# Patient Record
Sex: Female | Born: 1940
Health system: Southern US, Community
[De-identification: ages and names within clinical notes are randomized; demographics above are authoritative.]

## PROBLEM LIST (undated history)

## (undated) DIAGNOSIS — Z8719 Personal history of other diseases of the digestive system: Secondary | ICD-10-CM

## (undated) DIAGNOSIS — I10 Essential (primary) hypertension: Secondary | ICD-10-CM

## (undated) DIAGNOSIS — E785 Hyperlipidemia, unspecified: Secondary | ICD-10-CM

## (undated) DIAGNOSIS — M810 Age-related osteoporosis without current pathological fracture: Secondary | ICD-10-CM

## (undated) DIAGNOSIS — G4733 Obstructive sleep apnea (adult) (pediatric): Secondary | ICD-10-CM

## (undated) DIAGNOSIS — E039 Hypothyroidism, unspecified: Secondary | ICD-10-CM

## (undated) DIAGNOSIS — G4730 Sleep apnea, unspecified: Secondary | ICD-10-CM

## (undated) DIAGNOSIS — I701 Atherosclerosis of renal artery: Secondary | ICD-10-CM

## (undated) DIAGNOSIS — T7840XA Allergy, unspecified, initial encounter: Secondary | ICD-10-CM

## (undated) DIAGNOSIS — H3530 Unspecified macular degeneration: Secondary | ICD-10-CM

## (undated) HISTORY — DX: Essential (primary) hypertension: I10

## (undated) HISTORY — DX: Personal history of other diseases of the digestive system: Z87.19

## (undated) HISTORY — DX: Obstructive sleep apnea (adult) (pediatric): G47.33

## (undated) HISTORY — PX: COLONOSCOPY: SHX174

## (undated) HISTORY — DX: Age-related osteoporosis without current pathological fracture: M81.0

## (undated) HISTORY — DX: Allergy, unspecified, initial encounter: T78.40XA

## (undated) HISTORY — DX: Sleep apnea, unspecified: G47.30

## (undated) HISTORY — DX: Atherosclerosis of renal artery: I70.1

## (undated) HISTORY — DX: Unspecified macular degeneration: H35.30

## (undated) HISTORY — DX: Hyperlipidemia, unspecified: E78.5

## (undated) HISTORY — DX: Hypothyroidism, unspecified: E03.9

---
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
## 1978-09-13 DIAGNOSIS — Z8711 Personal history of peptic ulcer disease: Secondary | ICD-10-CM

## 1978-09-13 HISTORY — DX: Personal history of peptic ulcer disease: Z87.11

## 1980-09-13 HISTORY — PX: LAPAROSCOPY: SHX197

## 2005-09-13 HISTORY — PX: THYROIDECTOMY: SHX17

## 2011-11-17 DIAGNOSIS — E039 Hypothyroidism, unspecified: Secondary | ICD-10-CM | POA: Diagnosis not present

## 2011-11-17 DIAGNOSIS — R209 Unspecified disturbances of skin sensation: Secondary | ICD-10-CM | POA: Diagnosis not present

## 2011-11-17 DIAGNOSIS — E785 Hyperlipidemia, unspecified: Secondary | ICD-10-CM | POA: Diagnosis not present

## 2011-11-17 DIAGNOSIS — E559 Vitamin D deficiency, unspecified: Secondary | ICD-10-CM | POA: Diagnosis not present

## 2011-11-22 DIAGNOSIS — E559 Vitamin D deficiency, unspecified: Secondary | ICD-10-CM | POA: Diagnosis not present

## 2011-11-22 DIAGNOSIS — R799 Abnormal finding of blood chemistry, unspecified: Secondary | ICD-10-CM | POA: Diagnosis not present

## 2011-11-22 DIAGNOSIS — E785 Hyperlipidemia, unspecified: Secondary | ICD-10-CM | POA: Diagnosis not present

## 2011-11-22 DIAGNOSIS — R209 Unspecified disturbances of skin sensation: Secondary | ICD-10-CM | POA: Diagnosis not present

## 2011-12-21 DIAGNOSIS — F331 Major depressive disorder, recurrent, moderate: Secondary | ICD-10-CM | POA: Diagnosis not present

## 2012-01-12 DIAGNOSIS — F331 Major depressive disorder, recurrent, moderate: Secondary | ICD-10-CM | POA: Diagnosis not present

## 2012-01-12 DIAGNOSIS — G609 Hereditary and idiopathic neuropathy, unspecified: Secondary | ICD-10-CM | POA: Diagnosis not present

## 2012-01-19 DIAGNOSIS — F329 Major depressive disorder, single episode, unspecified: Secondary | ICD-10-CM | POA: Diagnosis not present

## 2012-01-19 DIAGNOSIS — R209 Unspecified disturbances of skin sensation: Secondary | ICD-10-CM | POA: Diagnosis not present

## 2012-01-19 DIAGNOSIS — I10 Essential (primary) hypertension: Secondary | ICD-10-CM | POA: Diagnosis not present

## 2012-01-19 DIAGNOSIS — F3289 Other specified depressive episodes: Secondary | ICD-10-CM | POA: Diagnosis not present

## 2012-01-19 DIAGNOSIS — E039 Hypothyroidism, unspecified: Secondary | ICD-10-CM | POA: Diagnosis not present

## 2012-02-02 DIAGNOSIS — E039 Hypothyroidism, unspecified: Secondary | ICD-10-CM | POA: Diagnosis not present

## 2012-02-02 DIAGNOSIS — F3289 Other specified depressive episodes: Secondary | ICD-10-CM | POA: Diagnosis not present

## 2012-02-02 DIAGNOSIS — R209 Unspecified disturbances of skin sensation: Secondary | ICD-10-CM | POA: Diagnosis not present

## 2012-02-02 DIAGNOSIS — I10 Essential (primary) hypertension: Secondary | ICD-10-CM | POA: Diagnosis not present

## 2012-02-02 DIAGNOSIS — G839 Paralytic syndrome, unspecified: Secondary | ICD-10-CM | POA: Diagnosis not present

## 2012-02-02 DIAGNOSIS — F329 Major depressive disorder, single episode, unspecified: Secondary | ICD-10-CM | POA: Diagnosis not present

## 2012-02-29 DIAGNOSIS — E559 Vitamin D deficiency, unspecified: Secondary | ICD-10-CM | POA: Diagnosis not present

## 2012-02-29 DIAGNOSIS — F331 Major depressive disorder, recurrent, moderate: Secondary | ICD-10-CM | POA: Diagnosis not present

## 2012-04-12 DIAGNOSIS — H16209 Unspecified keratoconjunctivitis, unspecified eye: Secondary | ICD-10-CM | POA: Diagnosis not present

## 2012-04-18 DIAGNOSIS — H16209 Unspecified keratoconjunctivitis, unspecified eye: Secondary | ICD-10-CM | POA: Diagnosis not present

## 2012-05-11 ENCOUNTER — Other Ambulatory Visit: Payer: Self-pay | Admitting: Family Medicine

## 2012-05-11 DIAGNOSIS — Z1231 Encounter for screening mammogram for malignant neoplasm of breast: Secondary | ICD-10-CM

## 2012-06-02 ENCOUNTER — Ambulatory Visit
Admission: RE | Admit: 2012-06-02 | Discharge: 2012-06-02 | Disposition: A | Discharge status: Home / Self Care | Payer: Medicare Other | Source: Ambulatory Visit | Attending: Family Medicine | Admitting: Family Medicine

## 2012-06-02 DIAGNOSIS — Z1231 Encounter for screening mammogram for malignant neoplasm of breast: Secondary | ICD-10-CM | POA: Diagnosis not present

## 2012-08-24 ENCOUNTER — Ambulatory Visit (INDEPENDENT_AMBULATORY_CARE_PROVIDER_SITE_OTHER): Payer: Medicare Other | Admitting: Internal Medicine

## 2012-08-24 ENCOUNTER — Encounter: Payer: Self-pay | Admitting: Internal Medicine

## 2012-08-24 VITALS — BP 150/90 | HR 86 | Temp 98.0°F | Resp 18 | Ht 63.75 in | Wt 169.0 lb

## 2012-08-24 DIAGNOSIS — Z0000 Encounter for general adult medical examination without abnormal findings: Secondary | ICD-10-CM

## 2012-08-24 DIAGNOSIS — I10 Essential (primary) hypertension: Secondary | ICD-10-CM | POA: Diagnosis not present

## 2012-08-24 DIAGNOSIS — E039 Hypothyroidism, unspecified: Secondary | ICD-10-CM

## 2012-08-24 DIAGNOSIS — E559 Vitamin D deficiency, unspecified: Secondary | ICD-10-CM

## 2012-08-24 MED ORDER — AMLODIPINE BESYLATE 2.5 MG PO TABS: 2.5000 mg | ORAL_TABLET | Freq: Every day | ORAL | Status: DC

## 2012-08-24 MED ORDER — ZOLPIDEM TARTRATE 5 MG PO TABS: 5.0000 mg | ORAL_TABLET | Freq: Every evening | ORAL | Status: DC | PRN

## 2012-08-24 MED ORDER — QUINAPRIL HCL 40 MG PO TABS: 40.0000 mg | ORAL_TABLET | Freq: Every day | ORAL | Status: DC

## 2012-08-24 MED ORDER — PRAVASTATIN SODIUM 20 MG PO TABS: 20.0000 mg | ORAL_TABLET | Freq: Every day | ORAL | Status: DC

## 2012-08-24 MED ORDER — LEVOTHYROXINE SODIUM 125 MCG PO TABS: 125.0000 ug | ORAL_TABLET | Freq: Every day | ORAL | Status: DC

## 2012-08-24 MED ORDER — HYDROCHLOROTHIAZIDE 25 MG PO TABS: 25.0000 mg | ORAL_TABLET | Freq: Every day | ORAL | Status: DC

## 2012-08-24 NOTE — Patient Instructions (Signed)
Limit your sodium (Salt) intake  You need to lose weight.  Consider a lower calorie diet and regular exercise.  Please check your blood pressure on a regular basis.  If it is consistently greater than 150/90, please make an office appointment.  Return in 3 months for follow-up  Insomnia Insomnia is frequent trouble falling and/or staying asleep. Insomnia can be a long term problem or a short term problem. Both are common. Insomnia can be a short term problem when the wakefulness is related to a certain stress or worry. Long term insomnia is often related to ongoing stress during waking hours and/or poor sleeping habits. Overtime, sleep deprivation itself can make the problem worse. Every little thing feels more severe because you are overtired and your ability to cope is decreased. CAUSES    Stress, anxiety, and depression.   Poor sleeping habits.   Distractions such as TV in the bedroom.   Naps close to bedtime.   Engaging in emotionally charged conversations before bed.   Technical reading before sleep.   Alcohol and other sedatives. They may make the problem worse. They can hurt normal sleep patterns and normal dream activity.   Stimulants such as caffeine for several hours prior to bedtime.   Pain syndromes and shortness of breath can cause insomnia.   Exercise late at night.   Changing time zones may cause sleeping problems (jet lag).  It is sometimes helpful to have someone observe your sleeping patterns. They should look for periods of not breathing during the night (sleep apnea). They should also look to see how long those periods last. If you live alone or observers are uncertain, you can also be observed at a sleep clinic where your sleep patterns will be professionally monitored. Sleep apnea requires a checkup and treatment. Give your caregivers your medical history. Give your caregivers observations your family has made about your sleep.   SYMPTOMS    Not feeling  rested in the morning.   Anxiety and restlessness at bedtime.   Difficulty falling and staying asleep.  TREATMENT    Your caregiver may prescribe treatment for an underlying medical disorders. Your caregiver can give advice or help if you are using alcohol or other drugs for self-medication. Treatment of underlying problems will usually eliminate insomnia problems.   Medications can be prescribed for short time use. They are generally not recommended for lengthy use.   Over-the-counter sleep medicines are not recommended for lengthy use. They can be habit forming.   You can promote easier sleeping by making lifestyle changes such as:   Using relaxation techniques that help with breathing and reduce muscle tension.   Exercising earlier in the day.   Changing your diet and the time of your last meal. No night time snacks.   Establish a regular time to go to bed.   Counseling can help with stressful problems and worry.   Soothing music and Dragon noise may be helpful if there are background noises you cannot remove.   Stop tedious detailed work at least one hour before bedtime.  HOME CARE INSTRUCTIONS    Keep a diary. Inform your caregiver about your progress. This includes any medication side effects. See your caregiver regularly. Take note of:   Times when you are asleep.   Times when you are awake during the night.   The quality of your sleep.   How you feel the next day.  This information will help your caregiver care for you.  Get out  of bed if you are still awake after 15 minutes. Read or do some quiet activity. Keep the lights down. Wait until you feel sleepy and go back to bed.   Keep regular sleeping and waking hours. Avoid naps.   Exercise regularly.   Avoid distractions at bedtime. Distractions include watching television or engaging in any intense or detailed activity like attempting to balance the household checkbook.   Develop a bedtime ritual. Keep a  familiar routine of bathing, brushing your teeth, climbing into bed at the same time each night, listening to soothing music. Routines increase the success of falling to sleep faster.   Use relaxation techniques. This can be using breathing and muscle tension release routines. It can also include visualizing peaceful scenes. You can also help control troubling or intruding thoughts by keeping your mind occupied with boring or repetitive thoughts like the old concept of counting sheep. You can make it more creative like imagining planting one beautiful flower after another in your backyard garden.   During your day, work to eliminate stress. When this is not possible use some of the previous suggestions to help reduce the anxiety that accompanies stressful situations.  MAKE SURE YOU:    Understand these instructions.   Will watch your condition.   Will get help right away if you are not doing well or get worse.  Document Released: 08/27/2000 Document Revised: 11/22/2011 Document Reviewed: 09/27/2007 [facility] Patient Information 2013 [place], [place].

## 2012-08-24 NOTE — Progress Notes (Signed)
Subjective:    Patient ID: Teresa Rangel, female    DOB: 12-31-1940, 71 y.o.   MRN: [id number]  HPI 70 year old patient who is seen today to set [place] with our practice. She has long history of hypertension that has been controlled on 2 drugs. She states that blood pressure readings especially systolics have been consistently elevated. She states that she has a remote history of fibromyalgia and restless leg syndrome he complains of intermittent insomnia. She also complains of dizziness and nasal stuffiness. Dizziness sounds like brief vertigo aggravated by head turning. Past medical history she has had thyroid surgery for benign tumor and a remote appendectomy in 1955 she had a colonoscopy 7 years ago. She has remote history depression.  Family history father died at 29 complications of COPD and coronary artery disease Mother died at 66. In good health and died in a motor vehicle accident. Paternal grandmother with diabetes one brother in good health one sister with cerebrovascular disease  Social history relocated from [place] about one year ago. Retired the high school Warden/ranger. Nonsmoker  1. Risk factors, based on past  M,S,F history-  cardiovascular risk factors include a history of hypertension 2.  Physical activities: Remains quite active does supper to spread add a health club 3 times weekly  3.  Depression/mood: Remote history depression has been on Zoloft briefly in the past  4.  Hearing: No deficits  5.  ADL's: Independent in all aspects of daily living  6.  Fall risk: Low   7.  Home safety: No problems identified  8.  Height weight, and visual acuity; height and weight stable no change in visual acuity  9.  Counseling: Not appropriate at this time  10. Lab orders based on risk factors: Will review in the spring  11. Referral : Not appropriate at this time  12. Care plan: We'll add amlodipine to her blood pressure regimen continue home blood pressure monitoring 13.  Cognitive assessment: Alert and oriented normal affect. No cognitive dysfunction    Review of Systems  Constitutional: Negative for fever, appetite change, fatigue and unexpected weight change.  HENT: Positive for congestion and sinus pressure. Negative for hearing loss, ear pain, nosebleeds, sore throat, mouth sores, trouble swallowing, neck stiffness, dental problem, voice change and tinnitus.   Eyes: Negative for photophobia, pain, redness and visual disturbance.  Respiratory: Negative for cough, chest tightness and shortness of breath.   Cardiovascular: Negative for chest pain, palpitations and leg swelling.  Gastrointestinal: Negative for nausea, vomiting, abdominal pain, diarrhea, constipation, blood in stool, abdominal distention and rectal pain.  Genitourinary: Negative for dysuria, urgency, frequency, hematuria, flank pain, vaginal bleeding, vaginal discharge, difficulty urinating, genital sores, vaginal pain, menstrual problem and pelvic pain.  Musculoskeletal: Negative for back pain and arthralgias.  Skin: Negative for rash.  Neurological: Positive for light-headedness. Negative for dizziness, syncope, speech difficulty, weakness, numbness and headaches.  Hematological: Negative for adenopathy. Does not bruise/bleed easily.  Psychiatric/Behavioral: Positive for sleep disturbance. Negative for suicidal ideas, behavioral problems, self-injury, dysphoric mood and agitation. The patient is not nervous/anxious.        Objective:   Physical Exam  Constitutional: She is oriented to person, place, and time. She appears well-developed and well-nourished.  HENT:  Head: Normocephalic.  Right Ear: External ear normal.  Left Ear: External ear normal.  Mouth/Throat: Oropharynx is clear and moist.  Eyes: Conjunctivae normal and EOM are normal. Pupils are equal, round, and reactive to light.  Neck: Normal range of motion.  Neck supple. No thyromegaly present.       Thyroidectomy scar   Cardiovascular: Normal rate, regular rhythm, normal heart sounds and intact distal pulses.   Pulmonary/Chest: Effort normal and breath sounds normal.  Abdominal: Soft. Bowel sounds are normal. She exhibits no mass. There is no tenderness.  Musculoskeletal: Normal range of motion.  Lymphadenopathy:    She has no cervical adenopathy.  Neurological: She is alert and oriented to person, place, and time.  Skin: Skin is warm and dry. No rash noted.  Psychiatric: She has a normal mood and affect. Her behavior is normal.          Assessment & Plan:  Preventive health exam Hypertension suboptimal control we'll add amlodipine 2.5 mg daily Intermittent vertigo in the setting of nasal congestion will give a trial of Nasacort. Insomnia. This is episodic will treat with the Ambien 5 when necessary  Recheck 3 months. Laboratory update at that time home blood pressure monitoring encouraged

## 2012-10-18 ENCOUNTER — Telehealth: Payer: Self-pay | Admitting: *Deleted

## 2012-10-18 NOTE — Telephone Encounter (Signed)
Left message on voicemail. Received request from pharmacy [facility] to change Quinapril to Lisinopril due to cost.

## 2012-10-19 NOTE — Telephone Encounter (Signed)
Spoke to pt stated she does not need to change medication, the pharmacist was wrong she got Quinapril Rx. Told pt okay.

## 2012-11-22 ENCOUNTER — Ambulatory Visit: Payer: PRIVATE HEALTH INSURANCE | Admitting: Internal Medicine

## 2012-12-07 ENCOUNTER — Ambulatory Visit (INDEPENDENT_AMBULATORY_CARE_PROVIDER_SITE_OTHER): Payer: Medicare Other | Admitting: Internal Medicine

## 2012-12-07 ENCOUNTER — Encounter: Payer: Self-pay | Admitting: Internal Medicine

## 2012-12-07 VITALS — BP 150/90 | HR 79 | Temp 98.6°F | Resp 18 | Wt 175.0 lb

## 2012-12-07 DIAGNOSIS — I10 Essential (primary) hypertension: Secondary | ICD-10-CM | POA: Diagnosis not present

## 2012-12-07 DIAGNOSIS — E785 Hyperlipidemia, unspecified: Secondary | ICD-10-CM | POA: Diagnosis not present

## 2012-12-07 DIAGNOSIS — E039 Hypothyroidism, unspecified: Secondary | ICD-10-CM

## 2012-12-07 LAB — CBC WITH DIFFERENTIAL/PLATELET
Basophils Absolute: 0 10*3/uL (ref 0.0–0.1)
Eosinophils Absolute: 0.3 10*3/uL (ref 0.0–0.7)
HCT: 43.4 % (ref 36.0–46.0)
Lymphs Abs: 1.7 10*3/uL (ref 0.7–4.0)
MCHC: 34.3 g/dL (ref 30.0–36.0)
Monocytes Absolute: 0.5 10*3/uL (ref 0.1–1.0)
Monocytes Relative: 8.4 % (ref 3.0–12.0)
Platelets: 235 10*3/uL (ref 150.0–400.0)
RDW: 12.3 % (ref 11.5–14.6)

## 2012-12-07 LAB — LDL CHOLESTEROL, DIRECT: Direct LDL: 133.1 mg/dL

## 2012-12-07 LAB — LIPID PANEL
HDL: 50.6 mg/dL (ref >39.00)
Total CHOL/HDL Ratio: 4
VLDL: 33.2 mg/dL (ref 0.0–40.0)

## 2012-12-07 LAB — COMPREHENSIVE METABOLIC PANEL
ALT: 20 U/L (ref 0–35)
CO2: 30 mEq/L (ref 19–32)
Chloride: 100 mEq/L (ref 96–112)
GFR: 80.53 mL/min (ref >60.00)
Sodium: 137 mEq/L (ref 135–145)
Total Bilirubin: 0.8 mg/dL (ref 0.3–1.2)
Total Protein: 7.1 g/dL (ref 6.0–8.3)

## 2012-12-07 MED ORDER — ZOLPIDEM TARTRATE 5 MG PO TABS: 5.0000 mg | ORAL_TABLET | Freq: Every evening | ORAL | Status: DC | PRN

## 2012-12-07 MED ORDER — HYDROCHLOROTHIAZIDE 25 MG PO TABS: 25.0000 mg | ORAL_TABLET | Freq: Every day | ORAL | Status: DC

## 2012-12-07 MED ORDER — AMLODIPINE BESYLATE 5 MG PO TABS: 5.0000 mg | ORAL_TABLET | Freq: Every day | ORAL | Status: DC

## 2012-12-07 MED ORDER — QUINAPRIL HCL 40 MG PO TABS: 40.0000 mg | ORAL_TABLET | Freq: Every day | ORAL | Status: DC

## 2012-12-07 MED ORDER — PRAVASTATIN SODIUM 20 MG PO TABS: 20.0000 mg | ORAL_TABLET | Freq: Every day | ORAL | Status: DC

## 2012-12-07 MED ORDER — LEVOTHYROXINE SODIUM 125 MCG PO TABS: 125.0000 ug | ORAL_TABLET | Freq: Every day | ORAL | Status: DC

## 2012-12-07 NOTE — Patient Instructions (Signed)
Take a calcium supplement, plus [phone] units of vitamin D    It is important that you exercise regularly, at least 20 minutes 3 to 4 times per week.  If you develop chest pain or shortness of breath seek  medical attention.  Limit your sodium (Salt) intake  Please check your blood pressure on a regular basis.  If it is consistently greater than 150/90, please make an office appointment.

## 2012-12-07 NOTE — Progress Notes (Signed)
Subjective:    Patient ID: Teresa Rangel, female    DOB: May 08, 1941, 72 y.o.   MRN: [id number]  HPI  72 year old patient who is seen today for followup. Medical problems include hypertension. Last visit her blood pressure was mildly elevated and amlodipine 2.5 mg added to her regimen. Blood pressure still in the 150/90 range. She is also on hydrochlorothiazide and Accupril. She has insomnia which has done much better with when necessary use of Ambien. She has hypothyroidism and is status post partial thyroidectomy for benign tumor. She has a history of vitamin D deficiency  History reviewed. No pertinent past medical history.  History   Social History  . Marital Status: Married    Spouse Name: N/A    Number of Children: N/A  . Years of Education: N/A   Occupational History  . Not on file.   Social History Main Topics  . Smoking status: Never Smoker   . Smokeless tobacco: Never Used  . Alcohol Use: 0.6 oz/week    1 Glasses of wine per week  . Drug Use: No  . Sexually Active: Yes -- Female partner(s)   Other Topics Concern  . Not on file   Social History Narrative  . No narrative on file    History reviewed. No pertinent past surgical history.  No family history on file.  No Known Allergies  Current Outpatient Prescriptions on File Prior to Visit  Medication Sig Dispense Refill  . amLODipine (NORVASC) 2.5 MG tablet Take 1 tablet (2.5 mg total) by mouth daily.  90 tablet  3  . cholecalciferol (VITAMIN D) 1000 UNITS tablet Take 2,000 Units by mouth daily.      . hydrochlorothiazide (HYDRODIURIL) 25 MG tablet Take 1 tablet (25 mg total) by mouth daily.  90 tablet  6  . levothyroxine (SYNTHROID, LEVOTHROID) 125 MCG tablet Take 1 tablet (125 mcg total) by mouth daily.  90 tablet  6  . pravastatin (PRAVACHOL) 20 MG tablet Take 1 tablet (20 mg total) by mouth daily.  90 tablet  6  . quinapril (ACCUPRIL) 40 MG tablet Take 1 tablet (40 mg total) by mouth at bedtime.  90 tablet  6    No current facility-administered medications on file prior to visit.    BP 150/90  Pulse 79  Temp(Src) 98.6 F (37 C) (Oral)  Resp 18  Wt 175 lb (79.379 kg)  BMI 30.28 kg/m2  SpO2 97%       Review of Systems  Constitutional: Negative.   HENT: Negative for hearing loss, congestion, sore throat, rhinorrhea, dental problem, sinus pressure and tinnitus.   Eyes: Negative for pain, discharge and visual disturbance.  Respiratory: Negative for cough and shortness of breath.   Cardiovascular: Negative for chest pain, palpitations and leg swelling.  Gastrointestinal: Negative for nausea, vomiting, abdominal pain, diarrhea, constipation, blood in stool and abdominal distention.  Genitourinary: Negative for dysuria, urgency, frequency, hematuria, flank pain, vaginal bleeding, vaginal discharge, difficulty urinating, vaginal pain and pelvic pain.  Musculoskeletal: Negative for joint swelling, arthralgias and gait problem.  Skin: Negative for rash.  Neurological: Negative for dizziness, syncope, speech difficulty, weakness, numbness and headaches.  Hematological: Negative for adenopathy.  Psychiatric/Behavioral: Negative for behavioral problems, dysphoric mood and agitation. The patient is not nervous/anxious.        Objective:   Physical Exam  Constitutional: She is oriented to person, place, and time. She appears well-developed and well-nourished.  HENT:  Head: Normocephalic.  Right Ear: External ear normal.  Left  Ear: External ear normal.  Mouth/Throat: Oropharynx is clear and moist.  Eyes: Conjunctivae and EOM are normal. Pupils are equal, round, and reactive to light.  Neck: Normal range of motion. Neck supple. No thyromegaly present.  Thyroidectomy scar  Cardiovascular: Normal rate, regular rhythm, normal heart sounds and intact distal pulses.   Pulmonary/Chest: Effort normal and breath sounds normal.  Abdominal: Soft. Bowel sounds are normal. She exhibits no mass. There is  no tenderness.  Musculoskeletal: Normal range of motion.  Lymphadenopathy:    She has no cervical adenopathy.  Neurological: She is alert and oriented to person, place, and time.  Skin: Skin is warm and dry. No rash noted.  Psychiatric: She has a normal mood and affect. Her behavior is normal.          Assessment & Plan:   Hypertension. Blood pressure still mildly elevated. We'll increase amlodipine to 5 mg daily Hypothyroidism. We'll check a TSH Dyslipidemia. A lipid profile will be reviewed Insomnia improved  All medications refilled

## 2013-01-01 ENCOUNTER — Encounter (HOSPITAL_COMMUNITY): Payer: Self-pay

## 2013-01-01 ENCOUNTER — Emergency Department (HOSPITAL_COMMUNITY)
Admission: EM | Admit: 2013-01-01 | Discharge: 2013-01-01 | Disposition: A | Discharge status: Home / Self Care | Payer: Medicare Other | Attending: Emergency Medicine | Admitting: Emergency Medicine

## 2013-01-01 ENCOUNTER — Other Ambulatory Visit: Payer: Self-pay

## 2013-01-01 ENCOUNTER — Emergency Department (HOSPITAL_COMMUNITY): Payer: Medicare Other

## 2013-01-01 DIAGNOSIS — K7689 Other specified diseases of liver: Secondary | ICD-10-CM | POA: Diagnosis not present

## 2013-01-01 DIAGNOSIS — R112 Nausea with vomiting, unspecified: Secondary | ICD-10-CM | POA: Diagnosis not present

## 2013-01-01 DIAGNOSIS — R1013 Epigastric pain: Secondary | ICD-10-CM | POA: Diagnosis not present

## 2013-01-01 DIAGNOSIS — R079 Chest pain, unspecified: Secondary | ICD-10-CM | POA: Diagnosis not present

## 2013-01-01 DIAGNOSIS — E039 Hypothyroidism, unspecified: Secondary | ICD-10-CM | POA: Insufficient documentation

## 2013-01-01 DIAGNOSIS — E785 Hyperlipidemia, unspecified: Secondary | ICD-10-CM | POA: Insufficient documentation

## 2013-01-01 DIAGNOSIS — Z79899 Other long term (current) drug therapy: Secondary | ICD-10-CM | POA: Diagnosis not present

## 2013-01-01 DIAGNOSIS — Z9089 Acquired absence of other organs: Secondary | ICD-10-CM | POA: Insufficient documentation

## 2013-01-01 DIAGNOSIS — I10 Essential (primary) hypertension: Secondary | ICD-10-CM | POA: Insufficient documentation

## 2013-01-01 LAB — CBC
HCT: 43.9 % (ref 36.0–46.0)
Hemoglobin: 15.5 g/dL — ABNORMAL HIGH (ref 12.0–15.0)
MCH: 29.2 pg (ref 26.0–34.0)
MCHC: 35.3 g/dL (ref 30.0–36.0)
MCV: 82.8 fL (ref 78.0–100.0)
Platelets: 211 K/uL (ref 150–400)
RBC: 5.3 MIL/uL — ABNORMAL HIGH (ref 3.87–5.11)
RDW: 12.3 % (ref 11.5–15.5)
WBC: 6.7 10*3/uL (ref 4.0–10.5)

## 2013-01-01 LAB — BASIC METABOLIC PANEL
CO2: 28 mEq/L (ref 19–32)
Calcium: 9.8 mg/dL (ref 8.4–10.5)
Chloride: 103 mEq/L (ref 96–112)
Creatinine, Ser: 0.76 mg/dL (ref 0.50–1.10)
Glucose, Bld: 133 mg/dL — ABNORMAL HIGH (ref 70–99)
Sodium: 141 mEq/L (ref 135–145)

## 2013-01-01 LAB — HEPATIC FUNCTION PANEL
ALT: 21 U/L (ref 0–35)
AST: 22 U/L (ref 0–37)
Albumin: 3.8 g/dL (ref 3.5–5.2)
Alkaline Phosphatase: 80 U/L (ref 39–117)
Bilirubin, Direct: <0.1 mg/dL (ref 0.0–0.3)
Total Bilirubin: 0.4 mg/dL (ref 0.3–1.2)
Total Protein: 7.1 g/dL (ref 6.0–8.3)

## 2013-01-01 LAB — BASIC METABOLIC PANEL WITH GFR
BUN: 13 mg/dL (ref 6–23)
GFR calc Af Amer: >90 mL/min (ref >90)
GFR calc non Af Amer: 82 mL/min — ABNORMAL LOW (ref >90)
Potassium: 3.1 meq/L — ABNORMAL LOW (ref 3.5–5.1)

## 2013-01-01 LAB — POCT I-STAT TROPONIN I: Troponin i, poc: 0.02 ng/mL (ref 0.00–0.08)

## 2013-01-01 LAB — PRO B NATRIURETIC PEPTIDE: Pro B Natriuretic peptide (BNP): 77.3 pg/mL (ref 0–125)

## 2013-01-01 LAB — LIPASE, BLOOD: Lipase: 51 U/L (ref 11–59)

## 2013-01-01 MED ORDER — HYDROMORPHONE HCL PF 1 MG/ML IJ SOLN
0.5000 mg | Freq: Once | INTRAMUSCULAR | Status: AC
  Administered 2013-01-01: 01:00:00 via INTRAVENOUS
  Filled 2013-01-01: qty 1

## 2013-01-01 MED ORDER — ASPIRIN 325 MG PO TABS: 325.0000 mg | ORAL_TABLET | ORAL | Status: DC

## 2013-01-01 MED ORDER — ONDANSETRON HCL 4 MG/2ML IJ SOLN
4.0000 mg | Freq: Once | INTRAMUSCULAR | Status: AC
  Administered 2013-01-01: 4 mg via INTRAVENOUS
  Filled 2013-01-01: qty 2

## 2013-01-01 MED ORDER — PROMETHAZINE HCL 25 MG PO TABS: 25.0000 mg | ORAL_TABLET | Freq: Four times a day (QID) | ORAL | Status: DC | PRN

## 2013-01-01 MED ORDER — NITROGLYCERIN 0.4 MG SL SUBL: 0.4000 mg | SUBLINGUAL_TABLET | SUBLINGUAL | Status: DC | PRN

## 2013-01-01 NOTE — ED Notes (Signed)
Pt to the ED with c/o chest discomfort for the past two hours. Pt was awoken from sleep, pain is non-radiating. Pt reports shortness of breath, nausea, denies diaphoresis. Pt stats her blood pressure was 192/98 at home. Pt is compliant with blood pressure medications. Pt's respirations are equal and unlabored, skin warm and dry, and pt is A&Ox4.

## 2013-01-01 NOTE — ED Provider Notes (Signed)
History     CSN: [id number]  Arrival date & time 01/01/13  0036   First MD Initiated Contact with Patient 01/01/13 0054      Chief Complaint  Patient presents with  . Chest Pain    (Consider location/radiation/quality/duration/timing/severity/associated sxs/prior treatment) HPI Comments: Patient reports that she did eat a lot of lunch and dinner occurring at both 1:00 and at 7:30 this evening. She reports she went to bed at approximately 10:30 PM feeling rather bloated and a little bit uncomfortable in her abdomen. She woke up about an hour later with pretty significant severe pain in her upper epigastrium and in her middle anterior chest wall. She denies any significant shortness of breath, did get nauseated without any vomiting. She reports she feels like she needs to vomit to feel better. She denies skin rash, recent cough or cold symptoms. She rep reports a history of appendectomy when she was 72 years old. She denies recent diarrhea, constipation, blood in her stool. She denies prior history of cardiac disease. She does not feel faint, denies dizziness, did not have an unusual sweats. She denies any alcohol use this evening. She has significant history of hypertension, hypothyroidism, hyperlipidemia. Level V caveat due to patient condition  Patient is a 72 y.o. female presenting with chest pain. The history is provided by the patient.  Chest Pain   No past medical history on file.  No past surgical history on file.  No family history on file.  History  Substance Use Topics  . Smoking status: Never Smoker   . Smokeless tobacco: Never Used  . Alcohol Use: 0.6 oz/week    1 Glasses of wine per week    OB History   Grav Para Term Preterm Abortions TAB SAB Ect Mult Living                  Review of Systems  Unable to perform ROS: Acuity of condition  Cardiovascular: Positive for chest pain.    Allergies  Review of patient's allergies indicates no known allergies.  Home  Medications   Current Outpatient Rx  Name  Route  Sig  Dispense  Refill  . amLODipine (NORVASC) 5 MG tablet   Oral   Take 1 tablet (5 mg total) by mouth daily.   90 tablet   3   . cholecalciferol (VITAMIN D) 1000 UNITS tablet   Oral   Take 2,000 Units by mouth daily.         . hydrochlorothiazide (HYDRODIURIL) 25 MG tablet   Oral   Take 1 tablet (25 mg total) by mouth daily.   90 tablet   6   . levothyroxine (SYNTHROID, LEVOTHROID) 125 MCG tablet   Oral   Take 1 tablet (125 mcg total) by mouth daily.   90 tablet   6   . pravastatin (PRAVACHOL) 20 MG tablet   Oral   Take 1 tablet (20 mg total) by mouth daily.   90 tablet   6   . quinapril (ACCUPRIL) 40 MG tablet   Oral   Take 1 tablet (40 mg total) by mouth at bedtime.   90 tablet   6   . vitamin C (ASCORBIC ACID) 500 MG tablet   Oral   Take 500 mg by mouth daily.         Marland Kitchen zolpidem (AMBIEN) 5 MG tablet   Oral   Take 1 tablet (5 mg total) by mouth at bedtime as needed for sleep.   15  tablet   3   . promethazine (PHENERGAN) 25 MG tablet   Oral   Take 1 tablet (25 mg total) by mouth every 6 (six) hours as needed for nausea.   20 tablet   0     BP 181/76  Pulse 82  Temp(Src) 97.6 F (36.4 C) (Oral)  Resp 18  Ht 5\' 4"  (1.626 m)  Wt 169 lb (76.658 kg)  BMI 28.99 kg/m2  SpO2 100%  Physical Exam  Nursing note and vitals reviewed. Constitutional: She appears well-developed and well-nourished. She appears listless.  Non-toxic appearance. She appears distressed.  Minimally clammy in appearance. Patient prefers to lay still, speech quietly with eyes closed  HENT:  Head: Normocephalic and atraumatic.  Eyes: EOM are normal.  Neck: Normal range of motion. Neck supple.  Cardiovascular: Normal rate and regular rhythm.   No murmur heard. Pulmonary/Chest: Effort normal. No respiratory distress.  Abdominal: Soft. Normal appearance. Bowel sounds are decreased. There is tenderness in the right upper quadrant  and epigastric area. There is rebound and guarding. There is no tenderness at McBurney's point.  Neurological: She appears listless.  Skin: Skin is warm.    ED Course  Procedures (including critical care time)  Labs Reviewed  CBC - Abnormal; Notable for the following:    RBC 5.30 (*)    Hemoglobin 15.5 (*)    All other components within normal limits  BASIC METABOLIC PANEL - Abnormal; Notable for the following:    Potassium 3.1 (*)    Glucose, Bld 133 (*)    GFR calc non Af Amer 82 (*)    All other components within normal limits  PRO B NATRIURETIC PEPTIDE  HEPATIC FUNCTION PANEL  LIPASE, BLOOD  POCT I-STAT TROPONIN I   US Abdomen Complete  01/01/2013  *RADIOLOGY REPORT*  Clinical Data:  Abdominal pain  COMPLETE ABDOMINAL ULTRASOUND  Comparison:  None.  Findings:  Gallbladder:  No gallstones, gallbladder wall thickening, or pericholecystic fluid.  Common bile duct:  Poorly visualized due to bowel gas artifact. Proximally measures 5 mm.  Liver:  Increase in echogenicity.  Focal lesion detection is limited in the setting.  IVC:  Appears normal.  Pancreas:  Poorly visualized due to bowel gas artifact/limited acoustic windows.  Spleen:  Measures 11 cm.  No focal abnormality.  Right Kidney:  Measures 11 cm.  No hydronephrosis or focal abnormality.  Left Kidney:  Measures 11 cm.  No hydronephrosis or focal abnormality.  Abdominal aorta:  Portions of the aorta are incompletely visualized due to limited acoustic windows.  IMPRESSION: Technically challenging examination due to limited acoustic windows/bowel gas artifact, including incomplete visualization of the CBD, pancreas, and aorta.  No gallstones or sonographic evidence for cholecystitis.  Hepatic steatosis.   Original Report Authenticated By: Jearld Lesch, M.D.    Dg Chest Port 1 View  01/01/2013  *RADIOLOGY REPORT*  Clinical Data: Chest pain  PORTABLE CHEST - 1 VIEW  Comparison: None.  Findings: Lungs predominately clear.  No pleural  effusion or pneumothorax.  Mild aortic tortuosity and atherosclerosis.  Heart size within normal range. Surgical clips project over the midline thoracic inlet/neck.  No acute osseous finding.  IMPRESSION: No radiographic evidence of acute cardiopulmonary process.  Consider PA and lateral radiograph follow-up when the patient can tolerate for better characterization.   Original Report Authenticated By: Jearld Lesch, M.D.      1. Epigastric abdominal pain   2. Nausea and vomiting in adult     Room air  saturation is 100% and I interpret this to be normal.  ECG at time 00:41, sinus rhythm at a rate of 86, normal axis, normal intervals, no ST or T-wave abnormalities.   3:23 AM Pt reports she did vomit soon after I saw her, and that led to her feelign much improved.  She has no abd pain, no CP currently.  Troponin was neg, U/S showed no acute biliary.  CXR showed no infiltrates, no free air.  Pt's BP is normal, no guard or rebound on exam.  Pt would like to go home. MDM   Patient ate heavily today, developed acute upper epigastric discomfort that radiates into her chest. She has a very tender upper epigastrium, right upper quadrant. I suspect biliary colic as etiology. She has no prior history of coronary disease, normal EKG, and significantly reproducible tenderness in her upper epigastrium making acute coronary syndrome very unlikely. Plan is to treat symptoms, obtain ultrasound and continue to monitor.        Gavin Pound. Oletta Lamas, MD 01/01/13 [phone]

## 2013-01-01 NOTE — ED Notes (Signed)
Ultrasound at bedside

## 2013-01-06 ENCOUNTER — Encounter: Payer: Self-pay | Admitting: Internal Medicine

## 2013-01-08 ENCOUNTER — Encounter: Payer: Self-pay | Admitting: Internal Medicine

## 2013-01-08 ENCOUNTER — Telehealth: Payer: Self-pay | Admitting: Internal Medicine

## 2013-01-08 ENCOUNTER — Ambulatory Visit (INDEPENDENT_AMBULATORY_CARE_PROVIDER_SITE_OTHER): Payer: Medicare Other | Admitting: Internal Medicine

## 2013-01-08 VITALS — BP 150/82 | HR 87 | Temp 98.4°F | Resp 18 | Wt 177.0 lb

## 2013-01-08 DIAGNOSIS — E039 Hypothyroidism, unspecified: Secondary | ICD-10-CM | POA: Diagnosis not present

## 2013-01-08 DIAGNOSIS — R1013 Epigastric pain: Secondary | ICD-10-CM

## 2013-01-08 DIAGNOSIS — I10 Essential (primary) hypertension: Secondary | ICD-10-CM

## 2013-01-08 DIAGNOSIS — Z1211 Encounter for screening for malignant neoplasm of colon: Secondary | ICD-10-CM

## 2013-01-08 NOTE — Telephone Encounter (Signed)
Pt notified referral for GI done for Colonoscopy and someone will contact her.

## 2013-01-08 NOTE — Telephone Encounter (Signed)
ok 

## 2013-01-08 NOTE — Telephone Encounter (Signed)
Patient called stating that she would like a referral for a colonoscopy. Please assist.

## 2013-01-08 NOTE — Progress Notes (Signed)
Subjective:    Patient ID: Teresa Rangel, female    DOB: [birth year], 72 y.o.   MRN: [id number]  HPI  72 year old patient who is seen today following an ED visit one week ago. She presented with abdominal fullness and pain. An abdominal ultrasound was unremarkable. Over the past week she has done much better with only mild epigastric discomfort. She has been on a light diet. No significant pain nausea or vomiting. She has been using Zantac twice daily. She states that she does have a remote history of peptic ulcer disease years ago. No recurrent nausea or vomiting she has treated hypertension and hypothyroidism post thyroidectomy  ED records and x-rays reviewed  History reviewed. No pertinent past medical history.  History   Social History  . Marital Status: Married    Spouse Name: N/A    Number of Children: N/A  . Years of Education: N/A   Occupational History  . Not on file.   Social History Main Topics  . Smoking status: Never Smoker   . Smokeless tobacco: Never Used  . Alcohol Use: 0.6 oz/week    1 Glasses of wine per week  . Drug Use: No  . Sexually Active: Yes -- Female partner(s)   Other Topics Concern  . Not on file   Social History Narrative  . No narrative on file    History reviewed. No pertinent past surgical history.  No family history on file.  No Known Allergies  Current Outpatient Prescriptions on File Prior to Visit  Medication Sig Dispense Refill  . amLODipine (NORVASC) 5 MG tablet Take 1 tablet (5 mg total) by mouth daily.  90 tablet  3  . cholecalciferol (VITAMIN D) 1000 UNITS tablet Take 2,000 Units by mouth daily.      . hydrochlorothiazide (HYDRODIURIL) 25 MG tablet Take 1 tablet (25 mg total) by mouth daily.  90 tablet  6  . levothyroxine (SYNTHROID, LEVOTHROID) 125 MCG tablet Take 1 tablet (125 mcg total) by mouth daily.  90 tablet  6  . pravastatin (PRAVACHOL) 20 MG tablet Take 1 tablet (20 mg total) by mouth daily.  90 tablet  6  .  promethazine (PHENERGAN) 25 MG tablet Take 1 tablet (25 mg total) by mouth every 6 (six) hours as needed for nausea.  20 tablet  0  . quinapril (ACCUPRIL) 40 MG tablet Take 1 tablet (40 mg total) by mouth at bedtime.  90 tablet  6  . vitamin C (ASCORBIC ACID) 500 MG tablet Take 500 mg by mouth daily.      Marland Kitchen zolpidem (AMBIEN) 5 MG tablet Take 1 tablet (5 mg total) by mouth at bedtime as needed for sleep.  15 tablet  3   No current facility-administered medications on file prior to visit.    BP 150/82  Pulse 87  Temp(Src) 98.4 F (36.9 C) (Oral)  Resp 18  Wt 177 lb (80.287 kg)  BMI 30.37 kg/m2  SpO2 97%       Review of Systems  Constitutional: Negative.   HENT: Negative for hearing loss, congestion, sore throat, rhinorrhea, dental problem, sinus pressure and tinnitus.   Eyes: Negative for pain, discharge and visual disturbance.  Respiratory: Negative for cough and shortness of breath.   Cardiovascular: Negative for chest pain, palpitations and leg swelling.  Gastrointestinal: Positive for abdominal pain. Negative for nausea, vomiting, diarrhea, constipation, blood in stool and abdominal distention.  Genitourinary: Negative for dysuria, urgency, frequency, hematuria, flank pain, vaginal bleeding, vaginal discharge, difficulty urinating,  vaginal pain and pelvic pain.  Musculoskeletal: Negative for joint swelling, arthralgias and gait problem.       Pain and swelling involving the small joints of the hands  Skin: Negative for rash.  Neurological: Negative for dizziness, syncope, speech difficulty, weakness, numbness and headaches.  Hematological: Negative for adenopathy.  Psychiatric/Behavioral: Negative for behavioral problems, dysphoric mood and agitation. The patient is not nervous/anxious.        Objective:   Physical Exam  Constitutional: She is oriented to person, place, and time. She appears well-developed and well-nourished.  HENT:  Head: Normocephalic.  Right Ear:  External ear normal.  Left Ear: External ear normal.  Mouth/Throat: Oropharynx is clear and moist.  Eyes: Conjunctivae and EOM are normal. Pupils are equal, round, and reactive to light.  Neck: Normal range of motion. Neck supple. No thyromegaly present.  Cardiovascular: Normal rate, regular rhythm, normal heart sounds and intact distal pulses.   Pulmonary/Chest: Effort normal and breath sounds normal.  Abdominal: Soft. Bowel sounds are normal. She exhibits no mass. There is no tenderness.  Very mild epigastric tenderness  Musculoskeletal: Normal range of motion.  Lymphadenopathy:    She has no cervical adenopathy.  Neurological: She is alert and oriented to person, place, and time.  Skin: Skin is warm and dry. No rash noted.  Psychiatric: She has a normal mood and affect. Her behavior is normal.          Assessment & Plan:   Epigastric pain. Improved on H2 blocker therapy and light diet. Will continue The patient has been scheduled for a followup colonoscopy. At the time of her examination last year she stated that her last colonoscopy was 7 years ago. She will review her records and will cancel colonoscopy if less than 10 years

## 2013-01-08 NOTE — Patient Instructions (Signed)
Avoids foods high in acid such as tomatoes citrus juices, and spicy foods.  Avoid eating within two hours of lying down or before exercising.  Do not overheat.  Try smaller more frequent meals.  If symptoms persist, elevate the head of her bed 12 inches while sleeping.  Return in 6 months for follow-up  Call or return to clinic prn if these symptoms worsen or fail to improve as anticipated.

## 2013-01-16 ENCOUNTER — Encounter: Payer: Self-pay | Admitting: Internal Medicine

## 2013-02-11 DIAGNOSIS — L218 Other seborrheic dermatitis: Secondary | ICD-10-CM | POA: Diagnosis not present

## 2013-02-11 DIAGNOSIS — L679 Hair color and hair shaft abnormality, unspecified: Secondary | ICD-10-CM | POA: Diagnosis not present

## 2013-02-14 ENCOUNTER — Ambulatory Visit (AMBULATORY_SURGERY_CENTER): Payer: Medicare Other | Admitting: *Deleted

## 2013-02-14 VITALS — Ht 65.0 in | Wt 176.6 lb

## 2013-02-14 DIAGNOSIS — Z1211 Encounter for screening for malignant neoplasm of colon: Secondary | ICD-10-CM

## 2013-02-14 MED ORDER — NA SULFATE-K SULFATE-MG SULF 17.5-3.13-1.6 GM/177ML PO SOLN: ORAL | Status: DC

## 2013-02-27 ENCOUNTER — Encounter: Payer: Self-pay | Admitting: Internal Medicine

## 2013-02-27 ENCOUNTER — Ambulatory Visit (AMBULATORY_SURGERY_CENTER): Payer: Medicare Other | Admitting: Internal Medicine

## 2013-02-27 VITALS — BP 146/77 | HR 68 | Temp 98.3°F | Resp 11 | Ht 65.0 in | Wt 176.0 lb

## 2013-02-27 DIAGNOSIS — Z1211 Encounter for screening for malignant neoplasm of colon: Secondary | ICD-10-CM

## 2013-02-27 DIAGNOSIS — I10 Essential (primary) hypertension: Secondary | ICD-10-CM | POA: Diagnosis not present

## 2013-02-27 DIAGNOSIS — E039 Hypothyroidism, unspecified: Secondary | ICD-10-CM | POA: Diagnosis not present

## 2013-02-27 MED ORDER — SODIUM CHLORIDE 0.9 % IV SOLN: 500.0000 mL | INTRAVENOUS | Status: DC

## 2013-02-27 NOTE — Op Note (Signed)
[facility] [address] [place], [address]   COLONOSCOPY PROCEDURE REPORT  PATIENT: Teresa Rangel, Teresa Rangel  MR#: [id number] BIRTHDATE: March 18, 1941 , 72  yrs. old GENDER: Female ENDOSCOPIST: Iva Boop, MD, [facility] REFERRED NW:GNFAO Amador Cunas, M.D. PROCEDURE DATE:  02/27/2013 PROCEDURE:   Colonoscopy, screening ASA CLASS:   Class II INDICATIONS:average risk screening and Last colonoscopy performed 11 years ago. MEDICATIONS: propofol (Diprivan) 150mg  IV, MAC sedation, administered by CRNA, and These medications were titrated to patient response per physician's verbal order  DESCRIPTION OF PROCEDURE:   After the risks benefits and alternatives of the procedure were thoroughly explained, informed consent was obtained.  A digital rectal exam revealed no abnormalities of the rectum.   The LB [id number] and LB [id number]  endoscope was introduced through the anus and advanced to the cecum, which was identified by both the appendix and ileocecal valve. No adverse events experienced.   The quality of the prep was excellent using Suprep  The instrument was then slowly withdrawn as the colon was fully examined.      COLON FINDINGS: A normal appearing cecum, ileocecal valve, and appendiceal orifice were identified.  The ascending, hepatic flexure, transverse, splenic flexure, descending, sigmoid colon and rectum appeared unremarkable.  No polyps or cancers were seen.   A right colon retroflexion was performed.  Retroflexed views revealed no abnormalities. The time to cecum=2 minutes 00 seconds. Withdrawal time=7 minutes 02 seconds.  The scope was withdrawn and the procedure completed. COMPLICATIONS: There were no complications.  ENDOSCOPIC IMPRESSION: Normal colonoscopy  RECOMMENDATIONS: Follow-up is advised on a PRN basis.   eSigned:  Iva Boop, MD, [facility] 02/27/2013 3:07 PM   cc: Gordy Savers, MD and The Patient

## 2013-02-27 NOTE — Progress Notes (Signed)
Lidocaine-40mg IV prior to Propofol InductionPropofol given over incremental dosages 

## 2013-02-27 NOTE — Patient Instructions (Addendum)
The colonoscopy was normal.  You likely do not need further routine colonoscopy - you can discuss with your primary care provider when you turn 82.  I appreciate the opportunity to care for you. Iva Boop, MD, FACG  YOU HAD AN ENDOSCOPIC PROCEDURE TODAY AT THE [facility]: Refer to the procedure report that was given to you for any specific questions about what was found during the examination.  If the procedure report does not answer your questions, please call your gastroenterologist to clarify.  If you requested that your care partner not be given the details of your procedure findings, then the procedure report has been included in a sealed envelope for you to review at your convenience later.  YOU SHOULD EXPECT: Some feelings of bloating in the abdomen. Passage of more gas than usual.  Walking can help get rid of the air that was put into your GI tract during the procedure and reduce the bloating. If you had a lower endoscopy (such as a colonoscopy or flexible sigmoidoscopy) you may notice spotting of blood in your stool or on the toilet paper. If you underwent a bowel prep for your procedure, then you may not have a normal bowel movement for a few days.  DIET: Your first meal following the procedure should be a light meal and then it is ok to progress to your normal diet.  A half-sandwich or bowl of soup is an example of a good first meal.  Heavy or fried foods are harder to digest and may make you feel nauseous or bloated.  Likewise meals heavy in dairy and vegetables can cause extra gas to form and this can also increase the bloating.  Drink plenty of fluids but you should avoid alcoholic beverages for 24 hours.  ACTIVITY: Your care partner should take you home directly after the procedure.  You should plan to take it easy, moving slowly for the rest of the day.  You can resume normal activity the day after the procedure however you should NOT DRIVE or use heavy machinery for  24 hours (because of the sedation medicines used during the test).    SYMPTOMS TO REPORT IMMEDIATELY: A gastroenterologist can be reached at any hour.  During normal business hours, 8:30 AM to 5:00 PM Monday through Friday, call [phone].  After hours and on weekends, please call the GI answering service at [phone] who will take a message and have the physician on call contact you.   Following lower endoscopy (colonoscopy or flexible sigmoidoscopy):  Excessive amounts of blood in the stool  Significant tenderness or worsening of abdominal pains  Swelling of the abdomen that is new, acute  Fever of 100F or higher  FOLLOW UP: If any biopsies were taken you will be contacted by phone or by letter within the next 1-3 weeks.  Call your gastroenterologist if you have not heard about the biopsies in 3 weeks.  Our staff will call the home number listed on your records the next business day following your procedure to check on you and address any questions or concerns that you may have at that time regarding the information given to you following your procedure. This is a courtesy call and so if there is no answer at the home number and we have not heard from you through the emergency physician on call, we will assume that you have returned to your regular daily activities without incident.  SIGNATURES/CONFIDENTIALITY: You and/or your care partner have signed  paperwork which will be entered into your electronic medical record.  These signatures attest to the fact that that the information above on your After Visit Summary has been reviewed and is understood.  Full responsibility of the confidentiality of this discharge information lies with you and/or your care-partner.  Follow-up on as needed basis

## 2013-02-27 NOTE — Progress Notes (Signed)
Patient did not experience any of the following events: a burn prior to discharge; a fall within the facility; wrong site/side/patient/procedure/implant event; or a hospital transfer or hospital admission upon discharge from the facility. (G8907) Patient did not have preoperative order for IV antibiotic SSI prophylaxis. (G8918)  

## 2013-02-28 ENCOUNTER — Telehealth: Payer: Self-pay | Admitting: *Deleted

## 2013-02-28 NOTE — Telephone Encounter (Signed)
  Follow up Call-  Call back number 02/27/2013  Post procedure Call Back phone  # [phone]  Permission to leave phone message Yes     Patient questions:  Do you have a fever, pain , or abdominal swelling? no Pain Score  0 *  Have you tolerated food without any problems? yes  Have you been able to return to your normal activities? yes  Do you have any questions about your discharge instructions: Diet   no Medications  no Follow up visit  no  Do you have questions or concerns about your Care? no  Actions: * If pain score is 4 or above: No action needed, pain <4.

## 2013-03-07 ENCOUNTER — Encounter: Payer: Medicare Other | Admitting: Internal Medicine

## 2013-04-18 ENCOUNTER — Other Ambulatory Visit: Payer: Self-pay

## 2013-04-25 DIAGNOSIS — L299 Pruritus, unspecified: Secondary | ICD-10-CM | POA: Diagnosis not present

## 2013-04-25 DIAGNOSIS — D2370 Other benign neoplasm of skin of unspecified lower limb, including hip: Secondary | ICD-10-CM | POA: Diagnosis not present

## 2013-04-25 DIAGNOSIS — L219 Seborrheic dermatitis, unspecified: Secondary | ICD-10-CM | POA: Diagnosis not present

## 2013-05-23 DIAGNOSIS — H3560 Retinal hemorrhage, unspecified eye: Secondary | ICD-10-CM | POA: Diagnosis not present

## 2013-05-24 ENCOUNTER — Other Ambulatory Visit: Payer: Self-pay | Admitting: Internal Medicine

## 2013-06-07 ENCOUNTER — Ambulatory Visit (INDEPENDENT_AMBULATORY_CARE_PROVIDER_SITE_OTHER): Payer: Medicare Other | Admitting: Internal Medicine

## 2013-06-07 ENCOUNTER — Encounter: Payer: Self-pay | Admitting: Internal Medicine

## 2013-06-07 VITALS — BP 130/80 | HR 70 | Temp 98.2°F | Resp 20 | Wt 172.0 lb

## 2013-06-07 DIAGNOSIS — E039 Hypothyroidism, unspecified: Secondary | ICD-10-CM

## 2013-06-07 DIAGNOSIS — Z23 Encounter for immunization: Secondary | ICD-10-CM | POA: Diagnosis not present

## 2013-06-07 DIAGNOSIS — E785 Hyperlipidemia, unspecified: Secondary | ICD-10-CM | POA: Insufficient documentation

## 2013-06-07 DIAGNOSIS — I10 Essential (primary) hypertension: Secondary | ICD-10-CM | POA: Diagnosis not present

## 2013-06-07 DIAGNOSIS — E559 Vitamin D deficiency, unspecified: Secondary | ICD-10-CM | POA: Diagnosis not present

## 2013-06-07 LAB — TSH: TSH: 0.68 u[IU]/mL (ref 0.35–5.50)

## 2013-06-07 NOTE — Progress Notes (Signed)
Subjective:    Patient ID: Teresa Rangel, female    DOB: 07-30-1941, 72 y.o.   MRN: [id number]  HPI 72 year old patient who is in today for her biannual followup. Medical problems include hypertension dyslipidemia and hypothyroidism. 2 status post partial thyroidectomy. No real concerns or complaints today. She also has a history mild insomnia and vitamin D deficiency. She is currently participating in a [employer] program.  Past Medical History  Diagnosis Date  . Hypertension   . Hyperlipidemia   . Hypothyroidism   . History of stomach ulcers 1980    History   Social History  . Marital Status: Married    Spouse Name: N/A    Number of Children: N/A  . Years of Education: N/A   Occupational History  . Not on file.   Social History Main Topics  . Smoking status: Never Smoker   . Smokeless tobacco: Never Used  . Alcohol Use: 0.6 oz/week    1 Glasses of wine per week  . Drug Use: No  . Sexual Activity: Yes    Partners: Male   Other Topics Concern  . Not on file   Social History Narrative  . No narrative on file    Past Surgical History  Procedure Laterality Date  . Thyroidectomy  2007  . Appendectomy  1954  . Tonsillectomy  1949  . Laparoscopy  1982  . Colonoscopy      No family history on file.  Allergies  Allergen Reactions  . Tetracyclines & Related     Stomach cramps    Current Outpatient Prescriptions on File Prior to Visit  Medication Sig Dispense Refill  . amLODipine (NORVASC) 5 MG tablet Take 1 tablet (5 mg total) by mouth daily.  90 tablet  3  . cholecalciferol (VITAMIN D) 1000 UNITS tablet Take 2,000 Units by mouth daily.      . hydrochlorothiazide (HYDRODIURIL) 25 MG tablet Take 1 tablet (25 mg total) by mouth daily.  90 tablet  6  . levothyroxine (SYNTHROID, LEVOTHROID) 125 MCG tablet Take 1 tablet (125 mcg total) by mouth daily.  90 tablet  6  . pravastatin (PRAVACHOL) 20 MG tablet Take 1 tablet (20 mg total) by mouth daily.  90 tablet   6  . quinapril (ACCUPRIL) 40 MG tablet Take 1 tablet (40 mg total) by mouth at bedtime.  90 tablet  6  . vitamin C (ASCORBIC ACID) 500 MG tablet Take 500 mg by mouth daily.      Marland Kitchen zolpidem (AMBIEN) 5 MG tablet Take 1 tablet (5 mg total) by mouth at bedtime as needed for sleep.  15 tablet  3   No current facility-administered medications on file prior to visit.    BP 130/80  Pulse 70  Temp(Src) 98.2 F (36.8 C) (Oral)  Resp 20  Wt 172 lb (78.019 kg)  BMI 28.62 kg/m2  SpO2 98%      Review of Systems  Constitutional: Negative.   HENT: Negative for hearing loss, congestion, sore throat, rhinorrhea, dental problem, sinus pressure and tinnitus.   Eyes: Negative for pain, discharge and visual disturbance.  Respiratory: Negative for cough and shortness of breath.   Cardiovascular: Negative for chest pain, palpitations and leg swelling.  Gastrointestinal: Negative for nausea, vomiting, abdominal pain, diarrhea, constipation, blood in stool and abdominal distention.  Genitourinary: Negative for dysuria, urgency, frequency, hematuria, flank pain, vaginal bleeding, vaginal discharge, difficulty urinating, vaginal pain and pelvic pain.  Musculoskeletal: Negative for joint swelling, arthralgias and gait  problem.  Skin: Negative for rash.  Neurological: Negative for dizziness, syncope, speech difficulty, weakness, numbness and headaches.  Hematological: Negative for adenopathy.  Psychiatric/Behavioral: Negative for behavioral problems, dysphoric mood and agitation. The patient is not nervous/anxious.        Objective:   Physical Exam  Constitutional: She is oriented to person, place, and time. She appears well-developed and well-nourished.  HENT:  Head: Normocephalic.  Right Ear: External ear normal.  Left Ear: External ear normal.  Mouth/Throat: Oropharynx is clear and moist.  Eyes: Conjunctivae and EOM are normal. Pupils are equal, round, and reactive to light.  Neck: Normal range of  motion. Neck supple. No thyromegaly present.  Cardiovascular: Normal rate, regular rhythm, normal heart sounds and intact distal pulses.   Pulmonary/Chest: Effort normal and breath sounds normal.  Abdominal: Soft. Bowel sounds are normal. She exhibits no mass. There is no tenderness.  Musculoskeletal: Normal range of motion.  Lymphadenopathy:    She has no cervical adenopathy.  Neurological: She is alert and oriented to person, place, and time.  Skin: Skin is warm and dry. No rash noted.  Psychiatric: She has a normal mood and affect. Her behavior is normal.          Assessment & Plan:   Hypertension well controlled Hypothyroidism. We'll check a TSH Dyslipidemia  Exercise weight loss encouraged Low salt  Diet recommended CPX 6 months an diet

## 2013-06-07 NOTE — Patient Instructions (Signed)
Limit your sodium (Salt) intake    It is important that you exercise regularly, at least 20 minutes 3 to 4 times per week.  If you develop chest pain or shortness of breath seek  medical attention.  Return in 6 months for follow-up  

## 2013-08-02 DIAGNOSIS — H26499 Other secondary cataract, unspecified eye: Secondary | ICD-10-CM | POA: Diagnosis not present

## 2013-08-11 DIAGNOSIS — N390 Urinary tract infection, site not specified: Secondary | ICD-10-CM | POA: Diagnosis not present

## 2013-08-15 DIAGNOSIS — H26499 Other secondary cataract, unspecified eye: Secondary | ICD-10-CM | POA: Diagnosis not present

## 2013-09-03 ENCOUNTER — Other Ambulatory Visit: Payer: Self-pay | Admitting: Internal Medicine

## 2013-09-07 ENCOUNTER — Other Ambulatory Visit: Payer: Self-pay

## 2013-09-07 DIAGNOSIS — Z1231 Encounter for screening mammogram for malignant neoplasm of breast: Secondary | ICD-10-CM

## 2013-10-15 ENCOUNTER — Ambulatory Visit
Admission: RE | Admit: 2013-10-15 | Discharge: 2013-10-15 | Disposition: A | Discharge status: Home / Self Care | Payer: Medicare Other | Source: Ambulatory Visit

## 2013-10-15 DIAGNOSIS — Z1231 Encounter for screening mammogram for malignant neoplasm of breast: Secondary | ICD-10-CM

## 2013-11-15 ENCOUNTER — Encounter: Payer: Self-pay | Admitting: Podiatry

## 2013-11-15 ENCOUNTER — Ambulatory Visit (INDEPENDENT_AMBULATORY_CARE_PROVIDER_SITE_OTHER): Payer: Medicare Other | Admitting: Podiatry

## 2013-11-15 ENCOUNTER — Ambulatory Visit (INDEPENDENT_AMBULATORY_CARE_PROVIDER_SITE_OTHER): Payer: Medicare Other

## 2013-11-15 VITALS — BP 147/70 | HR 68 | Resp 12

## 2013-11-15 DIAGNOSIS — M2010 Hallux valgus (acquired), unspecified foot: Secondary | ICD-10-CM

## 2013-11-15 DIAGNOSIS — R52 Pain, unspecified: Secondary | ICD-10-CM

## 2013-11-15 DIAGNOSIS — M216X9 Other acquired deformities of unspecified foot: Secondary | ICD-10-CM

## 2013-11-15 NOTE — Progress Notes (Signed)
   Subjective:    Patient ID: Teresa Rangel, female    DOB: 03-27-1941, 73 y.o.   MRN: [id number]  HPI PT STATED RT FOOT HAVE BUNION AND ITS BEEN HURTING FOR 1 YEAR. THE SYMPTOMS ITS GETTING WORSE. THE FOOT GET AGGRAVATED WITH CLOSED SHOES. TRIED TO MASSAGE, BUT NO MEDICATION TREATMENT.''    Review of Systems  All other systems reviewed and are negative.       Objective:   Physical Exam: I have evaluated her past medical history medications allergies surgeries social history family history and review of systems. Vital signs are stable she is alert and oriented x3. Pulses are strongly palpable bilateral. Capillary fill time to digits one through 5 of the bilateral foot is immediate. Neurologic sensorium is intact per since once the monofilament. Deep tendon reflexes are intact bilateral. Muscle strength is 5 over 5 dorsiflexors plantar flexors inverters everters all intrinsic musculature is intact. Orthopedic evaluation demonstrates normal rectus foot type with exception of increase in the first intermetatarsal angle resulting in hallux abductovalgus deformity. She has tenderness on range of motion of the first metatarsophalangeal joint and on palpation the medial aspect of the head of the first metatarsal. She has an elongated second metatarsal that is resulting in mild hammertoe deformity and tenderness beneath the second metatarsophalangeal joint. Radiographic confirms after mentioned increase in the first intermetatarsal angle. Dislocation of the first metatarsophalangeal joint. Early osteoarthritic signs and plantar flexed elongated second metatarsal. Cutaneous evaluation demonstrates supple well hydrated cutis no erythema edema cellulitis drainage or odor.        Assessment & Plan:  Assessment: Hallux abductovalgus deformity bilateral right greater than left. Plantar flexed elongated second metatarsal right greater than left. Capsulitis of the second metatarsophalangeal joint  right.  Plan: We discussed the etiology pathology conservative versus surgical therapies. I discussed in great detail today surgical intervention regarding this foot. She understands that and is amenable to it I will followup with her in the near future for a surgical consult with her and her husband.

## 2013-11-16 DIAGNOSIS — H26499 Other secondary cataract, unspecified eye: Secondary | ICD-10-CM | POA: Diagnosis not present

## 2013-12-08 ENCOUNTER — Other Ambulatory Visit: Payer: Self-pay | Admitting: Internal Medicine

## 2013-12-19 DIAGNOSIS — L821 Other seborrheic keratosis: Secondary | ICD-10-CM | POA: Diagnosis not present

## 2013-12-19 DIAGNOSIS — D1801 Hemangioma of skin and subcutaneous tissue: Secondary | ICD-10-CM | POA: Diagnosis not present

## 2013-12-19 DIAGNOSIS — L820 Inflamed seborrheic keratosis: Secondary | ICD-10-CM | POA: Diagnosis not present

## 2013-12-19 DIAGNOSIS — I789 Disease of capillaries, unspecified: Secondary | ICD-10-CM | POA: Diagnosis not present

## 2013-12-20 DIAGNOSIS — M19049 Primary osteoarthritis, unspecified hand: Secondary | ICD-10-CM | POA: Diagnosis not present

## 2013-12-21 ENCOUNTER — Encounter: Payer: Medicare Other | Admitting: Internal Medicine

## 2013-12-29 ENCOUNTER — Other Ambulatory Visit: Payer: Self-pay | Admitting: Internal Medicine

## 2014-01-02 ENCOUNTER — Other Ambulatory Visit: Payer: Self-pay | Admitting: Internal Medicine

## 2014-01-14 ENCOUNTER — Telehealth: Payer: Self-pay | Admitting: Internal Medicine

## 2014-01-14 MED ORDER — QUINAPRIL HCL 40 MG PO TABS: 40.0000 mg | ORAL_TABLET | Freq: Every day | ORAL | Status: DC

## 2014-01-14 NOTE — Telephone Encounter (Signed)
Rx sent 

## 2014-01-14 NOTE — Telephone Encounter (Signed)
[facility] - [place], [place] - [address] is requesting re-fill on quinapril (ACCUPRIL) 40 MG tablet ° °

## 2014-01-17 ENCOUNTER — Ambulatory Visit: Payer: Medicare Other | Admitting: Podiatry

## 2014-02-15 ENCOUNTER — Other Ambulatory Visit: Payer: Self-pay | Admitting: Internal Medicine

## 2014-02-26 ENCOUNTER — Encounter: Payer: Self-pay | Admitting: Internal Medicine

## 2014-02-26 ENCOUNTER — Ambulatory Visit (INDEPENDENT_AMBULATORY_CARE_PROVIDER_SITE_OTHER): Payer: Medicare Other | Admitting: Internal Medicine

## 2014-02-26 VITALS — BP 130/76 | HR 78 | Temp 98.7°F | Resp 18 | Ht 63.75 in | Wt 172.0 lb

## 2014-02-26 DIAGNOSIS — I10 Essential (primary) hypertension: Secondary | ICD-10-CM | POA: Diagnosis not present

## 2014-02-26 DIAGNOSIS — E559 Vitamin D deficiency, unspecified: Secondary | ICD-10-CM | POA: Diagnosis not present

## 2014-02-26 DIAGNOSIS — E785 Hyperlipidemia, unspecified: Secondary | ICD-10-CM

## 2014-02-26 DIAGNOSIS — Z0000 Encounter for general adult medical examination without abnormal findings: Secondary | ICD-10-CM | POA: Diagnosis not present

## 2014-02-26 DIAGNOSIS — E039 Hypothyroidism, unspecified: Secondary | ICD-10-CM

## 2014-02-26 LAB — COMPREHENSIVE METABOLIC PANEL
ALT: 21 U/L (ref 0–35)
AST: 21 U/L (ref 0–37)
Albumin: 4.2 g/dL (ref 3.5–5.2)
Alkaline Phosphatase: 66 U/L (ref 39–117)
BILIRUBIN TOTAL: 0.9 mg/dL (ref 0.2–1.2)
BUN: 16 mg/dL (ref 6–23)
CO2: 31 mEq/L (ref 19–32)
CREATININE: 0.9 mg/dL (ref 0.4–1.2)
Calcium: 9.5 mg/dL (ref 8.4–10.5)
Chloride: 101 mEq/L (ref 96–112)
GFR: 68.53 mL/min (ref >60.00)
GLUCOSE: 93 mg/dL (ref 70–99)
Potassium: 3.9 mEq/L (ref 3.5–5.1)
Sodium: 139 mEq/L (ref 135–145)
Total Protein: 7 g/dL (ref 6.0–8.3)

## 2014-02-26 LAB — LIPID PANEL
CHOLESTEROL: 193 mg/dL (ref 0–200)
HDL: 47.5 mg/dL (ref >39.00)
LDL Cholesterol: 111 mg/dL — ABNORMAL HIGH (ref 0–99)
NONHDL: 145.5
Total CHOL/HDL Ratio: 4
Triglycerides: 175 mg/dL — ABNORMAL HIGH (ref 0.0–149.0)
VLDL: 35 mg/dL (ref 0.0–40.0)

## 2014-02-26 LAB — CBC WITH DIFFERENTIAL/PLATELET
Basophils Absolute: 0 10*3/uL (ref 0.0–0.1)
Basophils Relative: 0.6 % (ref 0.0–3.0)
EOS PCT: 4.6 % (ref 0.0–5.0)
Eosinophils Absolute: 0.3 10*3/uL (ref 0.0–0.7)
HEMATOCRIT: 44.7 % (ref 36.0–46.0)
HEMOGLOBIN: 15.1 g/dL — AB (ref 12.0–15.0)
Lymphocytes Relative: 27.7 % (ref 12.0–46.0)
Lymphs Abs: 1.7 10*3/uL (ref 0.7–4.0)
MCHC: 33.9 g/dL (ref 30.0–36.0)
MCV: 85.7 fl (ref 78.0–100.0)
MONOS PCT: 8.2 % (ref 3.0–12.0)
Monocytes Absolute: 0.5 10*3/uL (ref 0.1–1.0)
NEUTROS ABS: 3.6 10*3/uL (ref 1.4–7.7)
Neutrophils Relative %: 58.9 % (ref 43.0–77.0)
Platelets: 257 10*3/uL (ref 150.0–400.0)
RBC: 5.22 Mil/uL — AB (ref 3.87–5.11)
RDW: 12.9 % (ref 11.5–15.5)
WBC: 6.1 10*3/uL (ref 4.0–10.5)

## 2014-02-26 LAB — TSH: TSH: 3.45 u[IU]/mL (ref 0.35–4.50)

## 2014-02-26 MED ORDER — ZOLPIDEM TARTRATE 5 MG PO TABS: ORAL_TABLET | ORAL | Status: DC

## 2014-02-26 NOTE — Progress Notes (Signed)
Subjective:    Patient ID: Teresa Rangel, female    DOB: 1941-02-09, 73 y.o.   MRN: [id number]  HPI 73year-old patient who is seen today for a preventive health examination. She has long history of hypertension that has been controlled on 2 drugs. She states that she has a remote history of fibromyalgia and restless leg syndrome.  Past medical history she has had thyroid surgery for benign tumor and a remote appendectomy in 1955;  she had a colonoscopy 9 years ago and followup in 2014. She has remote history depression.  Family history father died at 27 complications of COPD and coronary artery disease Mother died at 46 in good health and died in a motor vehicle accident. Paternal grandmother with diabetes;  one brother in good health one sister with cerebrovascular disease  Social history relocated from [place] about 3 years ago. 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 Nonsmoker  1. Risk factors, based on past  M,S,F history-  cardiovascular risk factors include a history of hypertension 2.  Physical activities: Remains quite active does supper to spread add a health club 3 times weekly  3.  Depression/mood: Remote history depression has been on Zoloft briefly in the past  4.  Hearing: No deficits  5.  ADL's: Independent in all aspects of daily living  6.  Fall risk: Low   7.  Home safety: No problems identified  8.  Height weight, and visual acuity; height and weight stable no change in visual acuity  9.  Counseling: Not appropriate at this time  10. Lab orders based on risk factors: Will review in the spring  11. Referral : Not appropriate at this time  12. Care plan: We'll add amlodipine to her blood pressure regimen continue home blood pressure monitoring 13. Cognitive assessment: Alert and oriented normal affect. No cognitive dysfunction    Review of Systems  Constitutional: Negative for fever, appetite change, fatigue and unexpected weight change.  HENT: Positive for  congestion and sinus pressure. Negative for dental problem, ear pain, hearing loss, mouth sores, nosebleeds, sore throat, tinnitus, trouble swallowing and voice change.   Eyes: Negative for photophobia, pain, redness and visual disturbance.  Respiratory: Negative for cough, chest tightness and shortness of breath.   Cardiovascular: Negative for chest pain, palpitations and leg swelling.  Gastrointestinal: Negative for nausea, vomiting, abdominal pain, diarrhea, constipation, blood in stool, abdominal distention and rectal pain.  Genitourinary: Negative for dysuria, urgency, frequency, hematuria, flank pain, vaginal bleeding, vaginal discharge, difficulty urinating, genital sores, vaginal pain, menstrual problem and pelvic pain.  Musculoskeletal: Negative for arthralgias, back pain and neck stiffness.  Skin: Negative for rash.  Neurological: Positive for light-headedness. Negative for dizziness, syncope, speech difficulty, weakness, numbness and headaches.  Hematological: Negative for adenopathy. Does not bruise/bleed easily.  Psychiatric/Behavioral: Positive for sleep disturbance. Negative for suicidal ideas, behavioral problems, self-injury, dysphoric mood and agitation. The patient is not nervous/anxious.        Objective:   Physical Exam  Constitutional: She is oriented to person, place, and time. She appears well-developed and well-nourished.  HENT:  Head: Normocephalic.  Right Ear: External ear normal.  Left Ear: External ear normal.  Mouth/Throat: Oropharynx is clear and moist.  Eyes: Conjunctivae and EOM are normal. Pupils are equal, round, and reactive to light.  Neck: Normal range of motion. Neck supple. No thyromegaly present.  Thyroidectomy scar  Cardiovascular: Normal rate, regular rhythm, normal heart sounds and intact distal pulses.   Pulmonary/Chest: Effort normal and breath sounds  normal.  Abdominal: Soft. Bowel sounds are normal. She exhibits no mass. There is no  tenderness.  Musculoskeletal: Normal range of motion.  Lymphadenopathy:    She has no cervical adenopathy.  Neurological: She is alert and oriented to person, place, and time.  Skin: Skin is warm and dry. No rash noted.  Psychiatric: She has a normal mood and affect. Her behavior is normal.          Assessment & Plan:  Preventive health exam Hypertension .   Hypothyroidism.  We'll continue levothyroxin Dyslipidemia.  Continue statin therapy   We'll check screening labs Weight loss encouraged.  Has resumed Weight [employer] program Medications updated Recheck one year or as needed

## 2014-02-26 NOTE — Progress Notes (Signed)
Pre-visit discussion using our clinic review tool. No additional management support is needed unless otherwise documented below in the visit note.  

## 2014-02-26 NOTE — Patient Instructions (Signed)
Limit your sodium (Salt) intake  Please check your blood pressure on a regular basis.  If it is consistently greater than 150/90, please make an office appointment.    It is important that you exercise regularly, at least 20 minutes 3 to 4 times per week.  If you develop chest pain or shortness of breath seek  medical attention.  You need to lose weight.  Consider a lower calorie diet and regular exercise.  Return in one year for follow-up  Take a calcium supplement, plus [phone] units of vitamin D

## 2014-02-27 ENCOUNTER — Telehealth: Payer: Self-pay | Admitting: Internal Medicine

## 2014-02-27 NOTE — Telephone Encounter (Signed)
Relevant patient education assigned to patient using Emmi. ° °

## 2014-03-07 ENCOUNTER — Ambulatory Visit: Payer: Medicare Other | Admitting: Podiatry

## 2014-03-28 DIAGNOSIS — L719 Rosacea, unspecified: Secondary | ICD-10-CM | POA: Diagnosis not present

## 2014-04-03 ENCOUNTER — Telehealth: Payer: Self-pay | Admitting: Internal Medicine

## 2014-04-03 MED ORDER — HYDROCHLOROTHIAZIDE 25 MG PO TABS: ORAL_TABLET | ORAL | Status: DC

## 2014-04-03 NOTE — Telephone Encounter (Signed)
Rx sent 

## 2014-04-03 NOTE — Telephone Encounter (Signed)
[place], [place] is requesting re-fill on hydrochlorothiazide (HYDRODIURIL) 25 MG tablet

## 2014-04-23 ENCOUNTER — Telehealth: Payer: Self-pay | Admitting: Internal Medicine

## 2014-04-23 MED ORDER — QUINAPRIL HCL 40 MG PO TABS: 40.0000 mg | ORAL_TABLET | Freq: Every day | ORAL | Status: DC

## 2014-04-23 NOTE — Telephone Encounter (Signed)
Rx sent 

## 2014-04-23 NOTE — Telephone Encounter (Signed)
[place], [place] is requesting re-fill on quinapril (ACCUPRIL) 40 MG tablet

## 2014-05-16 ENCOUNTER — Other Ambulatory Visit: Payer: Self-pay | Admitting: *Deleted

## 2014-05-16 MED ORDER — AMLODIPINE BESYLATE 5 MG PO TABS: 5.0000 mg | ORAL_TABLET | Freq: Every day | ORAL | Status: DC

## 2014-06-08 DIAGNOSIS — R350 Frequency of micturition: Secondary | ICD-10-CM | POA: Diagnosis not present

## 2014-08-06 ENCOUNTER — Ambulatory Visit (INDEPENDENT_AMBULATORY_CARE_PROVIDER_SITE_OTHER): Payer: Medicare Other | Admitting: Podiatry

## 2014-08-06 ENCOUNTER — Encounter: Payer: Self-pay | Admitting: Podiatry

## 2014-08-06 ENCOUNTER — Ambulatory Visit (INDEPENDENT_AMBULATORY_CARE_PROVIDER_SITE_OTHER): Payer: Medicare Other

## 2014-08-06 VITALS — BP 142/79 | HR 77 | Resp 16

## 2014-08-06 DIAGNOSIS — M779 Enthesopathy, unspecified: Secondary | ICD-10-CM

## 2014-08-06 DIAGNOSIS — D3613 Benign neoplasm of peripheral nerves and autonomic nervous system of lower limb, including hip: Secondary | ICD-10-CM | POA: Diagnosis not present

## 2014-08-06 NOTE — Progress Notes (Signed)
She presents today for pain to the plantar lateral arch right foot. This has been present for a few days and gives me a little electrical shock on occasion. States that it does not hurt all the time.  Objective: Vital signs are stable she is alert and oriented 3 pulses are strongly palpable. When wiped with alcohol the area appears to be mildly erythematous with what appears to be either blood clot or a cutaneous neuroma beneath it. It feels like a small BB but is exquisitely tender on palpation. Radiographic evaluation did not demonstrate any type of foreign body.  Assessment: Cutaneous neuroma or varicosity right foot.  Plan: Injected with dexamethasone and local anesthetic today. She will follow up with me to consider bunion surgery as well as possible excision of this lesion should it not resolve.

## 2014-09-16 ENCOUNTER — Other Ambulatory Visit: Payer: Self-pay | Admitting: Internal Medicine

## 2014-09-18 ENCOUNTER — Other Ambulatory Visit: Payer: Self-pay

## 2014-09-18 DIAGNOSIS — Z1231 Encounter for screening mammogram for malignant neoplasm of breast: Secondary | ICD-10-CM

## 2014-09-23 DIAGNOSIS — Z0279 Encounter for issue of other medical certificate: Secondary | ICD-10-CM

## 2014-09-25 ENCOUNTER — Encounter: Payer: Self-pay | Admitting: *Deleted

## 2014-09-28 ENCOUNTER — Other Ambulatory Visit: Payer: Self-pay | Admitting: Internal Medicine

## 2014-10-23 ENCOUNTER — Other Ambulatory Visit: Payer: Self-pay | Admitting: Internal Medicine

## 2014-11-06 ENCOUNTER — Other Ambulatory Visit: Payer: Self-pay | Admitting: Internal Medicine

## 2014-11-08 ENCOUNTER — Ambulatory Visit
Admission: RE | Admit: 2014-11-08 | Discharge: 2014-11-08 | Disposition: A | Discharge status: Home / Self Care | Payer: Medicare Other | Source: Ambulatory Visit

## 2014-11-08 DIAGNOSIS — Z1231 Encounter for screening mammogram for malignant neoplasm of breast: Secondary | ICD-10-CM | POA: Diagnosis not present

## 2014-11-11 ENCOUNTER — Other Ambulatory Visit: Payer: Self-pay | Admitting: Internal Medicine

## 2014-11-11 DIAGNOSIS — R928 Other abnormal and inconclusive findings on diagnostic imaging of breast: Secondary | ICD-10-CM

## 2014-11-18 ENCOUNTER — Other Ambulatory Visit: Payer: Self-pay | Admitting: Internal Medicine

## 2014-11-18 ENCOUNTER — Ambulatory Visit
Admission: RE | Admit: 2014-11-18 | Discharge: 2014-11-18 | Disposition: A | Discharge status: Home / Self Care | Payer: Medicare Other | Source: Ambulatory Visit | Attending: Internal Medicine | Admitting: Internal Medicine

## 2014-11-18 DIAGNOSIS — N6310 Unspecified lump in the right breast, unspecified quadrant: Secondary | ICD-10-CM

## 2014-11-18 DIAGNOSIS — R928 Other abnormal and inconclusive findings on diagnostic imaging of breast: Secondary | ICD-10-CM

## 2014-11-18 DIAGNOSIS — N63 Unspecified lump in breast: Secondary | ICD-10-CM | POA: Diagnosis not present

## 2014-11-19 ENCOUNTER — Other Ambulatory Visit: Payer: Self-pay | Admitting: Internal Medicine

## 2014-11-19 DIAGNOSIS — N6310 Unspecified lump in the right breast, unspecified quadrant: Secondary | ICD-10-CM

## 2014-11-20 ENCOUNTER — Other Ambulatory Visit: Payer: Medicare Other

## 2014-11-21 ENCOUNTER — Ambulatory Visit
Admission: RE | Admit: 2014-11-21 | Discharge: 2014-11-21 | Disposition: A | Discharge status: Home / Self Care | Payer: Medicare Other | Source: Ambulatory Visit | Attending: Internal Medicine | Admitting: Internal Medicine

## 2014-11-21 DIAGNOSIS — N6310 Unspecified lump in the right breast, unspecified quadrant: Secondary | ICD-10-CM

## 2014-11-21 DIAGNOSIS — N641 Fat necrosis of breast: Secondary | ICD-10-CM | POA: Diagnosis not present

## 2014-11-21 DIAGNOSIS — N63 Unspecified lump in breast: Secondary | ICD-10-CM | POA: Diagnosis not present

## 2015-01-03 ENCOUNTER — Other Ambulatory Visit: Payer: Self-pay | Admitting: Internal Medicine

## 2015-01-21 ENCOUNTER — Telehealth: Payer: Self-pay | Admitting: *Deleted

## 2015-01-21 ENCOUNTER — Encounter: Payer: Self-pay | Admitting: Podiatry

## 2015-01-21 ENCOUNTER — Ambulatory Visit (INDEPENDENT_AMBULATORY_CARE_PROVIDER_SITE_OTHER): Payer: Medicare Other | Admitting: Podiatry

## 2015-01-21 VITALS — BP 165/87 | HR 79 | Resp 16

## 2015-01-21 DIAGNOSIS — D3613 Benign neoplasm of peripheral nerves and autonomic nervous system of lower limb, including hip: Secondary | ICD-10-CM | POA: Diagnosis not present

## 2015-01-21 DIAGNOSIS — M19071 Primary osteoarthritis, right ankle and foot: Secondary | ICD-10-CM | POA: Diagnosis not present

## 2015-01-21 DIAGNOSIS — M2011 Hallux valgus (acquired), right foot: Secondary | ICD-10-CM | POA: Diagnosis not present

## 2015-01-21 NOTE — Progress Notes (Signed)
She presents today for follow-up of pain to her right foot. She states that it hurts all through here as she points to the first metatarsal as well as the second metatarsal medial cuneiform and intermediate cuneiform joint. She states that the pain feels like it is all a 3 month foot and she pinches her foot from top to bottom. She denies any trauma to the foot and she denies any changes in her past medical history medications allergies or social history.  Objective: Vital signs are stable she is alert and oriented 3. Pulses are strongly palpable right foot she has pain on palpation and range of motion of the first metatarsophalangeal joint the second metatarsophalangeal joint which appears to be elevated particularly along the diaphysis of the second metatarsal. She also has pain along the second metatarsal intermediate cuneiform joint. Review of the radiographs do demonstrate an increase in the first intermetatarsal angle with osteoarthritic changes of the first metatarsophalangeal joint hallux abductus angle is greater than normal value with joint space narrowing indicative of osteoarthritis. The second metatarsal appears to be elevated distally possibly an old fracture or trauma in this area has also resulted in a cartilaginous breakdown of the second metatarsal intermediate cuneiform joint. Joint space narrowing is observed and is painful on range of motion.  Assessment: Hallux abductovalgus deformity and elongated elevated second metatarsal with capsulitis of the second metatarsophalangeal joint. Osteoarthritis of not only the first metatarsophalangeal joint at the base of the second metatarsal at the second metatarsal intermediate cuneiform joint.  Plan: Discussed etiology pathology conservative versus surgical therapy. At this point we discussed surgical intervention consisting of an Austin osteotomy with screw fixation and shortening second metatarsal osteotomy distally as well as a second metatarsal  intercuneiform fusion. I answered all questions regarding these procedures to the best of my ability in layman's terms we did discuss possible postop complications which may include but are not limited to postop pain bleeding swelling infection recurrence need for further surgery overcorrection under correction. Loss of digit loss of limb loss of life is a possibility with surgery. We will also apply a cast to the right lower extremity after surgery which will require nonweightbearing utilizing crutches or knee scooter. During this time we will also request that she take an aspirin to help prevent DVTs and PEs. I will follow-up with her in the near future for surgical intervention.

## 2015-01-21 NOTE — Patient Instructions (Signed)
Pre-Operative Instructions  Congratulations, you have decided to take an important step to improving your quality of life.  You can be assured that the doctors of [facility] will be with you every step of the way.  1. Plan to be at the surgery center/hospital at least 1 (one) hour prior to your scheduled time unless otherwise directed by the surgical center/hospital staff.  You must have a responsible adult accompany you, remain during the surgery and drive you home.  Make sure you have directions to the surgical center/hospital and know how to get there on time. 2. For hospital based surgery you will need to obtain a history and physical form from your family physician within 1 month prior to the date of surgery- we will give you a form for you primary physician.  3. We make every effort to accommodate the date you request for surgery.  There are however, times where surgery dates or times have to be moved.  We will contact you as soon as possible if a change in schedule is required.   4. No Aspirin/Ibuprofen for one week before surgery.  If you are on aspirin, any non-steroidal anti-inflammatory medications (Mobic, Aleve, Ibuprofen) you should stop taking it 7 days prior to your surgery.  You make take Tylenol  For pain prior to surgery.  5. Medications- If you are taking daily heart and blood pressure medications, seizure, reflux, allergy, asthma, anxiety, pain or diabetes medications, make sure the surgery center/hospital is aware before the day of surgery so they may notify you which medications to take or avoid the day of surgery. 6. No food or drink after midnight the night before surgery unless directed otherwise by surgical center/hospital staff. 7. No alcoholic beverages 24 hours prior to surgery.  No smoking 24 hours prior to or 24 hours after surgery. 8. Wear loose pants or shorts- loose enough to fit over bandages, boots, and casts. 9. No slip on shoes, sneakers are best. 10. Bring  your boot with you to the surgery center/hospital.  Also bring crutches or a walker if your physician has prescribed it for you.  If you do not have this equipment, it will be provided for you after surgery. 11. If you have not been contracted by the surgery center/hospital by the day before your surgery, call to confirm the date and time of your surgery. 12. Leave-time from work may vary depending on the type of surgery you have.  Appropriate arrangements should be made prior to surgery with your employer. 13. Prescriptions will be provided immediately following surgery by your doctor.  Have these filled as soon as possible after surgery and take the medication as directed. 14. Remove nail polish on the operative foot. 15. Wash the night before surgery.  The night before surgery wash the foot and leg well with the antibacterial soap provided and water paying special attention to beneath the toenails and in between the toes.  Rinse thoroughly with water and dry well with a towel.  Perform this wash unless told not to do so by your physician.  Enclosed: 1 Ice pack (please put in freezer the night before surgery)   1 Hibiclens skin cleaner   Pre-op Instructions  If you have any questions regarding the instructions, do not hesitate to call our office.  [place]: [address] [place], [place] [address] [phone]  [place]: [address] [place], [place] [address] [phone]  [place]: [address]  [place], [place] [address] [phone]  Dr. Richard   Tuchman DPM, Dr. Norman Regal DPM Dr. Richard Sikora DPM, Dr. M. Todd Hyatt DPM, Dr. Kathryn Egerton DPM 

## 2015-01-21 NOTE — Telephone Encounter (Signed)
"  They were calling about a surgery schedule.  I'd like for her to return my call so we can discuss this.    I'm returning your call.  "I can't do surgery on May 20th.  I would like to do it sooner than June 10th because I'm going out of town in August and wanted to be better by then."  We can do it on 02/07/2015.  "Okay, that sounds great let's put it there.  I'll check my schedule when I get home and will call if I need to change it."

## 2015-01-22 ENCOUNTER — Telehealth: Payer: Self-pay | Admitting: *Deleted

## 2015-01-22 NOTE — Telephone Encounter (Signed)
"  I'm calling to let you know that 05/27 is okay for me to have the surgery."  Okay, thanks for letting me know.

## 2015-01-31 ENCOUNTER — Other Ambulatory Visit: Payer: Self-pay | Admitting: Internal Medicine

## 2015-02-05 ENCOUNTER — Other Ambulatory Visit: Payer: Self-pay | Admitting: Internal Medicine

## 2015-02-12 ENCOUNTER — Other Ambulatory Visit: Payer: Self-pay | Admitting: Internal Medicine

## 2015-02-13 ENCOUNTER — Other Ambulatory Visit: Payer: Self-pay | Admitting: Podiatry

## 2015-02-13 MED ORDER — OXYCODONE-ACETAMINOPHEN 10-325 MG PO TABS: ORAL_TABLET | ORAL | Status: DC

## 2015-02-13 MED ORDER — CEPHALEXIN 500 MG PO CAPS: 500.0000 mg | ORAL_CAPSULE | Freq: Three times a day (TID) | ORAL | Status: DC

## 2015-02-13 MED ORDER — PROMETHAZINE HCL 25 MG PO TABS: 25.0000 mg | ORAL_TABLET | Freq: Three times a day (TID) | ORAL | Status: DC | PRN

## 2015-02-14 ENCOUNTER — Encounter: Payer: Self-pay | Admitting: *Deleted

## 2015-02-14 DIAGNOSIS — M25571 Pain in right ankle and joints of right foot: Secondary | ICD-10-CM | POA: Diagnosis not present

## 2015-02-14 DIAGNOSIS — I10 Essential (primary) hypertension: Secondary | ICD-10-CM | POA: Diagnosis not present

## 2015-02-14 DIAGNOSIS — M19071 Primary osteoarthritis, right ankle and foot: Secondary | ICD-10-CM | POA: Diagnosis not present

## 2015-02-14 DIAGNOSIS — M21541 Acquired clubfoot, right foot: Secondary | ICD-10-CM | POA: Diagnosis not present

## 2015-02-14 DIAGNOSIS — M216X1 Other acquired deformities of right foot: Secondary | ICD-10-CM | POA: Diagnosis not present

## 2015-02-14 DIAGNOSIS — M2011 Hallux valgus (acquired), right foot: Secondary | ICD-10-CM | POA: Diagnosis not present

## 2015-02-14 DIAGNOSIS — M79671 Pain in right foot: Secondary | ICD-10-CM | POA: Diagnosis not present

## 2015-02-20 ENCOUNTER — Ambulatory Visit (INDEPENDENT_AMBULATORY_CARE_PROVIDER_SITE_OTHER): Payer: Medicare Other

## 2015-02-20 ENCOUNTER — Encounter: Payer: Self-pay | Admitting: Podiatry

## 2015-02-20 ENCOUNTER — Telehealth: Payer: Self-pay | Admitting: *Deleted

## 2015-02-20 ENCOUNTER — Ambulatory Visit (INDEPENDENT_AMBULATORY_CARE_PROVIDER_SITE_OTHER): Payer: Medicare Other | Admitting: Podiatry

## 2015-02-20 VITALS — BP 166/86 | HR 81 | Temp 96.9°F | Resp 16

## 2015-02-20 DIAGNOSIS — Z9889 Other specified postprocedural states: Secondary | ICD-10-CM | POA: Diagnosis not present

## 2015-02-20 DIAGNOSIS — M19071 Primary osteoarthritis, right ankle and foot: Secondary | ICD-10-CM | POA: Diagnosis not present

## 2015-02-20 DIAGNOSIS — M2011 Hallux valgus (acquired), right foot: Secondary | ICD-10-CM

## 2015-02-20 NOTE — Telephone Encounter (Signed)
Pt's husband, Winfred called states pt fell on her left knee and hit the toes of the right surgical foot on the floor while entering the house.  I spoke with pt she states it had stinging initially, but not now.  Dr. Milinda Pointer states more than likely there is not damage, but if there were he would need to operate and that would not be anytime soon, but we will see her again next week and re-x-ray.  Dr. Milinda Pointer encouraged pt to continue pain medication and ice, rest and elevation and to get the other injured areas checked if necessary.  Pt states understanding.

## 2015-02-20 NOTE — Progress Notes (Signed)
She presents today for follow-up surgical foot left. She presents in her cast and states that she has done very well with little use of narcotics. She denies fever chills nausea vomiting muscle aches and pains.  Objective: Vital signs are stable she's alert and oriented 3. Cast is intact appears to be clean on the bottom radiographs confirm [facility] bunion repair second metatarsal osteotomy and a second metatarsal tarsal fusion. She has good range of motion of her toes and neurologically speaking they are all intact.  Assessment: Well-healing surgical foot left.  Plan: Remove cast in 1 week change dressing and reapply cast.

## 2015-02-27 ENCOUNTER — Encounter: Payer: Self-pay | Admitting: Podiatry

## 2015-02-27 ENCOUNTER — Ambulatory Visit (INDEPENDENT_AMBULATORY_CARE_PROVIDER_SITE_OTHER): Payer: Medicare Other | Admitting: Podiatry

## 2015-02-27 VITALS — BP 148/81 | HR 68 | Resp 16

## 2015-02-27 DIAGNOSIS — Z9889 Other specified postprocedural states: Secondary | ICD-10-CM

## 2015-02-27 DIAGNOSIS — M2011 Hallux valgus (acquired), right foot: Secondary | ICD-10-CM

## 2015-02-27 DIAGNOSIS — M19071 Primary osteoarthritis, right ankle and foot: Secondary | ICD-10-CM

## 2015-02-27 NOTE — Progress Notes (Signed)
She presents today for a follow-up of her tarsometatarsal fusion A second metatarsal osteotomy as well as a Austin bunion repair ulnar right foot. She presents today with her below-knee cast utilizing a knee scooter.  Objective: Vital signs are stable she is alert and oriented 3. Cast was removed demonstrating minimal edema no erythema saline as drainage or odor. Mild ecchymosis is noted to the surgical foot pulses are intact. Capillary fill time to digits one through 5 of the right foot was noted to be immediate.  Assessment: Well-healing surgical foot right.  Plan: Discussed etiology pathology conservative versus surgical therapies at this point we recasted her today and will follow up with her in 2 weeks. She is to remain nonweightbearing.

## 2015-02-28 ENCOUNTER — Other Ambulatory Visit: Payer: Self-pay | Admitting: Internal Medicine

## 2015-02-28 NOTE — Progress Notes (Signed)
Surgery at [facility], Metatarsal Osteotomy 2nd digit, Tarsal Metatarsal Fusion Right foot performed by Dr. Milinda Pointer

## 2015-03-13 ENCOUNTER — Encounter: Payer: Self-pay | Admitting: Podiatry

## 2015-03-13 ENCOUNTER — Ambulatory Visit (INDEPENDENT_AMBULATORY_CARE_PROVIDER_SITE_OTHER): Payer: Medicare Other

## 2015-03-13 ENCOUNTER — Ambulatory Visit (INDEPENDENT_AMBULATORY_CARE_PROVIDER_SITE_OTHER): Payer: Medicare Other | Admitting: Podiatry

## 2015-03-13 VITALS — BP 154/67 | HR 87 | Resp 16

## 2015-03-13 DIAGNOSIS — Z9889 Other specified postprocedural states: Secondary | ICD-10-CM

## 2015-03-13 DIAGNOSIS — M2011 Hallux valgus (acquired), right foot: Secondary | ICD-10-CM | POA: Diagnosis not present

## 2015-03-13 DIAGNOSIS — M19071 Primary osteoarthritis, right ankle and foot: Secondary | ICD-10-CM

## 2015-03-13 NOTE — Progress Notes (Signed)
She presents today for a follow-up of her tarsometatarsal fusion, second metatarsal osteotomy as well as a Austin bunion repair right foot. She presents today with her below-knee cast utilizing a knee scooter.  Objective: Vital signs are stable she is alert and oriented 3. Cast was removed demonstrating minimal edema no erythema saline as drainage or odor. Mild ecchymosis is noted to the surgical foot pulses are intact. Capillary fill time to digits one through 5 of the right foot was noted to be immediate. No open wound. No signs of infection.  Assessment: Well-healing surgical foot right.  Plan: Dispensed cam walker and an anklet for compression. She is to continue non weight bearing and using her scooter. Will follow up with her in 2 weeks.

## 2015-03-21 DIAGNOSIS — L738 Other specified follicular disorders: Secondary | ICD-10-CM | POA: Diagnosis not present

## 2015-04-03 ENCOUNTER — Encounter: Payer: Self-pay | Admitting: Podiatry

## 2015-04-03 ENCOUNTER — Ambulatory Visit (INDEPENDENT_AMBULATORY_CARE_PROVIDER_SITE_OTHER): Payer: Medicare Other | Admitting: Podiatry

## 2015-04-03 ENCOUNTER — Ambulatory Visit (INDEPENDENT_AMBULATORY_CARE_PROVIDER_SITE_OTHER): Payer: Medicare Other

## 2015-04-03 ENCOUNTER — Other Ambulatory Visit: Payer: Self-pay | Admitting: Internal Medicine

## 2015-04-03 DIAGNOSIS — Z9889 Other specified postprocedural states: Secondary | ICD-10-CM

## 2015-04-03 DIAGNOSIS — M2011 Hallux valgus (acquired), right foot: Secondary | ICD-10-CM | POA: Diagnosis not present

## 2015-04-03 NOTE — Progress Notes (Signed)
She presents today nearly 8 weeks status post Austin bunion repair second metatarsal osteotomy and a second tarsometatarsal fusion with screws. She states that currently he really doesn't hurt but I'm scared to walk on it because I'm thinking that it will hurt. She denies fever chills nausea vomiting muscle aches and pains or any trauma to the foot.  Objective: Vital signs are stable she is alert and oriented 3. Pulses are palpable right foot. Incision lines are gone on to heal uneventfully she's got a good range of motion of the first metatarsophalangeal joint however the second metatarsophalangeal joint appears to be slightly dorsiflexed secondary scar tissue. Radiographs confirm a well-healed osteotomy sites.  Assessment: Well-healing surgical foot.  Plan: I encouraged her start ambulation with the Cam Walker and I will follow-up with her in 2 weeks. As she was leaving the office she states that she was having pain and ambulating with the Cam Walker. I expressed to her that this should start to resolve over the next few days and to take anti-inflammatory step necessary. I will follow-up with her in 2 weeks for another set of x-rays.

## 2015-04-09 DIAGNOSIS — R300 Dysuria: Secondary | ICD-10-CM | POA: Diagnosis not present

## 2015-04-15 ENCOUNTER — Ambulatory Visit (INDEPENDENT_AMBULATORY_CARE_PROVIDER_SITE_OTHER): Payer: Medicare Other

## 2015-04-15 ENCOUNTER — Encounter: Payer: Self-pay | Admitting: Podiatry

## 2015-04-15 ENCOUNTER — Ambulatory Visit (INDEPENDENT_AMBULATORY_CARE_PROVIDER_SITE_OTHER): Payer: Medicare Other | Admitting: Podiatry

## 2015-04-15 VITALS — BP 134/72 | HR 90 | Resp 15

## 2015-04-15 DIAGNOSIS — M10272 Drug-induced gout, left ankle and foot: Secondary | ICD-10-CM | POA: Diagnosis not present

## 2015-04-15 DIAGNOSIS — M79672 Pain in left foot: Secondary | ICD-10-CM

## 2015-04-15 DIAGNOSIS — M1000 Idiopathic gout, unspecified site: Secondary | ICD-10-CM | POA: Diagnosis not present

## 2015-04-15 LAB — RHEUMATOID FACTOR: Rhuematoid fact SerPl-aCnc: <10 IU/mL (ref <=14)

## 2015-04-15 LAB — URIC ACID: Uric Acid, Serum: 6.6 mg/dL (ref 2.4–7.0)

## 2015-04-15 LAB — C-REACTIVE PROTEIN: CRP: 2.4 mg/dL — ABNORMAL HIGH (ref <0.60)

## 2015-04-15 MED ORDER — INDOMETHACIN 25 MG PO CAPS: 25.0000 mg | ORAL_CAPSULE | Freq: Two times a day (BID) | ORAL | Status: DC

## 2015-04-15 NOTE — Progress Notes (Signed)
   Subjective:    Patient ID: Teresa Rangel, female    DOB: 1941-01-10, 74 y.o.   MRN: [id number]  HPI Patient presents with foot pain in their left foot, ball of foot, swollen, red. When patient was standing, pt stated, "felt a little something move underneath big toe". This happened on Saturday April 12, 2015. Patient has iced foot and used ibuprofen with some relief. She doesn't recall any trauma. She continues to wear her Cam Walker for her surgical foot on the contralateral foot.   Review of Systems  All other systems reviewed and are negative.      Objective:   Physical Exam: I have reviewed her past medical history medications allergy surgeries and social history. Pulses remain palpable to the left foot. First metatarsophalangeal joint does demonstrate a mild deformity but is read swollen and warm to the touch. She has tenderness on range of motion of the toe radiographically does not do a straight any type of osseus abnormalities this area other than a bipartite tibial sesamoid. Soft tissue edema is noted on radiograph. Findings are consistent with gout.        Assessment & Plan:  Assessment: 74 year old Teresa Rangel female with symptoms of gouty arthritis first metatarsophalangeal joint left foot. Acute in nature.  Plan: I injected peri-articularly today with Kenalog and local anesthesia. Started her on Indocin 25 mg 1 by mouth twice a day and we also requested an arthritic profile. I will follow-up with her Thursday of this week for an evaluation of her surgical foot.

## 2015-04-16 LAB — SEDIMENTATION RATE: Sed Rate: 11 mm/hr (ref 0–30)

## 2015-04-16 LAB — ANA: ANA: NEGATIVE

## 2015-04-17 ENCOUNTER — Ambulatory Visit (INDEPENDENT_AMBULATORY_CARE_PROVIDER_SITE_OTHER): Payer: Medicare Other | Admitting: Podiatry

## 2015-04-17 ENCOUNTER — Ambulatory Visit (INDEPENDENT_AMBULATORY_CARE_PROVIDER_SITE_OTHER): Payer: Medicare Other

## 2015-04-17 ENCOUNTER — Encounter: Payer: Self-pay | Admitting: Podiatry

## 2015-04-17 VITALS — BP 166/76 | HR 71 | Resp 16

## 2015-04-17 DIAGNOSIS — M19071 Primary osteoarthritis, right ankle and foot: Secondary | ICD-10-CM | POA: Diagnosis not present

## 2015-04-17 DIAGNOSIS — M2011 Hallux valgus (acquired), right foot: Secondary | ICD-10-CM

## 2015-04-17 DIAGNOSIS — Z9889 Other specified postprocedural states: Secondary | ICD-10-CM

## 2015-04-17 DIAGNOSIS — M10272 Drug-induced gout, left ankle and foot: Secondary | ICD-10-CM

## 2015-04-17 NOTE — Progress Notes (Signed)
She presents today for follow-up of her surgical foot right date of surgery 02/14/2015. She also follows up for gouty arthritis and capsulitis first metatarsophalangeal joint left foot. She states that both feet are doing very well. She presents in a cam walker to her right foot in a regular shoe to the left foot.  Objective: Vital signs are stable she is alert and oriented 3. Pulses are palpable. Right foot doesn't straighten mild edema no erythema cellulitis drainage or odor no reproducible pain. She has good range of motion of the first and second metatarsophalangeal joints and a good fusion site at the second metatarsal intermediate cuneiform joint. Radiographs confirm this today. She has much decrease in erythema about the first metatarsophalangeal joint of the left foot. No tenderness upon range of motion.  Assessment well-healing surgical foot right well-healing gouty capsulitis left.  Plan: Placed her in a [place] shoe right foot 2 weeks which she will try to get into a regular tennis shoe over the next week or 2 and she will continue her antibiotic inflammatory for her left great toe. Follow up with her in 2 weeks.

## 2015-04-18 ENCOUNTER — Other Ambulatory Visit: Payer: Self-pay | Admitting: Internal Medicine

## 2015-04-18 DIAGNOSIS — N63 Unspecified lump in unspecified breast: Secondary | ICD-10-CM

## 2015-04-21 ENCOUNTER — Encounter: Payer: Self-pay | Admitting: Internal Medicine

## 2015-04-21 ENCOUNTER — Ambulatory Visit (INDEPENDENT_AMBULATORY_CARE_PROVIDER_SITE_OTHER): Payer: Medicare Other | Admitting: Internal Medicine

## 2015-04-21 VITALS — BP 150/88 | HR 69 | Temp 98.5°F | Resp 20 | Ht 63.75 in | Wt 170.0 lb

## 2015-04-21 DIAGNOSIS — E039 Hypothyroidism, unspecified: Secondary | ICD-10-CM | POA: Diagnosis not present

## 2015-04-21 DIAGNOSIS — E785 Hyperlipidemia, unspecified: Secondary | ICD-10-CM | POA: Diagnosis not present

## 2015-04-21 DIAGNOSIS — M810 Age-related osteoporosis without current pathological fracture: Secondary | ICD-10-CM

## 2015-04-21 DIAGNOSIS — I10 Essential (primary) hypertension: Secondary | ICD-10-CM | POA: Diagnosis not present

## 2015-04-21 DIAGNOSIS — R351 Nocturia: Secondary | ICD-10-CM

## 2015-04-21 LAB — POCT URINALYSIS DIPSTICK
Bilirubin, UA: NEGATIVE
Blood, UA: NEGATIVE
GLUCOSE UA: NEGATIVE
Ketones, UA: NEGATIVE
Nitrite, UA: NEGATIVE
PH UA: 6.5
Protein, UA: NEGATIVE
SPEC GRAV UA: 1.025
UROBILINOGEN UA: 0.2

## 2015-04-21 NOTE — Progress Notes (Signed)
Subjective:    Patient ID: Teresa Rangel, female    DOB: [birth year], 74 y.o.   MRN: [id number]  HPI  74 year old patient who was treated on July 20 for a UTI when she presented with immaturity.  She is still having some mild nocturia and was concerned about persistent infection.  UA today was unremarkable.  Since her last visit here, she has been treated for gout involving the left foot and has had extensive right foot surgery.  She was nonweightbearing for 2 months. She is scheduled for annual exam in October.  She has treated hypertension which includes a diuretic therapy.  Uric acid level was high normal  Past Medical History  Diagnosis Date  . Hypertension   . Hyperlipidemia   . Hypothyroidism   . History of stomach ulcers 1980    History   Social History  . Marital Status: Married    Spouse Name: N/A  . Number of Children: N/A  . Years of Education: N/A   Occupational History  . Not on file.   Social History Main Topics  . Smoking status: Never Smoker   . Smokeless tobacco: Never Used  . Alcohol Use: 0.6 oz/week    1 Glasses of wine per week  . Drug Use: No  . Sexual Activity:    Partners: Male   Other Topics Concern  . Not on file   Social History Narrative    Past Surgical History  Procedure Laterality Date  . Thyroidectomy  2007  . Appendectomy  1954  . Tonsillectomy  1949  . Laparoscopy  1982  . Colonoscopy      No family history on file.  Allergies  Allergen Reactions  . Fluzone [Flu Virus Vaccine] Other (See Comments)    Lethargic, muscle aches, flat on back for 3 days  . Tetracyclines & Related     Stomach cramps    Current Outpatient Prescriptions on File Prior to Visit  Medication Sig Dispense Refill  . amLODipine (NORVASC) 5 MG tablet TAKE 1 TABLET (5 MG TOTAL) BY MOUTH DAILY. 90 tablet 0  . cholecalciferol (VITAMIN D) 1000 UNITS tablet Take 2,000 Units by mouth daily.    . hydrochlorothiazide (HYDRODIURIL) 25 MG tablet TAKE ONE  TABLET BY MOUTH DAILY 90 tablet 0  . indomethacin (INDOCIN) 25 MG capsule Take 1 capsule (25 mg total) by mouth 2 (two) times daily with a meal. 30 capsule 1  . levothyroxine (SYNTHROID, LEVOTHROID) 125 MCG tablet TAKE 1 TABLET BY MOUTH DAILY 90 tablet 0  . pravastatin (PRAVACHOL) 20 MG tablet TAKE ONE TABLET BY MOUTH DAILY 90 tablet 0  . quinapril (ACCUPRIL) 40 MG tablet TAKE 1 TABLET (40 MG TOTAL) BY MOUTH AT BEDTIME. 90 tablet 1  . vitamin C (ASCORBIC ACID) 500 MG tablet Take 500 mg by mouth daily.    Marland Kitchen zolpidem (AMBIEN) 5 MG tablet TAKE 1 TABLET BY MOUTH AT BEDTIME AS NEEDED FOR SLEEP 30 tablet 2   No current facility-administered medications on file prior to visit.    BP 150/88 mmHg  Pulse 69  Temp(Src) 98.5 F (36.9 C) (Oral)  Resp 20  Ht 5' 3.75" (1.619 m)  Wt 170 lb (77.111 kg)  BMI 29.42 kg/m2  SpO2 98%     Review of Systems  Constitutional: Negative.   HENT: Negative for congestion, dental problem, hearing loss, rhinorrhea, sinus pressure, sore throat and tinnitus.   Eyes: Negative for pain, discharge and visual disturbance.  Respiratory: Negative for cough and  shortness of breath.   Cardiovascular: Negative for chest pain, palpitations and leg swelling.  Gastrointestinal: Negative for nausea, vomiting, abdominal pain, diarrhea, constipation, blood in stool and abdominal distention.  Genitourinary: Positive for frequency. Negative for dysuria, urgency, hematuria, flank pain, vaginal bleeding, vaginal discharge, difficulty urinating, vaginal pain and pelvic pain.  Musculoskeletal: Negative for joint swelling, arthralgias and gait problem.  Skin: Negative for rash.  Neurological: Negative for dizziness, syncope, speech difficulty, weakness, numbness and headaches.  Hematological: Negative for adenopathy.  Psychiatric/Behavioral: Negative for behavioral problems, dysphoric mood and agitation. The patient is not nervous/anxious.        Objective:   Physical Exam    Constitutional: She is oriented to person, place, and time. She appears well-developed and well-nourished.  HENT:  Head: Normocephalic.  Right Ear: External ear normal.  Left Ear: External ear normal.  Mouth/Throat: Oropharynx is clear and moist.  Eyes: Conjunctivae and EOM are normal. Pupils are equal, round, and reactive to light.  Neck: Normal range of motion. Neck supple. No thyromegaly present.  Cardiovascular: Normal rate, regular rhythm, normal heart sounds and intact distal pulses.   Pulmonary/Chest: Effort normal and breath sounds normal.  Abdominal: Soft. Bowel sounds are normal. She exhibits no mass. There is no tenderness.  Musculoskeletal: Normal range of motion.  Surgical scars right foot from prior surgery Resolving edema left foot  Lymphadenopathy:    She has no cervical adenopathy.  Neurological: She is alert and oriented to person, place, and time.  Skin: Skin is warm and dry. No rash noted.  Psychiatric: She has a normal mood and affect. Her behavior is normal.          Assessment & Plan:   Hypertension History probable gout History of UTI.  We'll recheck in October.  At the time of her annual exam

## 2015-04-21 NOTE — Patient Instructions (Addendum)
Take a calcium supplement, plus [id number] units of vitamin D    It is important that you exercise regularly, at least 20 minutes 3 to 4 times per week.  If you develop chest pain or shortness of breath seek  medical attention.  Low-Purine Diet Purines are compounds that affect the level of uric acid in your body. A low-purine diet is a diet that is low in purines. Eating a low-purine diet can prevent the level of uric acid in your body from getting too high and causing gout or kidney stones or both. WHAT DO I NEED TO KNOW ABOUT THIS DIET?  Choose low-purine foods. Examples of low-purine foods are listed in the next section.  Drink plenty of fluids, especially water. Fluids can help remove uric acid from your body. Try to drink 8-16 cups (1.9-3.8 L) a day.  Limit foods high in fat, especially saturated fat, as fat makes it harder for the body to get rid of uric acid. Foods high in saturated fat include pizza, cheese, ice cream, whole milk, fried foods, and gravies. Choose foods that are lower in fat and lean sources of protein. Use olive oil when cooking as it contains healthy fats that are not high in saturated fat.  Limit alcohol. Alcohol interferes with the elimination of uric acid from your body. If you are having a gout attack, avoid all alcohol.  Keep in mind that different people's bodies react differently to different foods. You will probably learn over time which foods do or do not affect you. If you discover that a food tends to cause your gout to flare up, avoid eating that food. You can more freely enjoy foods that do not cause problems. If you have any questions about a food item, talk to your dietitian or health care provider. WHICH FOODS ARE LOW, MODERATE, AND HIGH IN PURINES? The following is a list of foods that are low, moderate, and high in purines. You can eat any amount of the foods that are low in purines. You may be able to have small amounts of foods that are moderate in  purines. Ask your health care provider how much of a food moderate in purines you can have. Avoid foods high in purines. Grains  Foods low in purines: Enriched [place] bread, pasta, rice, cake, cornbread, popcorn.  Foods moderate in purines: Whole-grain breads and cereals, wheat germ, bran, oatmeal. Uncooked oatmeal. Dry wheat bran or wheat germ.  Foods high in purines: Pancakes, [place] toast, biscuits, muffins. Vegetables  Foods low in purines: All vegetables, except those that are moderate in purines.  Foods moderate in purines: Asparagus, cauliflower, spinach, mushrooms, green peas. Fruits  All fruits are low in purines. Meats and other Protein Foods  Foods low in purines: Eggs, nuts, peanut butter.  Foods moderate in purines: 80-90% lean beef, lamb, veal, pork, poultry, fish, eggs, peanut butter, nuts. Crab, lobster, oysters, and shrimp. Cooked dried beans, peas, and lentils.  Foods high in purines: Anchovies, sardines, herring, mussels, tuna, codfish, scallops, trout, and haddock. Teresa Rangel. Organ meats (such as liver or kidney). Tripe. Game meat. Goose. Sweetbreads. Dairy  All dairy foods are low in purines. Low-fat and fat-free dairy products are best because they are low in saturated fat. Beverages  Drinks low in purines: Water, carbonated beverages, tea, coffee, cocoa.  Drinks moderate in purines: Soft drinks and other drinks sweetened with high-fructose corn syrup. Juices. To find whether a food or drink is sweetened with high-fructose corn syrup, look at the  ingredients list.  Drinks high in purines: Alcoholic beverages (such as beer). Condiments  Foods low in purines: Salt, herbs, olives, pickles, relishes, vinegar.  Foods moderate in purines: Butter, margarine, oils, mayonnaise. Fats and Oils  Foods low in purines: All types, except gravies and sauces made with meat.  Foods high in purines: Gravies and sauces made with meat. Other Foods  Foods low in purines:  Sugars, sweets, gelatin. Cake. Soups made without meat.  Foods moderate in purines: Meat-based or fish-based soups, broths, or bouillons. Foods and drinks sweetened with high-fructose corn syrup.  Foods high in purines: High-fat desserts (such as ice cream, cookies, cakes, pies, doughnuts, and chocolate). Contact your dietitian for more information on foods that are not listed here. Document Released: 12/25/2010 Document Revised: 09/04/2013 Document Reviewed: 08/06/2013 [facility] Patient Information 2015 [place], [place]. This information is not intended to replace advice given to you by your health care provider. Make sure you discuss any questions you have with your health care provider.

## 2015-04-21 NOTE — Progress Notes (Signed)
Pre visit review using our clinic review tool, if applicable. No additional management support is needed unless otherwise documented below in the visit note. 

## 2015-04-23 ENCOUNTER — Ambulatory Visit (INDEPENDENT_AMBULATORY_CARE_PROVIDER_SITE_OTHER)
Admission: RE | Admit: 2015-04-23 | Discharge: 2015-04-23 | Disposition: A | Discharge status: Home / Self Care | Payer: Medicare Other | Source: Ambulatory Visit | Attending: Internal Medicine | Admitting: Internal Medicine

## 2015-04-23 DIAGNOSIS — M810 Age-related osteoporosis without current pathological fracture: Secondary | ICD-10-CM | POA: Diagnosis not present

## 2015-04-25 ENCOUNTER — Other Ambulatory Visit: Payer: Self-pay | Admitting: Podiatry

## 2015-05-07 ENCOUNTER — Telehealth: Payer: Self-pay | Admitting: Internal Medicine

## 2015-05-07 NOTE — Telephone Encounter (Signed)
Spoke to pt, told her bone density was normal. Involving her spine that revealed very mild osteopenia involving her hip area. Continue calcium, vitamin D and exercise per Dr. Raliegh Ip. Pt verbalized understanding.

## 2015-05-07 NOTE — Telephone Encounter (Signed)
Pt would like a call back about the results of her bone density test

## 2015-05-07 NOTE — Telephone Encounter (Signed)
Please call patient and notify her that her bone density was normal.  Involving her spine that revealed very mild osteopenia involving her hip area.  Continue calcium, vitamin D and exercise

## 2015-05-07 NOTE — Telephone Encounter (Signed)
Pt calling for Bone Density results, please advise.

## 2015-05-08 ENCOUNTER — Encounter: Payer: Self-pay | Admitting: Podiatry

## 2015-05-08 ENCOUNTER — Ambulatory Visit (INDEPENDENT_AMBULATORY_CARE_PROVIDER_SITE_OTHER): Payer: Medicare Other | Admitting: Podiatry

## 2015-05-08 ENCOUNTER — Ambulatory Visit (INDEPENDENT_AMBULATORY_CARE_PROVIDER_SITE_OTHER): Payer: Medicare Other

## 2015-05-08 VITALS — BP 132/72 | HR 89 | Resp 16

## 2015-05-08 DIAGNOSIS — Z9889 Other specified postprocedural states: Secondary | ICD-10-CM

## 2015-05-08 DIAGNOSIS — M2011 Hallux valgus (acquired), right foot: Secondary | ICD-10-CM | POA: Diagnosis not present

## 2015-05-08 DIAGNOSIS — M19071 Primary osteoarthritis, right ankle and foot: Secondary | ICD-10-CM | POA: Diagnosis not present

## 2015-05-09 ENCOUNTER — Other Ambulatory Visit: Payer: Self-pay | Admitting: Internal Medicine

## 2015-05-09 NOTE — Progress Notes (Signed)
Teresa Rangel presents today for follow-up of her surgical foot right she is status post Austin bunion repair with screw and pin fixation first right second metatarsal osteotomy with double screw fixation as well as a second metatarsal intermediate cuneiform arthrodesis. She states that she seems to be doing very well with this and no foot pain. Date of surgery February 14 2015.     objective: vital signs are stable alert and oriented 3. Pulses are strongly palpable. Great range of motion of the first metatarsophalangeal joint no pain on range of motion of the second metatarsophalangeal joint or of attempted range of motion at the arthrodesis site. Radiographs taken today 3 views of the right foot demonstrate no osseous abnormalities in this area. All the internal fixation appears to be intact and completely arthrodesis and healing of all osteotomy sites and fusion sites. Cutaneous evaluation of a straight supple well-hydrated cutis no erythema edema cellulitis drainage or odor.  assessment: well-healing surgical foot. Plan: I encouraged her to get back into regular shoes. I will follow-up with her in 1 month   Roselind Messier DPM

## 2015-05-16 ENCOUNTER — Ambulatory Visit
Admission: RE | Admit: 2015-05-16 | Discharge: 2015-05-16 | Disposition: A | Discharge status: Home / Self Care | Payer: Medicare Other | Source: Ambulatory Visit | Attending: Internal Medicine | Admitting: Internal Medicine

## 2015-05-16 DIAGNOSIS — N63 Unspecified lump in unspecified breast: Secondary | ICD-10-CM

## 2015-05-16 DIAGNOSIS — R928 Other abnormal and inconclusive findings on diagnostic imaging of breast: Secondary | ICD-10-CM | POA: Diagnosis not present

## 2015-06-12 ENCOUNTER — Encounter: Payer: Self-pay | Admitting: Podiatry

## 2015-06-12 ENCOUNTER — Ambulatory Visit (INDEPENDENT_AMBULATORY_CARE_PROVIDER_SITE_OTHER): Payer: Medicare Other | Admitting: Podiatry

## 2015-06-12 ENCOUNTER — Ambulatory Visit (INDEPENDENT_AMBULATORY_CARE_PROVIDER_SITE_OTHER): Payer: Medicare Other

## 2015-06-12 VITALS — BP 142/71 | HR 75 | Resp 16

## 2015-06-12 DIAGNOSIS — M19071 Primary osteoarthritis, right ankle and foot: Secondary | ICD-10-CM | POA: Diagnosis not present

## 2015-06-12 DIAGNOSIS — M2011 Hallux valgus (acquired), right foot: Secondary | ICD-10-CM | POA: Diagnosis not present

## 2015-06-12 DIAGNOSIS — Z9889 Other specified postprocedural states: Secondary | ICD-10-CM | POA: Diagnosis not present

## 2015-06-12 NOTE — Progress Notes (Signed)
She presents today nearly 4 months status post Austin repair hammertoe repair second right and the second metatarsal cuneiform fusion. She is doing very well however she is concerned she is walking laterally with an abducted gait. She states that she is concerned that she is she will over her toes that it may hurt.    Objective : vital signs are stable she is alert and oriented 3. No erythema edema cellulitis drainage or odor right foot. Pulses are strong and palpable pain. She has great range of motion of the first and second metatarsophalangeal joints. Radiographs demonstrated completely fused or arthrodesis second metatarsal cuneiform fusion site.  Assessment: When surgical foot right.  Plan: I will follow-up with her in 2 months for her final set of x-rays.

## 2015-07-03 ENCOUNTER — Encounter: Payer: Medicare Other | Admitting: Internal Medicine

## 2015-07-09 ENCOUNTER — Encounter: Payer: Medicare Other | Admitting: Internal Medicine

## 2015-07-11 ENCOUNTER — Encounter: Payer: Self-pay | Admitting: Internal Medicine

## 2015-07-11 ENCOUNTER — Ambulatory Visit (INDEPENDENT_AMBULATORY_CARE_PROVIDER_SITE_OTHER): Payer: Medicare Other | Admitting: Internal Medicine

## 2015-07-11 VITALS — BP 146/100 | HR 80 | Temp 98.2°F | Resp 16 | Ht 63.5 in | Wt 170.0 lb

## 2015-07-11 DIAGNOSIS — E034 Atrophy of thyroid (acquired): Secondary | ICD-10-CM

## 2015-07-11 DIAGNOSIS — E785 Hyperlipidemia, unspecified: Secondary | ICD-10-CM | POA: Diagnosis not present

## 2015-07-11 DIAGNOSIS — Z0000 Encounter for general adult medical examination without abnormal findings: Secondary | ICD-10-CM

## 2015-07-11 DIAGNOSIS — E038 Other specified hypothyroidism: Secondary | ICD-10-CM

## 2015-07-11 DIAGNOSIS — I10 Essential (primary) hypertension: Secondary | ICD-10-CM

## 2015-07-11 LAB — COMPREHENSIVE METABOLIC PANEL
ALBUMIN: 4.1 g/dL (ref 3.5–5.2)
ALT: 18 U/L (ref 0–35)
AST: 18 U/L (ref 0–37)
Alkaline Phosphatase: 81 U/L (ref 39–117)
BILIRUBIN TOTAL: 0.6 mg/dL (ref 0.2–1.2)
BUN: 16 mg/dL (ref 6–23)
CALCIUM: 9.5 mg/dL (ref 8.4–10.5)
CO2: 29 meq/L (ref 19–32)
CREATININE: 0.82 mg/dL (ref 0.40–1.20)
Chloride: 106 mEq/L (ref 96–112)
GFR: 72.14 mL/min (ref >60.00)
Glucose, Bld: 100 mg/dL — ABNORMAL HIGH (ref 70–99)
Potassium: 4.3 mEq/L (ref 3.5–5.1)
SODIUM: 147 meq/L — AB (ref 135–145)
Total Protein: 6.8 g/dL (ref 6.0–8.3)

## 2015-07-11 LAB — LIPID PANEL
CHOLESTEROL: 214 mg/dL — AB (ref 0–200)
HDL: 51.6 mg/dL (ref >39.00)
LDL Cholesterol: 136 mg/dL — ABNORMAL HIGH (ref 0–99)
NonHDL: 161.95
TRIGLYCERIDES: 131 mg/dL (ref 0.0–149.0)
Total CHOL/HDL Ratio: 4
VLDL: 26.2 mg/dL (ref 0.0–40.0)

## 2015-07-11 LAB — CBC WITH DIFFERENTIAL/PLATELET
BASOS ABS: 0 10*3/uL (ref 0.0–0.1)
Basophils Relative: 0.6 % (ref 0.0–3.0)
EOS ABS: 0.3 10*3/uL (ref 0.0–0.7)
Eosinophils Relative: 4.5 % (ref 0.0–5.0)
HEMATOCRIT: 43.2 % (ref 36.0–46.0)
Hemoglobin: 14.8 g/dL (ref 12.0–15.0)
LYMPHS PCT: 29.2 % (ref 12.0–46.0)
Lymphs Abs: 1.7 10*3/uL (ref 0.7–4.0)
MCHC: 34.3 g/dL (ref 30.0–36.0)
MCV: 84.7 fl (ref 78.0–100.0)
MONO ABS: 0.5 10*3/uL (ref 0.1–1.0)
Monocytes Relative: 7.9 % (ref 3.0–12.0)
NEUTROS ABS: 3.4 10*3/uL (ref 1.4–7.7)
Neutrophils Relative %: 57.8 % (ref 43.0–77.0)
PLATELETS: 227 10*3/uL (ref 150.0–400.0)
RBC: 5.1 Mil/uL (ref 3.87–5.11)
RDW: 13.2 % (ref 11.5–15.5)
WBC: 5.9 10*3/uL (ref 4.0–10.5)

## 2015-07-11 LAB — TSH: TSH: 2.23 u[IU]/mL (ref 0.35–4.50)

## 2015-07-11 NOTE — Progress Notes (Signed)
Pre visit review using our clinic review tool, if applicable. No additional management support is needed unless otherwise documented below in the visit note. 

## 2015-07-11 NOTE — Patient Instructions (Signed)
Limit your sodium (Salt) intake    It is important that you exercise regularly, at least 20 minutes 3 to 4 times per week.  If you develop chest pain or shortness of breath seek  medical attention.  You need to lose weight.  Consider a lower calorie diet and regular exercise.  Please check your blood pressure on a regular basis.  If it is consistently greater than 150/90, please make an office appointment.  Return in one year for follow-up  Take a calcium supplement, plus [id number] units of vitamin D

## 2015-07-11 NOTE — Progress Notes (Signed)
Subjective:    Patient ID: Teresa Rangel, female    DOB: 06-01-1941, 74 y.o.   MRN: [id number]  HPI 74 year-old patient who is seen today for a preventive health examination. She has long history of hypertension that has been controlled on 3 drugs. She states that she has a remote history of fibromyalgia and restless leg syndrome.  She states that she occasionally misses some of her daily medications  Past medical history she has had thyroid surgery for benign tumor and a remote appendectomy in 1955;  she had a colonoscopy  and followup in 2014. She has remote history depression.  Family history father died at 37 complications of COPD and coronary artery disease Mother died at 66 in good health and died in a motor vehicle accident. Paternal grandmother with diabetes;  one brother in good health one sister with cerebrovascular disease  Social history relocated from [place] about 3 years ago. 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 Nonsmoker  BP Readings from Last 3 Encounters:  07/11/15 146/100  06/12/15 142/71  05/08/15 132/72    Wt Readings from Last 3 Encounters:  07/11/15 170 lb (77.111 kg)  04/21/15 170 lb (77.111 kg)  02/26/14 172 lb (78.019 kg)    1. Risk factors, based on past  M,S,F history-  cardiovascular risk factors include a history of hypertension 2.  Physical activities: Remains quite active; health club 3 times weekly  3.  Depression/mood: Remote history depression has been on Zoloft briefly in the past  4.  Hearing: No deficits  5.  ADL's: Independent in all aspects of daily living  6.  Fall risk: Low   7.  Home safety: No problems identified  8.  Height weight, and visual acuity; height and weight stable no change in visual acuity  9.  Counseling: Not appropriate at this time  10. Lab orders based on risk factors: Will review in the spring  11. Referral : Not appropriate at this time  12. Care plan: We'll add amlodipine to her blood pressure regimen  continue home blood pressure monitoring 13. Cognitive assessment: Alert and oriented normal affect. No cognitive dysfunction 14.  Preventive services will include an annual clinical exam was screening lab.  She will continue to have annual mammograms.  Will consider a follow-up bone density study in 2 years.  Annual eye examinations recommended 15.  Provider list includes radiology primary care medicine and ophthalmology    Review of Systems  Constitutional: Negative for fever, appetite change, fatigue and unexpected weight change.  HENT: Positive for congestion and sinus pressure. Negative for dental problem, ear pain, hearing loss, mouth sores, nosebleeds, sore throat, tinnitus, trouble swallowing and voice change.   Eyes: Negative for photophobia, pain, redness and visual disturbance.  Respiratory: Negative for cough, chest tightness and shortness of breath.   Cardiovascular: Negative for chest pain, palpitations and leg swelling.  Gastrointestinal: Negative for nausea, vomiting, abdominal pain, diarrhea, constipation, blood in stool, abdominal distention and rectal pain.  Genitourinary: Negative for dysuria, urgency, frequency, hematuria, flank pain, vaginal bleeding, vaginal discharge, difficulty urinating, genital sores, vaginal pain, menstrual problem and pelvic pain.  Musculoskeletal: Negative for back pain, arthralgias and neck stiffness.  Skin: Negative for rash.  Neurological: Positive for light-headedness. Negative for dizziness, syncope, speech difficulty, weakness, numbness and headaches.  Hematological: Negative for adenopathy. Does not bruise/bleed easily.  Psychiatric/Behavioral: Positive for sleep disturbance. Negative for suicidal ideas, behavioral problems, self-injury, dysphoric mood and agitation. The patient is not nervous/anxious.  Objective:   Physical Exam  Constitutional: She is oriented to person, place, and time. She appears well-developed and  well-nourished.  HENT:  Head: Normocephalic.  Right Ear: External ear normal.  Left Ear: External ear normal.  Mouth/Throat: Oropharynx is clear and moist.  Eyes: Conjunctivae and EOM are normal. Pupils are equal, round, and reactive to light.  Neck: Normal range of motion. Neck supple. No thyromegaly present.  Thyroidectomy scar  Cardiovascular: Normal rate, regular rhythm, normal heart sounds and intact distal pulses.   Pulmonary/Chest: Effort normal and breath sounds normal.  Abdominal: Soft. Bowel sounds are normal. She exhibits no mass. There is no tenderness.  Musculoskeletal: Normal range of motion.  Recent surgical scar dorsal right foot  Lymphadenopathy:    She has no cervical adenopathy.  Neurological: She is alert and oriented to person, place, and time.  Skin: Skin is warm and dry. No rash noted.  Psychiatric: She has a normal mood and affect. Her behavior is normal.          Assessment & Plan:  Preventive health exam Hypertension .  Compliance with triple therapy.  Discussed Hypothyroidism.  We'll continue levothyroxin Dyslipidemia.  Continue statin therapy   We'll check screening labs Weight loss encouraged.  Has resumed Weight [employer] program Medications updated Recheck one year or as needed

## 2015-08-03 DIAGNOSIS — J209 Acute bronchitis, unspecified: Secondary | ICD-10-CM | POA: Diagnosis not present

## 2015-08-03 DIAGNOSIS — J9801 Acute bronchospasm: Secondary | ICD-10-CM | POA: Diagnosis not present

## 2015-08-08 ENCOUNTER — Other Ambulatory Visit: Payer: Self-pay | Admitting: Internal Medicine

## 2015-08-14 ENCOUNTER — Ambulatory Visit: Payer: Medicare Other | Admitting: Podiatry

## 2015-08-19 ENCOUNTER — Ambulatory Visit: Payer: Medicare Other | Admitting: Podiatry

## 2015-08-21 ENCOUNTER — Encounter: Payer: Self-pay | Admitting: Podiatry

## 2015-08-21 ENCOUNTER — Ambulatory Visit (INDEPENDENT_AMBULATORY_CARE_PROVIDER_SITE_OTHER): Payer: Medicare Other | Admitting: Podiatry

## 2015-08-21 ENCOUNTER — Ambulatory Visit (INDEPENDENT_AMBULATORY_CARE_PROVIDER_SITE_OTHER): Payer: Medicare Other

## 2015-08-21 VITALS — BP 151/77 | HR 83 | Resp 16

## 2015-08-21 DIAGNOSIS — M19071 Primary osteoarthritis, right ankle and foot: Secondary | ICD-10-CM | POA: Diagnosis not present

## 2015-08-21 DIAGNOSIS — M2011 Hallux valgus (acquired), right foot: Secondary | ICD-10-CM

## 2015-08-21 DIAGNOSIS — Z9889 Other specified postprocedural states: Secondary | ICD-10-CM | POA: Diagnosis not present

## 2015-08-21 NOTE — Progress Notes (Signed)
Kameela presents today for her final postop visit right foot. Date of surgery 02/14/2015. She had a Lisfranc separation repair with fusion arthrodesis of the second metatarsal intermediate cuneiform joint. An Austin bunion repair with K wire fixation and a second metatarsal osteotomy with screw fixation. She has eventually gone on to arthrodesis and diffuse 100% and has no pain on ambulation.  Objective: Vital signs are stable she is alert and oriented 3 no erythema edema cellulitis drainage or odor right range of motion of the first and second metatarsophalangeal joints no hypermobility or pain on range of motion at the second metatarsal intermediate cuneiform joint or the first metatarsal intermediate cuneiform joint. Radiographs demonstrate complete 100% arthrodesis and healing of the bones.  Assessment: Well-healing surgical foot  Plan: All up with me on an as-needed basis remember to thank her for the cookies.

## 2015-09-01 ENCOUNTER — Other Ambulatory Visit: Payer: Self-pay

## 2015-09-01 DIAGNOSIS — Z1231 Encounter for screening mammogram for malignant neoplasm of breast: Secondary | ICD-10-CM

## 2015-09-05 ENCOUNTER — Other Ambulatory Visit: Payer: Self-pay | Admitting: Internal Medicine

## 2015-09-12 DIAGNOSIS — L720 Epidermal cyst: Secondary | ICD-10-CM | POA: Diagnosis not present

## 2015-09-12 DIAGNOSIS — L853 Xerosis cutis: Secondary | ICD-10-CM | POA: Diagnosis not present

## 2015-09-12 DIAGNOSIS — L821 Other seborrheic keratosis: Secondary | ICD-10-CM | POA: Diagnosis not present

## 2015-09-12 DIAGNOSIS — D225 Melanocytic nevi of trunk: Secondary | ICD-10-CM | POA: Diagnosis not present

## 2015-09-12 DIAGNOSIS — L905 Scar conditions and fibrosis of skin: Secondary | ICD-10-CM | POA: Diagnosis not present

## 2015-09-12 DIAGNOSIS — L814 Other melanin hyperpigmentation: Secondary | ICD-10-CM | POA: Diagnosis not present

## 2015-09-22 ENCOUNTER — Other Ambulatory Visit: Payer: Self-pay | Admitting: Internal Medicine

## 2015-11-10 ENCOUNTER — Ambulatory Visit
Admission: RE | Admit: 2015-11-10 | Discharge: 2015-11-10 | Disposition: A | Discharge status: Home / Self Care | Payer: Medicare Other | Source: Ambulatory Visit

## 2015-11-10 DIAGNOSIS — Z1231 Encounter for screening mammogram for malignant neoplasm of breast: Secondary | ICD-10-CM

## 2015-11-13 ENCOUNTER — Encounter: Payer: Self-pay | Admitting: Family Medicine

## 2015-11-13 ENCOUNTER — Ambulatory Visit (INDEPENDENT_AMBULATORY_CARE_PROVIDER_SITE_OTHER): Payer: Medicare Other | Admitting: Family Medicine

## 2015-11-13 VITALS — BP 144/80 | HR 90 | Temp 98.3°F | Wt 169.7 lb

## 2015-11-13 DIAGNOSIS — Z825 Family history of asthma and other chronic lower respiratory diseases: Secondary | ICD-10-CM | POA: Diagnosis not present

## 2015-11-13 DIAGNOSIS — J069 Acute upper respiratory infection, unspecified: Secondary | ICD-10-CM | POA: Diagnosis not present

## 2015-11-13 DIAGNOSIS — Z8709 Personal history of other diseases of the respiratory system: Secondary | ICD-10-CM | POA: Diagnosis not present

## 2015-11-13 DIAGNOSIS — J029 Acute pharyngitis, unspecified: Secondary | ICD-10-CM | POA: Diagnosis not present

## 2015-11-13 LAB — POCT INFLUENZA A/B
INFLUENZA B, POC: NEGATIVE
Influenza A, POC: NEGATIVE

## 2015-11-13 LAB — POCT RAPID STREP A (OFFICE): RAPID STREP A SCREEN: NEGATIVE

## 2015-11-13 MED ORDER — ALBUTEROL SULFATE HFA 108 (90 BASE) MCG/ACT IN AERS: 2.0000 | INHALATION_SPRAY | Freq: Four times a day (QID) | RESPIRATORY_TRACT | Status: DC | PRN

## 2015-11-13 NOTE — Progress Notes (Signed)
Subjective:    Patient ID: Teresa Rangel, female    DOB: 02-19-1941, 75 y.o.   MRN: [id number]  HPI  Ms. Pinkstaff is a 75 year old female who presents today with a 24 hour history of chills, sore throat, PND, and nonproductive cough. Associated symptoms of body aches, congestion, and patient report of wheezing are noted. She denies any sinus pressure/pain, dental pain, ear pressure/pain, GERD symptoms, N/V/D or recent antibiotic use. Within the past 3 weeks, her husband was hospitalized for sepsis and is currently at home on antibiotic therapy. Also, she teaches piano at a local school and has been exposed to children who may have been ill. Pertinent history includes a recent episode of bronchitis per patient report that was treated at a local urgent care center. Also, she notes a family history of asthma. Treatments at home include Flonase, and ibuprofen which have provided minimal benefit. Patient notes that she has used albuterol once since symptoms have started with moderate benefit.  Review of Systems  Constitutional: Positive for chills. Negative for fever.  HENT: Positive for congestion, postnasal drip, rhinorrhea and sore throat. Negative for sinus pressure and sneezing.   Respiratory: Positive for cough and wheezing. Negative for chest tightness.   Cardiovascular: Negative for chest pain and palpitations.  Gastrointestinal: Negative for nausea, vomiting, abdominal pain and constipation.  Genitourinary: Negative for dysuria, frequency, hematuria and flank pain.  Musculoskeletal:       Patient notes generalized body aches  Skin: Negative for rash.  Neurological: Negative for dizziness, light-headedness and headaches.   Past Medical History  Diagnosis Date  . Hypertension   . Hyperlipidemia   . Hypothyroidism   . History of stomach ulcers 1980    Social History   Social History  . Marital Status: Married    Spouse Name: N/A  . Number of Children: N/A  . Years of Education: N/A    Occupational History  . Not on file.   Social History Main Topics  . Smoking status: Never Smoker   . Smokeless tobacco: Never Used  . Alcohol Use: 0.6 oz/week    1 Glasses of wine per week  . Drug Use: No  . Sexual Activity:    Partners: Male   Other Topics Concern  . Not on file   Social History Narrative    Past Surgical History  Procedure Laterality Date  . Thyroidectomy  2007  . Appendectomy  1954  . Tonsillectomy  1949  . Laparoscopy  1982  . Colonoscopy      No family history on file.  Allergies  Allergen Reactions  . Fluzone [Flu Virus Vaccine] Other (See Comments)    Lethargic, muscle aches, flat on back for 3 days  . Tetracyclines & Related     Stomach cramps    Current Outpatient Prescriptions on File Prior to Visit  Medication Sig Dispense Refill  . amLODipine (NORVASC) 5 MG tablet TAKE 1 TABLET (5 MG TOTAL) BY MOUTH DAILY. 90 tablet 1  . cholecalciferol (VITAMIN D) 1000 UNITS tablet Take 2,000 Units by mouth daily.    . hydrochlorothiazide (HYDRODIURIL) 25 MG tablet TAKE ONE TABLET BY MOUTH DAILY 90 tablet 0  . levothyroxine (SYNTHROID, LEVOTHROID) 125 MCG tablet TAKE 1 TABLET BY MOUTH DAILY 90 tablet 0  . pravastatin (PRAVACHOL) 20 MG tablet TAKE ONE TABLET BY MOUTH DAILY 90 tablet 3  . quinapril (ACCUPRIL) 40 MG tablet TAKE 1 TABLET (40 MG TOTAL) BY MOUTH AT BEDTIME. 90 tablet 1  .  vitamin C (ASCORBIC ACID) 500 MG tablet Take 500 mg by mouth daily.    Marland Kitchen zolpidem (AMBIEN) 5 MG tablet TAKE 1 TABLET BY MOUTH AT BEDTIME AS NEEDED FOR SLEEP 30 tablet 2   No current facility-administered medications on file prior to visit.    BP 144/80 mmHg  Pulse 90  Temp(Src) 98.3 F (36.8 C) (Oral)  Wt 169 lb 11.2 oz (76.975 kg)      Objective:   Physical Exam  Constitutional: She is oriented to person, place, and time. She appears well-developed.  HENT:  Right Ear: Tympanic membrane normal.  Left Ear: Tympanic membrane normal.  Nose: Rhinorrhea  present. Right sinus exhibits no maxillary sinus tenderness and no frontal sinus tenderness. Left sinus exhibits no maxillary sinus tenderness and no frontal sinus tenderness.  Mouth/Throat: Posterior oropharyngeal erythema present. No oropharyngeal exudate.  Erythematous nares   Eyes: Pupils are equal, round, and reactive to light.  Cardiovascular: Normal rate, regular rhythm and normal heart sounds.   Pulmonary/Chest: Effort normal and breath sounds normal. She has no wheezes.  Lymphadenopathy:    She has no cervical adenopathy.  Neurological: She is alert and oriented to person, place, and time.  Skin: Skin is warm and dry. No rash noted.  Psychiatric: She has a normal mood and affect. Her behavior is normal.      Assessment & Plan:   1. Sore throat Negative screen for rapid strep. Patient report of chills, sore throat, and recent exposure to children, and caring for husband with recent diagnosis of sepsis with a 7 day hospitalization led to screen for strep. Treat supportively with Tylenol and avoid ibuprofen due to history of HTN.  - POCT rapid strep A  2. Acute upper respiratory infection Negative screen for influenza. Patient did not receive the flu vaccine this season. Recent extended time caring for husband in the hospital setting noted as a risk factor for flu.  - POCT Influenza A/B Symptom management and treatment with Mucinex DM for cough as needed and Tylenol for discomfort. Continue use of flonase once/day and advised patient to increase fluids, rest, and RTC if symptoms do not improve in 3-4 days, worsen, or she develops a fever >101.  3. History of bronchitis Recent history of bronchitis within the previous 3 months per patient report that was treated at an urgent care. Albuterol provided to be used on a limited basis as needed for wheezing due to history of reactive airway. Advised patient to contact clinic for further evaluation if she notes using this more than 2 times/day  during this episode. Discussed with patient that this is a rescue medication and should not be used on a daily basis. Also advised patient the negative adverse events such as increased heart rate and BP that can occur. Patient voiced understanding and agreed with plan.  - albuterol (PROVENTIL HFA;VENTOLIN HFA) 108 (90 Base) MCG/ACT inhaler; Inhale 2 puffs into the lungs every 6 (six) hours as needed for wheezing or shortness of breath.  Dispense: 1 Inhaler; Refill: 0  4. Family history of asthma  - albuterol (PROVENTIL HFA;VENTOLIN HFA) 108 (90 Base) MCG/ACT inhaler; Inhale 2 puffs into the lungs every 6 (six) hours as needed for wheezing or shortness of breath.  Dispense: 1 Inhaler; Refill: 0

## 2015-11-13 NOTE — Progress Notes (Signed)
Pre visit review using our clinic review tool, if applicable. No additional management support is needed unless otherwise documented below in the visit note. 

## 2015-11-13 NOTE — Patient Instructions (Signed)
Mucinex DM for cough and Tylenol as needed for discomfort. Continue use of flonase once daily during the time symptoms are present. Albuterol prescribed as requested for wheezing if you are using this more than twice/day, please contact clinic for further evaluation.  If symptoms do not improve in 3-4 days, worsen, or you develop a fever >101, please contact clinic for further evaluation.  Upper Respiratory Infection, Adult Most upper respiratory infections (URIs) are a viral infection of the air passages leading to the lungs. A URI affects the nose, throat, and upper air passages. The most common type of URI is nasopharyngitis and is typically referred to as "the common cold." URIs run their course and usually go away on their own. Most of the time, a URI does not require medical attention, but sometimes a bacterial infection in the upper airways can follow a viral infection. This is called a secondary infection. Sinus and middle ear infections are common types of secondary upper respiratory infections. Bacterial pneumonia can also complicate a URI. A URI can worsen asthma and chronic obstructive pulmonary disease (COPD). Sometimes, these complications can require emergency medical care and may be life threatening.  CAUSES Almost all URIs are caused by viruses. A virus is a type of germ and can spread from one person to another.  RISKS FACTORS You may be at risk for a URI if:   You smoke.   You have chronic heart or lung disease.  You have a weakened defense (immune) system.   You are very young or very old.   You have nasal allergies or asthma.  You work in crowded or poorly ventilated areas.  You work in health care facilities or schools. SIGNS AND SYMPTOMS  Symptoms typically develop 2-3 days after you come in contact with a cold virus. Most viral URIs last 7-10 days. However, viral URIs from the influenza virus (flu virus) can last 14-18 days and are typically more severe. Symptoms  may include:   Runny or stuffy (congested) nose.   Sneezing.   Cough.   Sore throat.   Headache.   Fatigue.   Fever.   Loss of appetite.   Pain in your forehead, behind your eyes, and over your cheekbones (sinus pain).  Muscle aches.  DIAGNOSIS  Your health care provider may diagnose a URI by:  Physical exam.  Tests to check that your symptoms are not due to another condition such as:  Strep throat.  Sinusitis.  Pneumonia.  Asthma. TREATMENT  A URI goes away on its own with time. It cannot be cured with medicines, but medicines may be prescribed or recommended to relieve symptoms. Medicines may help:  Reduce your fever.  Reduce your cough.  Relieve nasal congestion. HOME CARE INSTRUCTIONS   Take medicines only as directed by your health care provider.   Gargle warm saltwater or take cough drops to comfort your throat as directed by your health care provider.  Use a warm mist humidifier or inhale steam from a shower to increase air moisture. This may make it easier to breathe.  Drink enough fluid to keep your urine clear or pale yellow.   Eat soups and other clear broths and maintain good nutrition.   Rest as needed.   Return to work when your temperature has returned to normal or as your health care provider advises. You may need to stay home longer to avoid infecting others. You can also use a face mask and careful hand washing to prevent spread of the  virus.  Increase the usage of your inhaler if you have asthma.   Do not use any tobacco products, including cigarettes, chewing tobacco, or electronic cigarettes. If you need help quitting, ask your health care provider. PREVENTION  The best way to protect yourself from getting a cold is to practice good hygiene.   Avoid oral or hand contact with people with cold symptoms.   Wash your hands often if contact occurs.  There is no clear evidence that vitamin C, vitamin E, echinacea, or  exercise reduces the chance of developing a cold. However, it is always recommended to get plenty of rest, exercise, and practice good nutrition.  SEEK MEDICAL CARE IF:   You are getting worse rather than better.   Your symptoms are not controlled by medicine.   You have chills.  You have worsening shortness of breath.  You have brown or red mucus.  You have yellow or brown nasal discharge.  You have pain in your face, especially when you bend forward.  You have a fever.  You have swollen neck glands.  You have pain while swallowing.  You have Kroeger areas in the back of your throat. SEEK IMMEDIATE MEDICAL CARE IF:   You have severe or persistent:  Headache.  Ear pain.  Sinus pain.  Chest pain.  You have chronic lung disease and any of the following:  Wheezing.  Prolonged cough.  Coughing up blood.  A change in your usual mucus.  You have a stiff neck.  You have changes in your:  Vision.  Hearing.  Thinking.  Mood. MAKE SURE YOU:   Understand these instructions.  Will watch your condition.  Will get help right away if you are not doing well or get worse.   This information is not intended to replace advice given to you by your health care provider. Make sure you discuss any questions you have with your health care provider.   Document Released: 02/23/2001 Document Revised: 01/14/2015 Document Reviewed: 12/05/2013 Elsevier Interactive Patient Education [employer].

## 2015-11-30 ENCOUNTER — Emergency Department (HOSPITAL_COMMUNITY)
Admission: EM | Admit: 2015-11-30 | Discharge: 2015-11-30 | Disposition: A | Discharge status: Home / Self Care | Payer: Medicare Other | Attending: Emergency Medicine | Admitting: Emergency Medicine

## 2015-11-30 ENCOUNTER — Encounter (HOSPITAL_COMMUNITY): Payer: Self-pay | Admitting: Emergency Medicine

## 2015-11-30 ENCOUNTER — Emergency Department (HOSPITAL_COMMUNITY): Payer: Medicare Other

## 2015-11-30 DIAGNOSIS — J111 Influenza due to unidentified influenza virus with other respiratory manifestations: Secondary | ICD-10-CM | POA: Diagnosis not present

## 2015-11-30 DIAGNOSIS — Z8719 Personal history of other diseases of the digestive system: Secondary | ICD-10-CM | POA: Diagnosis not present

## 2015-11-30 DIAGNOSIS — R000 Tachycardia, unspecified: Secondary | ICD-10-CM | POA: Insufficient documentation

## 2015-11-30 DIAGNOSIS — R5383 Other fatigue: Secondary | ICD-10-CM | POA: Insufficient documentation

## 2015-11-30 DIAGNOSIS — R6889 Other general symptoms and signs: Secondary | ICD-10-CM

## 2015-11-30 DIAGNOSIS — R0981 Nasal congestion: Secondary | ICD-10-CM | POA: Diagnosis not present

## 2015-11-30 DIAGNOSIS — R0602 Shortness of breath: Secondary | ICD-10-CM | POA: Diagnosis not present

## 2015-11-30 DIAGNOSIS — E039 Hypothyroidism, unspecified: Secondary | ICD-10-CM | POA: Insufficient documentation

## 2015-11-30 DIAGNOSIS — I10 Essential (primary) hypertension: Secondary | ICD-10-CM | POA: Insufficient documentation

## 2015-11-30 DIAGNOSIS — Z79899 Other long term (current) drug therapy: Secondary | ICD-10-CM | POA: Diagnosis not present

## 2015-11-30 DIAGNOSIS — R509 Fever, unspecified: Secondary | ICD-10-CM

## 2015-11-30 DIAGNOSIS — E785 Hyperlipidemia, unspecified: Secondary | ICD-10-CM | POA: Insufficient documentation

## 2015-11-30 DIAGNOSIS — R05 Cough: Secondary | ICD-10-CM | POA: Diagnosis present

## 2015-11-30 LAB — COMPREHENSIVE METABOLIC PANEL
ALT: 18 U/L (ref 14–54)
ANION GAP: 10 (ref 5–15)
AST: 30 U/L (ref 15–41)
Albumin: 3.8 g/dL (ref 3.5–5.0)
Alkaline Phosphatase: 69 U/L (ref 38–126)
BUN: 15 mg/dL (ref 6–20)
CHLORIDE: 104 mmol/L (ref 101–111)
CO2: 24 mmol/L (ref 22–32)
CREATININE: 0.91 mg/dL (ref 0.44–1.00)
Calcium: 9 mg/dL (ref 8.9–10.3)
Glucose, Bld: 168 mg/dL — ABNORMAL HIGH (ref 65–99)
POTASSIUM: 3.7 mmol/L (ref 3.5–5.1)
SODIUM: 138 mmol/L (ref 135–145)
Total Bilirubin: 0.8 mg/dL (ref 0.3–1.2)
Total Protein: 6.8 g/dL (ref 6.5–8.1)

## 2015-11-30 LAB — CBC
HEMATOCRIT: 43.1 % (ref 36.0–46.0)
Hemoglobin: 14.7 g/dL (ref 12.0–15.0)
MCH: 28.6 pg (ref 26.0–34.0)
MCHC: 34.1 g/dL (ref 30.0–36.0)
MCV: 83.9 fL (ref 78.0–100.0)
PLATELETS: 194 10*3/uL (ref 150–400)
RBC: 5.14 MIL/uL — AB (ref 3.87–5.11)
RDW: 12.6 % (ref 11.5–15.5)
WBC: 8.9 10*3/uL (ref 4.0–10.5)

## 2015-11-30 LAB — INFLUENZA PANEL BY PCR (TYPE A & B)
H1N1FLUPCR: NOT DETECTED
Influenza A By PCR: POSITIVE — AB
Influenza B By PCR: NEGATIVE

## 2015-11-30 LAB — I-STAT CG4 LACTIC ACID, ED: LACTIC ACID, VENOUS: 1.7 mmol/L (ref 0.5–2.0)

## 2015-11-30 MED ORDER — BENZONATATE 100 MG PO CAPS: 100.0000 mg | ORAL_CAPSULE | Freq: Three times a day (TID) | ORAL | Status: DC | PRN

## 2015-11-30 MED ORDER — AZITHROMYCIN 250 MG PO TABS: ORAL_TABLET | ORAL | Status: DC

## 2015-11-30 MED ORDER — IBUPROFEN 400 MG PO TABS
600.0000 mg | ORAL_TABLET | Freq: Once | ORAL | Status: AC
  Administered 2015-11-30: 600 mg via ORAL
  Filled 2015-11-30: qty 1

## 2015-11-30 MED ORDER — SODIUM CHLORIDE 0.9 % IV BOLUS (SEPSIS): 500.0000 mL | Freq: Once | INTRAVENOUS | Status: DC

## 2015-11-30 MED ORDER — ACETAMINOPHEN 325 MG PO TABS
ORAL_TABLET | ORAL | Status: AC
  Filled 2015-11-30: qty 2

## 2015-11-30 MED ORDER — ACETAMINOPHEN 325 MG PO TABS
650.0000 mg | ORAL_TABLET | Freq: Once | ORAL | Status: AC | PRN
  Administered 2015-11-30: 650 mg via ORAL

## 2015-11-30 NOTE — Discharge Instructions (Signed)
Follow-up the flu test taken in the ER to decide on antibiotics. Take Tylenol and Motrin for fever and pain. Stay well-hydrated.  If you were given medicines take as directed.  If you are on coumadin or contraceptives realize their levels and effectiveness is altered by many different medicines.  If you have any reaction (rash, tongues swelling, other) to the medicines stop taking and see a physician.    If your blood pressure was elevated in the ER make sure you follow up for management with a primary doctor or return for chest pain, shortness of breath or stroke symptoms.  Please follow up as directed and return to the ER or see a physician for new or worsening symptoms.  Thank you. Filed Vitals:   11/30/15 1353 11/30/15 1629 11/30/15 1800  BP: 160/51 154/82 162/69  Pulse: 112 116 108  Temp: 98.8 F (37.1 C) 101.4 F (38.6 C) 102 F (38.9 C)  TempSrc: Oral Oral Oral  Resp: 18 18 19   Height: 5\' 4"  (1.626 m)    Weight: 170 lb 5 oz (77.253 kg)    SpO2: 97% 100% 100%    .

## 2015-11-30 NOTE — ED Notes (Signed)
Pt c/o cough x 2 weeks. Pt was seen at her doctors office for same and was told she had a general cold and allergies. Pt reports that now she tastes blood when she coughs.

## 2015-11-30 NOTE — ED Provider Notes (Signed)
CSN: [id number]     Arrival date & time 11/30/15  1320 History   First MD Initiated Contact with Patient 11/30/15 1738     Chief Complaint  Patient presents with  . Cough     (Consider location/radiation/quality/duration/timing/severity/associated sxs/prior Treatment) HPI Comments: 75 year old female presents with worsening productive cough, fevers symptoms most severe for the past 2 days. Patient's husband has similar symptoms. Patient still tolerating liquids. Mild fatigue. No recent travel, no TB exposure or history. Mild blood-tinged cough. Mild chest discomfort only with coughing.  Patient is a 75 y.o. female presenting with cough. The history is provided by the patient.  Cough Associated symptoms: fever   Associated symptoms: no chills, no headaches, no rash and no shortness of breath     Past Medical History  Diagnosis Date  . Hypertension   . Hyperlipidemia   . Hypothyroidism   . History of stomach ulcers 1980   Past Surgical History  Procedure Laterality Date  . Thyroidectomy  2007  . Appendectomy  1954  . Tonsillectomy  1949  . Laparoscopy  1982  . Colonoscopy     No family history on file. Social History  Substance Use Topics  . Smoking status: Never Smoker   . Smokeless tobacco: Never Used  . Alcohol Use: 0.6 oz/week    1 Glasses of wine per week   OB History    No data available     Review of Systems  Constitutional: Positive for fever and appetite change. Negative for chills.  HENT: Positive for congestion.   Eyes: Negative for visual disturbance.  Respiratory: Positive for cough. Negative for shortness of breath.   Gastrointestinal: Negative for vomiting and abdominal pain.  Genitourinary: Negative for dysuria and flank pain.  Musculoskeletal: Negative for back pain, neck pain and neck stiffness.  Skin: Negative for rash.  Neurological: Negative for light-headedness and headaches.      Allergies  Fluzone and Tetracyclines & related  Home  Medications   Prior to Admission medications   Medication Sig Start Date End Date Taking? Authorizing Provider  albuterol (PROVENTIL HFA;VENTOLIN HFA) 108 (90 Base) MCG/ACT inhaler Inhale 2 puffs into the lungs every 6 (six) hours as needed for wheezing or shortness of breath. 11/13/15   Delano Metz, FNP  amLODipine (NORVASC) 5 MG tablet TAKE 1 TABLET (5 MG TOTAL) BY MOUTH DAILY. 05/09/15   Marletta Lor, MD  azithromycin (ZITHROMAX Z-PAK) 250 MG tablet 2 po day one, then 1 daily x 4 days 12/01/15   Elnora Morrison, MD  benzonatate (TESSALON PERLES) 100 MG capsule Take 1 capsule (100 mg total) by mouth 3 (three) times daily as needed for cough. 11/30/15   Elnora Morrison, MD  cholecalciferol (VITAMIN D) 1000 UNITS tablet Take 2,000 Units by mouth daily.    Historical Provider, MD  hydrochlorothiazide (HYDRODIURIL) 25 MG tablet TAKE ONE TABLET BY MOUTH DAILY 04/03/15   Marletta Lor, MD  levothyroxine (SYNTHROID, LEVOTHROID) 125 MCG tablet TAKE 1 TABLET BY MOUTH DAILY 09/23/15   Marletta Lor, MD  pravastatin (PRAVACHOL) 20 MG tablet TAKE ONE TABLET BY MOUTH DAILY 08/11/15   Marletta Lor, MD  quinapril (ACCUPRIL) 40 MG tablet TAKE 1 TABLET (40 MG TOTAL) BY MOUTH AT BEDTIME. 09/06/15   Marletta Lor, MD  vitamin C (ASCORBIC ACID) 500 MG tablet Take 500 mg by mouth daily.    Historical Provider, MD  zolpidem (AMBIEN) 5 MG tablet TAKE 1 TABLET BY MOUTH AT BEDTIME AS NEEDED FOR SLEEP  01/31/15   Marletta Lor, MD   BP 162/69 mmHg  Pulse 108  Temp(Src) 102 F (38.9 C) (Oral)  Resp 19  Ht 5\' 4"  (1.626 m)  Wt 170 lb 5 oz (77.253 kg)  BMI 29.22 kg/m2  SpO2 100% Physical Exam  Constitutional: She is oriented to person, place, and time. She appears well-developed and well-nourished.  HENT:  Head: Normocephalic and atraumatic.  Mild dry mucous membranes  Eyes: Right eye exhibits no discharge. Left eye exhibits no discharge.  Neck: Normal range of motion. Neck supple.  No tracheal deviation present.  Cardiovascular: Regular rhythm.  Tachycardia present.   Pulmonary/Chest: Effort normal and breath sounds normal.  Abdominal: Soft. She exhibits no distension. There is no tenderness. There is no guarding.  Musculoskeletal: She exhibits no edema.  Neurological: She is alert and oriented to person, place, and time.  Skin: Skin is warm. No rash noted.  Psychiatric: She has a normal mood and affect.  Nursing note and vitals reviewed.   ED Course  Procedures (including critical care time) Labs Review Labs Reviewed  COMPREHENSIVE METABOLIC PANEL - Abnormal; Notable for the following:    Glucose, Bld 168 (*)    All other components within normal limits  CBC - Abnormal; Notable for the following:    RBC 5.14 (*)    All other components within normal limits  URINE CULTURE  URINALYSIS, ROUTINE W REFLEX MICROSCOPIC (NOT AT [facility])  INFLUENZA PANEL BY PCR (TYPE A & B, H1N1)  I-STAT CG4 LACTIC ACID, ED    Imaging Review Dg Chest 2 View  11/30/2015  CLINICAL DATA:  Shortness of breath with 1 day of hemoptysis EXAM: CHEST  2 VIEW COMPARISON:  January 01, 2013 FINDINGS: There is mild atelectasis in the inferior lingula. The lungs elsewhere are clear. Heart size and pulmonary vascularity are normal. No adenopathy. There are surgical clips in the upper midline thoracic region. There is anterior wedging of the T11 vertebral body. IMPRESSION: Atelectasis inferior lingula.  No edema or consolidation. Electronically Signed   By: Lowella Grip III M.D.   On: 11/30/2015 15:23   I have personally reviewed and evaluated these images and lab results as part of my medical decision-making.   EKG Interpretation None      MDM   Final diagnoses:  Fever, unspecified fever cause  Flu-like symptoms   Patient presents with flulike symptoms per chest x-ray reviewed no focal infiltrate. Antipyretics given in the ER. Patient well-appearing overall, oral fluids given. Discussed  plan for close outpatient follow-up with primary doctor, influenza test will be sent itching follow-up results on Monday to decide on antibiotics. Discussed holding antibiotics until that result.  Results and differential diagnosis were discussed with the patient/parent/guardian. Xrays were independently reviewed by myself.  Close follow up outpatient was discussed, comfortable with the plan.   Medications  acetaminophen (TYLENOL) 325 MG tablet (not administered)  sodium chloride 0.9 % bolus 500 mL (not administered)  ibuprofen (ADVIL,MOTRIN) tablet 600 mg (not administered)  acetaminophen (TYLENOL) tablet 650 mg (650 mg Oral Given 11/30/15 1643)    Filed Vitals:   11/30/15 1353 11/30/15 1629 11/30/15 1800  BP: 160/51 154/82 162/69  Pulse: 112 116 108  Temp: 98.8 F (37.1 C) 101.4 F (38.6 C) 102 F (38.9 C)  TempSrc: Oral Oral Oral  Resp: 18 18 19   Height: 5\' 4"  (1.626 m)    Weight: 170 lb 5 oz (77.253 kg)    SpO2: 97% 100% 100%  Final diagnoses:  Fever, unspecified fever cause  Flu-like symptoms       Elnora Morrison, MD 11/30/15 1825

## 2015-12-01 ENCOUNTER — Encounter: Payer: Self-pay | Admitting: Internal Medicine

## 2015-12-04 ENCOUNTER — Ambulatory Visit (INDEPENDENT_AMBULATORY_CARE_PROVIDER_SITE_OTHER): Payer: Medicare Other | Admitting: Adult Health

## 2015-12-04 ENCOUNTER — Encounter: Payer: Self-pay | Admitting: Adult Health

## 2015-12-04 VITALS — BP 152/74 | HR 78 | Temp 98.9°F | Ht 64.0 in | Wt 170.0 lb

## 2015-12-04 DIAGNOSIS — J209 Acute bronchitis, unspecified: Secondary | ICD-10-CM

## 2015-12-04 MED ORDER — IPRATROPIUM-ALBUTEROL 0.5-2.5 (3) MG/3ML IN SOLN
3.0000 mL | Freq: Once | RESPIRATORY_TRACT | Status: AC
  Administered 2015-12-04: 3 mL via RESPIRATORY_TRACT

## 2015-12-04 MED ORDER — PREDNISONE 10 MG PO TABS: ORAL_TABLET | ORAL | Status: DC

## 2015-12-04 MED ORDER — BENZONATATE 200 MG PO CAPS: 200.0000 mg | ORAL_CAPSULE | Freq: Two times a day (BID) | ORAL | Status: DC | PRN

## 2015-12-04 NOTE — Progress Notes (Signed)
Subjective:    Patient ID: Teresa Rangel, female    DOB: August 04, 1941, 75 y.o.   MRN: [id number]  HPI  This is a 75 year old female who presents to the office today for hospital follow-up, after being seen in the emergency room on 11/30/2015. She presented to the emergency room with worsening productive cough, fevers, and flulike symptoms for 2 days prior. She also reported mild blood-tinged sputum with cough and mild chest discomfort that was only with coughing  Chest x-ray in the ER showed no focal infiltrate. Se did test positive for influenza A. Lactic acid , CBC and BMP were normal. She was prescribed azithromycin and tessalon pearls.   Today she reports that she continues to have a fever but it is slowly coming down, today when she woke up it was 99. Today in the office her temp is 98.9.   She continues to have a dry cough, and is feeling "run down".   Review of Systems  Constitutional: Positive for fever, chills, diaphoresis, activity change, appetite change and fatigue.  HENT: Positive for rhinorrhea. Negative for sore throat.   Respiratory: Positive for cough, shortness of breath and wheezing.   Cardiovascular: Negative.   Neurological: Positive for weakness and headaches.   Past Medical History  Diagnosis Date  . Hypertension   . Hyperlipidemia   . Hypothyroidism   . History of stomach ulcers 1980    Social History   Social History  . Marital Status: Married    Spouse Name: N/A  . Number of Children: N/A  . Years of Education: N/A   Occupational History  . Not on file.   Social History Main Topics  . Smoking status: Never Smoker   . Smokeless tobacco: Never Used  . Alcohol Use: 0.6 oz/week    1 Glasses of wine per week  . Drug Use: No  . Sexual Activity:    Partners: Male   Other Topics Concern  . Not on file   Social History Narrative    Past Surgical History  Procedure Laterality Date  . Thyroidectomy  2007  . Appendectomy  1954  . Tonsillectomy   1949  . Laparoscopy  1982  . Colonoscopy      No family history on file.  Allergies  Allergen Reactions  . Fluzone [Flu Virus Vaccine] Other (See Comments)    Lethargic, muscle aches, flat on back for 3 days  . Tetracyclines & Related     Stomach cramps    Current Outpatient Prescriptions on File Prior to Visit  Medication Sig Dispense Refill  . albuterol (PROVENTIL HFA;VENTOLIN HFA) 108 (90 Base) MCG/ACT inhaler Inhale 2 puffs into the lungs every 6 (six) hours as needed for wheezing or shortness of breath. 1 Inhaler 0  . amLODipine (NORVASC) 5 MG tablet TAKE 1 TABLET (5 MG TOTAL) BY MOUTH DAILY. 90 tablet 1  . azithromycin (ZITHROMAX Z-PAK) 250 MG tablet 2 po day one, then 1 daily x 4 days 5 tablet 0  . benzonatate (TESSALON PERLES) 100 MG capsule Take 1 capsule (100 mg total) by mouth 3 (three) times daily as needed for cough. 10 capsule 0  . cholecalciferol (VITAMIN D) 1000 UNITS tablet Take 2,000 Units by mouth daily.    . hydrochlorothiazide (HYDRODIURIL) 25 MG tablet TAKE ONE TABLET BY MOUTH DAILY 90 tablet 0  . levothyroxine (SYNTHROID, LEVOTHROID) 125 MCG tablet TAKE 1 TABLET BY MOUTH DAILY 90 tablet 0  . pravastatin (PRAVACHOL) 20 MG tablet TAKE ONE TABLET BY  MOUTH DAILY 90 tablet 3  . quinapril (ACCUPRIL) 40 MG tablet TAKE 1 TABLET (40 MG TOTAL) BY MOUTH AT BEDTIME. 90 tablet 1  . vitamin C (ASCORBIC ACID) 500 MG tablet Take 500 mg by mouth daily.    Marland Kitchen zolpidem (AMBIEN) 5 MG tablet TAKE 1 TABLET BY MOUTH AT BEDTIME AS NEEDED FOR SLEEP 30 tablet 2   No current facility-administered medications on file prior to visit.    BP 152/74 mmHg  Pulse 78  Temp(Src) 98.9 F (37.2 C) (Oral)  Ht 5\' 4"  (1.626 m)  Wt 170 lb (77.111 kg)  BMI 29.17 kg/m2  SpO2 95%       Objective:   Physical Exam  Constitutional: She is oriented to person, place, and time. She appears well-developed and well-nourished. No distress.  HENT:  Head: Normocephalic and atraumatic.  Right Ear:  External ear normal.  Left Ear: External ear normal.  Nose: Nose normal.  Mouth/Throat: Oropharynx is clear and moist. No oropharyngeal exudate.  Eyes: Conjunctivae and EOM are normal. Pupils are equal, round, and reactive to light. Right eye exhibits no discharge. Left eye exhibits no discharge.  Cardiovascular: Normal rate, regular rhythm, normal heart sounds and intact distal pulses.  Exam reveals no gallop and no friction rub.   No murmur heard. Pulmonary/Chest: Effort normal. No accessory muscle usage. No respiratory distress. She has wheezes in the right upper field, the right middle field, the right lower field, the left upper field, the left middle field and the left lower field. She has no rales. She exhibits no tenderness.  Musculoskeletal: Normal range of motion. She exhibits no edema.  Neurological: She is alert and oriented to person, place, and time.  Skin: Skin is warm and dry. No rash noted. She is not diaphoretic. No erythema. No pallor.  Psychiatric: She has a normal mood and affect. Her behavior is normal. Thought content normal.  Nursing note and vitals reviewed.      Assessment & Plan:  1. Acute bronchitis, unspecified organism  - benzonatate (TESSALON) 200 MG capsule; Take 1 capsule (200 mg total) by mouth 2 (two) times daily as needed for cough.  Dispense: 20 capsule; Refill: 1 - predniSONE (DELTASONE) 10 MG tablet; 40 mg x 3 days, 20 mg x 3 days, 10mg  x 3days  Dispense: 21 tablet; Refill: 0 - ipratropium-albuterol (DUONEB) 0.5-2.5 (3) MG/3ML nebulizer solution 3 mL; Take 3 mLs by nebulization once. - After nebulizer patient endorsed being able to breath better. She continued to have wheezing in full fields, no rhonchi but was moving oxygen much easier.  - Mucinex cough or Delsym - Use inhaler as needed - Follow up if no improvement.

## 2015-12-04 NOTE — Patient Instructions (Addendum)
It was great meeting you today!  I think the flu has exacerbated an asthma or bronchitis flare.   I have sent in a prescription for prednisone, take as directed  40 mg x 3 days 20 mgx 3 days 10 mg x 3 days.   I have also sent in a new prescription for tessalon pearls.  You can also use over the counter Mucinex cough or Delsym. If you take Mucinex, take it with a lot of water  Rest and stay well hydrated

## 2015-12-10 ENCOUNTER — Encounter: Payer: Self-pay | Admitting: Internal Medicine

## 2015-12-10 ENCOUNTER — Telehealth: Payer: Self-pay | Admitting: Family Medicine

## 2015-12-10 ENCOUNTER — Ambulatory Visit (INDEPENDENT_AMBULATORY_CARE_PROVIDER_SITE_OTHER): Payer: Medicare Other | Admitting: Internal Medicine

## 2015-12-10 VITALS — BP 180/78 | HR 91 | Temp 98.6°F | Ht 64.0 in | Wt 170.0 lb

## 2015-12-10 DIAGNOSIS — IMO0001 Reserved for inherently not codable concepts without codable children: Secondary | ICD-10-CM

## 2015-12-10 DIAGNOSIS — Z8709 Personal history of other diseases of the respiratory system: Secondary | ICD-10-CM

## 2015-12-10 DIAGNOSIS — Z8619 Personal history of other infectious and parasitic diseases: Secondary | ICD-10-CM | POA: Diagnosis not present

## 2015-12-10 DIAGNOSIS — R030 Elevated blood-pressure reading, without diagnosis of hypertension: Secondary | ICD-10-CM | POA: Diagnosis not present

## 2015-12-10 DIAGNOSIS — J40 Bronchitis, not specified as acute or chronic: Secondary | ICD-10-CM

## 2015-12-10 MED ORDER — IPRATROPIUM-ALBUTEROL 0.5-2.5 (3) MG/3ML IN SOLN
3.0000 mL | Freq: Once | RESPIRATORY_TRACT | Status: AC
  Administered 2015-12-10: 3 mL via RESPIRATORY_TRACT

## 2015-12-10 NOTE — Progress Notes (Signed)
Chief Complaint  Patient presents with  . Breathing Problem    HPI: Teresa Rangel 75 y.o.  Walked in to clinic today and gvien later appt Dx with flu on first day when in ed w husband for cough   Had  103  Temp   Flu test pos after she left  Not given tamiflu but fwas given antibiotic and pred last week  For  Bronchitis   On pred jitters  .  And antibiotic   No fever after 5 days  But still wheezing and neb helped last week.   Sob at times   No dx asthma but some airway issues with mold in past. 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  Past Medical History  Diagnosis Date  . Hypertension   . Hyperlipidemia   . Hypothyroidism   . History of stomach ulcers 1980    No family history on file.  Social History   Social History  . Marital Status: Married    Spouse Name: N/A  . Number of Children: N/A  . Years of Education: N/A   Social History Main Topics  . Smoking status: Never Smoker   . Smokeless tobacco: Never Used  . Alcohol Use: 0.6 oz/week    1 Glasses of wine per week  . Drug Use: No  . Sexual Activity:    Partners: Male   Other Topics Concern  . None   Social History Narrative    Outpatient Prescriptions Prior to Visit  Medication Sig Dispense Refill  . albuterol (PROVENTIL HFA;VENTOLIN HFA) 108 (90 Base) MCG/ACT inhaler Inhale 2 puffs into the lungs every 6 (six) hours as needed for wheezing or shortness of breath. 1 Inhaler 0  . amLODipine (NORVASC) 5 MG tablet TAKE 1 TABLET (5 MG TOTAL) BY MOUTH DAILY. 90 tablet 1  . azithromycin (ZITHROMAX Z-PAK) 250 MG tablet 2 po day one, then 1 daily x 4 days 5 tablet 0  . benzonatate (TESSALON PERLES) 100 MG capsule Take 1 capsule (100 mg total) by mouth 3 (three) times daily as needed for cough. 10 capsule 0  . benzonatate (TESSALON) 200 MG capsule Take 1 capsule (200 mg total) by mouth 2 (two) times daily  as needed for cough. 20 capsule 1  . cholecalciferol (VITAMIN D) 1000 UNITS tablet Take 2,000 Units by mouth daily.    . hydrochlorothiazide (HYDRODIURIL) 25 MG tablet TAKE ONE TABLET BY MOUTH DAILY 90 tablet 0  . levothyroxine (SYNTHROID, LEVOTHROID) 125 MCG tablet TAKE 1 TABLET BY MOUTH DAILY 90 tablet 0  . pravastatin (PRAVACHOL) 20 MG tablet TAKE ONE TABLET BY MOUTH DAILY 90 tablet 3  . predniSONE (DELTASONE) 10 MG tablet 40 mg x 3 days, 20 mg x 3 days, 10mg  x 3days 21 tablet 0  . quinapril (ACCUPRIL) 40 MG tablet TAKE 1 TABLET (40 MG TOTAL) BY MOUTH AT BEDTIME. 90 tablet 1  . vitamin C (ASCORBIC ACID) 500 MG tablet Take 500 mg by mouth daily.    Marland Kitchen zolpidem (AMBIEN) 5 MG tablet TAKE 1 TABLET BY MOUTH AT BEDTIME AS NEEDED FOR SLEEP 30 tablet 2   No facility-administered medications prior to visit.     EXAM:  BP 180/78 mmHg  Pulse 91  Temp(Src) 98.6 F (37 C) (Oral)  Ht 5\' 4"  (1.626 m)  Wt 170 lb (77.111 kg)  BMI  29.17 kg/m2  Body mass index is 29.17 kg/(m^2).  GENERAL: vitals reviewed and listed above, alert, oriented, appears well hydrated and in no acute distress looks tired  Coughing wheezy nl color  HEENT: atraumatic, conjunctiva  clear, no obvious abnormalities on inspection of external nose and ears tms nadOP : no lesion edema or exudate  NECK: no obvious masses on inspection palpation  LUNGS:  Exp wheeze on end exp diffuse    auscultation bilaterally, no , rales or rhonchi,   almoset cleared after neb with  Duoneb... CV: HRRR, no clubbing cyanosis or  peripheral edema nl cap refill  MS: moves all extremities nl gait  No edema PSYCH: pleasant and cooperative, no obvious depression or anxiety BP Readings from Last 3 Encounters:  12/10/15 180/78  12/04/15 152/74  11/30/15 162/69    ASSESSMENT AND PLAN:  Discussed the following assessment and plan:  Wheezy bronchitis post infectious - great response to neb sample of advair 250 given 1 puff per day after pred finish  and fu pcp if onoging wheezing - Plan: ipratropium-albuterol (DUONEB) 0.5-2.5 (3) MG/3ML nebulizer solution 3 mL  Hx of influenza  3 19 2017  - recovering from   Elevated BP - pt says from illness  will check to make sure ok otherwise  -Patient advised to return or notify health care team  if symptoms worsen ,persist or new concerns arise.  Patient Instructions  This acts like .  Wheezy  bronchitis ... From the flu .  contniue albuterol every 6 hours as needed .   Fu with dr  Raliegh Ip  Next week if not continuing   To improve .    Standley Brooking. Noha Karasik M.D.

## 2015-12-10 NOTE — Telephone Encounter (Signed)
Pt walked into the office today complaining that she still does not feel well and only has 2 days of medication (prednisone) left.  Wanted to be seen.  She seen Dorothyann Peng on 12/04/15 and diagnosed with bronchitis. Checked vitals while in the office.  BP: 152/72 Temp: 98.7 O2: 97 HR: 77  Pt released to come back for OV with WP today at 3 PM.  WP notified.  Pt did show for OV.

## 2015-12-10 NOTE — Patient Instructions (Signed)
This acts like .  Wheezy  bronchitis ... From the flu .  contniue albuterol every 6 hours as needed .   Fu with dr  Raliegh Ip  Next week if not continuing   To improve .

## 2016-01-26 ENCOUNTER — Other Ambulatory Visit: Payer: Self-pay

## 2016-01-26 MED ORDER — AMLODIPINE BESYLATE 5 MG PO TABS: ORAL_TABLET | ORAL | Status: DC

## 2016-01-26 MED ORDER — LEVOTHYROXINE SODIUM 125 MCG PO TABS: 125.0000 ug | ORAL_TABLET | Freq: Every day | ORAL | Status: DC

## 2016-01-29 ENCOUNTER — Other Ambulatory Visit: Payer: Self-pay | Admitting: General Practice

## 2016-01-29 MED ORDER — LEVOTHYROXINE SODIUM 125 MCG PO TABS: 125.0000 ug | ORAL_TABLET | Freq: Every day | ORAL | Status: DC

## 2016-03-15 ENCOUNTER — Other Ambulatory Visit: Payer: Self-pay | Admitting: Internal Medicine

## 2016-05-26 ENCOUNTER — Other Ambulatory Visit: Payer: Self-pay | Admitting: Internal Medicine

## 2016-06-12 ENCOUNTER — Other Ambulatory Visit: Payer: Self-pay | Admitting: Family Medicine

## 2016-06-24 ENCOUNTER — Other Ambulatory Visit: Payer: Self-pay | Admitting: Podiatry

## 2016-07-02 ENCOUNTER — Other Ambulatory Visit: Payer: Self-pay | Admitting: Internal Medicine

## 2016-07-12 ENCOUNTER — Ambulatory Visit (INDEPENDENT_AMBULATORY_CARE_PROVIDER_SITE_OTHER): Payer: Medicare Other | Admitting: Internal Medicine

## 2016-07-12 ENCOUNTER — Encounter: Payer: Self-pay | Admitting: Internal Medicine

## 2016-07-12 ENCOUNTER — Telehealth: Payer: Self-pay

## 2016-07-12 VITALS — BP 144/80 | HR 92 | Ht 64.0 in | Wt 173.5 lb

## 2016-07-12 DIAGNOSIS — K648 Other hemorrhoids: Secondary | ICD-10-CM

## 2016-07-12 DIAGNOSIS — K5909 Other constipation: Secondary | ICD-10-CM

## 2016-07-12 DIAGNOSIS — K644 Residual hemorrhoidal skin tags: Secondary | ICD-10-CM

## 2016-07-12 NOTE — Progress Notes (Signed)
Teresa Rangel 75 y.o. 11-08-1940 [id number]  Assessment & Plan:   Encounter Diagnoses  Name Primary?  . Internal and external prolapsed hemorrhoids Yes  . Anal skin tags   . Chronic constipation    We'll add Benefiber 2 tablespoons daily help with bowel habits and hopefully improve hemorrhoid situation.  I think with this situation she could possibly benefit from surgery, Teresa Rangel procedure perhaps, hemorrhoidal banding which she came here about is likely to help but I think it would require repeat banding's, and I think she should hear about all of her options. I'm referring her to Dr. Leighton Rangel, who can provide full spectrum of hemorrhoid treatment from banding or sclerosis and also surgery depending upon recommendations and patient wishes.  I appreciate the opportunity to care for this patient. CC: Teresa Cowden, MD   Subjective:   Chief Complaint: hemorrhoids, rectal irritation  HPI  The patient is a very nice elderly Teresa Rangel woman with prior negative colonoscopy except that she has chronic hemorrhoid issues. Colonoscopy was in June of 2014 and that is the last time I have seen her. She describes significant difficulty with fecal soiling, cleansing issues, straining to stool and intermittent mild rectal bleeding and anal irritation. She has to wipe multiple times and's up spending a long time in the bathroom. She will use an intermittent Teresa Rangel laxative tablet. She is not on any fiber supplementation she does eat vegetables and some fresh fruit. She has prolapse symptoms as well. She is using over-the-counter treatments such as Preparation H. She is frustrated due to the chronic symptoms and would like some relief.  Allergies  Allergen Reactions  . Fluzone [Flu Virus Vaccine] Other (See Comments)    Lethargic, muscle aches, flat on back for 3 days  . Tetracyclines & Related     Stomach cramps   Outpatient Medications Prior to Visit  Medication Sig Dispense Refill   . amLODipine (NORVASC) 5 MG tablet TAKE 1 TABLET (5 MG TOTAL) BY MOUTH DAILY. 90 tablet 2  . cholecalciferol (VITAMIN D) 1000 UNITS tablet Take 2,000 Units by mouth daily.    . hydrochlorothiazide (HYDRODIURIL) 25 MG tablet TAKE ONE TABLET BY MOUTH DAILY 90 tablet 0  . levothyroxine (SYNTHROID, LEVOTHROID) 125 MCG tablet TAKE 1 TABLET BY MOUTH ONCE A DAY 90 tablet 1  . pravastatin (PRAVACHOL) 20 MG tablet TAKE ONE TABLET BY MOUTH DAILY 90 tablet 3  . quinapril (ACCUPRIL) 40 MG tablet TAKE 1 TABLET (40 MG TOTAL) BY MOUTH AT BEDTIME. 90 tablet 1  . vitamin C (ASCORBIC ACID) 500 MG tablet Take 500 mg by mouth daily.    Marland Kitchen albuterol (PROVENTIL HFA;VENTOLIN HFA) 108 (90 Base) MCG/ACT inhaler Inhale 2 puffs into the lungs every 6 (six) hours as needed for wheezing or shortness of breath. (Patient not taking: Reported on 07/12/2016) 1 Inhaler 0  . azithromycin (ZITHROMAX Z-PAK) 250 MG tablet 2 po day one, then 1 daily x 4 days 5 tablet 0  . benzonatate (TESSALON PERLES) 100 MG capsule Take 1 capsule (100 mg total) by mouth 3 (three) times daily as needed for cough. 10 capsule 0  . benzonatate (TESSALON) 200 MG capsule Take 1 capsule (200 mg total) by mouth 2 (two) times daily as needed for cough. 20 capsule 1  . predniSONE (DELTASONE) 10 MG tablet 40 mg x 3 days, 20 mg x 3 days, 10mg  x 3days 21 tablet 0  . zolpidem (AMBIEN) 5 MG tablet TAKE 1 TABLET BY MOUTH AT BEDTIME AS  NEEDED FOR SLEEP 30 tablet 2   No facility-administered medications prior to visit.    Past Medical History:  Diagnosis Date  . History of stomach ulcers 1980  . Hyperlipidemia   . Hypertension   . Hypothyroidism    Past Surgical History:  Procedure Laterality Date  . APPENDECTOMY  1954  . COLONOSCOPY    . LAPAROSCOPY  1982  . THYROIDECTOMY  2007  . TONSILLECTOMY  1949   Social History   Social History  . Marital status: Married    Spouse name: N/A  . Number of children: N/A  . Years of education: N/A   Social  History Main Topics  . Smoking status: Never Smoker  . Smokeless tobacco: Never Used  . Alcohol use 0.6 oz/week    1 Glasses of wine per week  . Drug use: No  . Sexual activity: Yes    Partners: Male   Family history is noncontributory Review of Systems otherwise well without breathing difficulty, no complaints of chest pain had bronchitis last spring I was her last visit to a physician in the chart.  Objective:   Physical Exam BP (!) 144/80 (BP Location: Left Arm, Patient Position: Sitting, Cuff Size: Normal)   Pulse 92   Ht 5\' 4"  (1.626 m) Comment: height measured without shoes  Wt 173 lb 8 oz (78.7 kg)   BMI 29.78 kg/m  She is alert and oriented 3 Appropriate mood and affect Eyes are anicteric  Teresa Rangel CMA present  Inspection shows fleshy tags on all 3 positions, there is a grade 3 prolapsed right anterior hemorrhoid. Digital exam reduces this hemorrhoid, it is nontender, there is no mass there is a small rectocele.  Anoscopic examination is also performed, and it demonstrates grade 2 right posterior and left lateral internal/external hemorrhoid complexes along with the grade 3 right anterior internal and external complex.

## 2016-07-12 NOTE — Patient Instructions (Signed)
[facility] will contact you with an appointment date and time to see Dr. Marcello Moores.  Make certain to bring a list of current medications, including any over the counter medications or vitamins. Also bring your co-pay if you have one as well as your insurance cards. [facility] is located at [address], [address]. Should you need to reschedule your appointment, please contact them at [phone].  Please start over the counter Benefiber 2 tablespoons daily.

## 2016-07-12 NOTE — Telephone Encounter (Signed)
Left a message for patient to return my call. 

## 2016-07-13 NOTE — Telephone Encounter (Signed)
Left a message for patient to return my call. 

## 2016-07-14 ENCOUNTER — Ambulatory Visit (INDEPENDENT_AMBULATORY_CARE_PROVIDER_SITE_OTHER): Payer: Medicare Other | Admitting: Adult Health

## 2016-07-14 ENCOUNTER — Encounter: Payer: Self-pay | Admitting: Adult Health

## 2016-07-14 ENCOUNTER — Other Ambulatory Visit: Payer: Self-pay

## 2016-07-14 VITALS — BP 158/80 | Temp 98.1°F | Ht 64.0 in | Wt 172.5 lb

## 2016-07-14 DIAGNOSIS — J209 Acute bronchitis, unspecified: Secondary | ICD-10-CM

## 2016-07-14 MED ORDER — ALBUTEROL SULFATE HFA 108 (90 BASE) MCG/ACT IN AERS: 2.0000 | INHALATION_SPRAY | Freq: Four times a day (QID) | RESPIRATORY_TRACT | 3 refills | Status: DC | PRN

## 2016-07-14 MED ORDER — AZITHROMYCIN 250 MG PO TABS: ORAL_TABLET | ORAL | 0 refills | Status: DC

## 2016-07-14 MED ORDER — IPRATROPIUM-ALBUTEROL 0.5-2.5 (3) MG/3ML IN SOLN: 3.0000 mL | Freq: Four times a day (QID) | RESPIRATORY_TRACT | Status: DC

## 2016-07-14 MED ORDER — ALBUTEROL SULFATE HFA 108 (90 BASE) MCG/ACT IN AERS: 2.0000 | INHALATION_SPRAY | Freq: Four times a day (QID) | RESPIRATORY_TRACT | 0 refills | Status: DC | PRN

## 2016-07-14 NOTE — Telephone Encounter (Signed)
Patient returned my call and informed patient of appt with Dr. Marcello Moores at [facility] on 08/03/16 at 11:20am. Patient verbalized understanding.

## 2016-07-14 NOTE — Telephone Encounter (Signed)
Left a message for patient to return my call. 

## 2016-07-14 NOTE — Progress Notes (Signed)
Subjective:    Patient ID: Teresa Rangel, female    DOB: [birth year], 75 y.o.   MRN: [id number]  URI   This is a chronic problem. The current episode started in the past 7 days. There has been no fever. Associated symptoms include congestion, coughing, headaches, sinus pain (left maxillary ) and wheezing. Pertinent negatives include no abdominal pain, diarrhea, ear pain, nausea, plugged ear sensation or rhinorrhea. She has tried decongestant and sleep for the symptoms. The treatment provided mild relief.    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 Negative for appetite change and fever.  HENT: Positive for congestion. Negative for ear pain and rhinorrhea.   Respiratory: Positive for cough, chest tightness, shortness of breath and wheezing.   Cardiovascular: Negative.   Gastrointestinal: Negative for abdominal pain, diarrhea and nausea.  Neurological: Positive for headaches.   Past Medical History:  Diagnosis Date  . History of stomach ulcers 1980  . Hyperlipidemia   . Hypertension   . Hypothyroidism     Social History   Social History  . Marital status: Married    Spouse name: N/A  . Number of children: N/A  . Years of education: N/A   Occupational History  . Not on file.   Social History Main Topics  . Smoking status: Never Smoker  . Smokeless tobacco: Never Used  . Alcohol use 0.6 oz/week    1 Glasses of wine per week  . Drug use: No  . Sexual activity: Yes    Partners: Male   Other Topics Concern  . Not on file   Social History Narrative  . No narrative on file    Past Surgical History:  Procedure Laterality Date  . APPENDECTOMY  1954  . COLONOSCOPY    . LAPAROSCOPY  1982  . THYROIDECTOMY  2007  . TONSILLECTOMY  1949    No family history on file.  Allergies  Allergen  Reactions  . Fluzone [Flu Virus Vaccine] Other (See Comments)    Lethargic, muscle aches, flat on back for 3 days  . Tetracyclines & Related     Stomach cramps    Current Outpatient Prescriptions on File Prior to Visit  Medication Sig Dispense Refill  . albuterol (PROVENTIL HFA;VENTOLIN HFA) 108 (90 Base) MCG/ACT inhaler Inhale 2 puffs into the lungs every 6 (six) hours as needed for wheezing or shortness of breath. (Patient not taking: Reported on 07/12/2016) 1 Inhaler 0  . amLODipine (NORVASC) 5 MG tablet TAKE 1 TABLET (5 MG TOTAL) BY MOUTH DAILY. 90 tablet 2  . cholecalciferol (VITAMIN D) 1000 UNITS tablet Take 2,000 Units by mouth daily.    . hydrochlorothiazide (HYDRODIURIL) 25 MG tablet TAKE ONE TABLET BY MOUTH DAILY 90 tablet 0  . levothyroxine (SYNTHROID, LEVOTHROID) 125 MCG tablet TAKE 1 TABLET BY MOUTH ONCE A DAY 90 tablet 1  . pravastatin (PRAVACHOL) 20 MG tablet TAKE ONE TABLET BY MOUTH DAILY 90 tablet 3  . quinapril (ACCUPRIL) 40 MG tablet TAKE 1 TABLET (40 MG TOTAL) BY MOUTH AT BEDTIME. 90 tablet 1  . vitamin C (ASCORBIC ACID) 500 MG tablet Take 500 mg by mouth daily.     No current facility-administered medications on file prior to visit.     BP (!) 158/80   Temp 98.1 F (36.7 C) (Oral)  Ht 5\' 4"  (1.626 m)   Wt 172 lb 8 oz (78.2 kg)   BMI 29.61 kg/m       Objective:   Physical Exam  Constitutional: She is oriented to person, place, and time. She appears well-developed and well-nourished. No distress.  HENT:  Head: Normocephalic and atraumatic.  Right Ear: External ear normal.  Left Ear: External ear normal.  Nose: Nose normal. Right sinus exhibits no maxillary sinus tenderness and no frontal sinus tenderness. Left sinus exhibits no maxillary sinus tenderness and no frontal sinus tenderness.  Mouth/Throat: Uvula is midline, oropharynx is clear and moist and mucous membranes are normal.  Eyes: Conjunctivae and EOM are normal. Pupils are equal, round, and reactive  to light. Right eye exhibits no discharge. Left eye exhibits no discharge. No scleral icterus.  Neck: Normal range of motion. Neck supple. No thyromegaly present.  Cardiovascular: Normal rate, regular rhythm, normal heart sounds and intact distal pulses.  Exam reveals no gallop and no friction rub.   No murmur heard. Pulmonary/Chest: Effort normal. No respiratory distress. She has wheezes (trace wheezing throughout). She has no rales. She exhibits no tenderness.  Musculoskeletal: Normal range of motion. She exhibits no edema, tenderness or deformity.  Lymphadenopathy:    She has no cervical adenopathy.  Neurological: She is alert and oriented to person, place, and time.  Skin: Skin is warm and dry. No rash noted. She is not diaphoretic. No erythema. No pallor.  Psychiatric: She has a normal mood and affect. Her behavior is normal. Judgment and thought content normal.  Nursing note and vitals reviewed.     Assessment & Plan:  1. Acute bronchitis, unspecified organism - albuterol (PROVENTIL HFA;VENTOLIN HFA) 108 (90 Base) MCG/ACT inhaler; Inhale 2 puffs into the lungs every 6 (six) hours as needed for wheezing or shortness of breath.  Dispense: 1 Inhaler; Refill: 0 - azithromycin (ZITHROMAX Z-PAK) 250 MG tablet; Take 2 tablets on Day 1.  Then take 1 tablet daily.  Dispense: 6 tablet; Refill: 0 - ipratropium-albuterol (DUONEB) 0.5-2.5 (3) MG/3ML nebulizer solution 3 mL; Take 3 mLs by nebulization every 6 (six) hours. - Patient endorsed feeling improved after duo neb in office. On exam her wheezing had resolved.  Dorothyann Peng, NP    Dorothyann Peng, NP

## 2016-08-03 DIAGNOSIS — K644 Residual hemorrhoidal skin tags: Secondary | ICD-10-CM | POA: Diagnosis not present

## 2016-08-03 DIAGNOSIS — K648 Other hemorrhoids: Secondary | ICD-10-CM | POA: Diagnosis not present

## 2016-08-07 ENCOUNTER — Other Ambulatory Visit: Payer: Self-pay | Admitting: Internal Medicine

## 2016-08-24 DIAGNOSIS — K641 Second degree hemorrhoids: Secondary | ICD-10-CM | POA: Diagnosis not present

## 2016-09-12 ENCOUNTER — Other Ambulatory Visit: Payer: Self-pay | Admitting: Internal Medicine

## 2016-09-24 ENCOUNTER — Encounter: Payer: Self-pay | Admitting: Internal Medicine

## 2016-09-24 ENCOUNTER — Ambulatory Visit (INDEPENDENT_AMBULATORY_CARE_PROVIDER_SITE_OTHER): Payer: Medicare HMO | Admitting: Internal Medicine

## 2016-09-24 VITALS — BP 132/78 | HR 66 | Temp 97.6°F | Ht 63.5 in | Wt 172.0 lb

## 2016-09-24 DIAGNOSIS — Z0000 Encounter for general adult medical examination without abnormal findings: Secondary | ICD-10-CM | POA: Diagnosis not present

## 2016-09-24 DIAGNOSIS — I10 Essential (primary) hypertension: Secondary | ICD-10-CM | POA: Diagnosis not present

## 2016-09-24 DIAGNOSIS — E039 Hypothyroidism, unspecified: Secondary | ICD-10-CM

## 2016-09-24 DIAGNOSIS — E785 Hyperlipidemia, unspecified: Secondary | ICD-10-CM | POA: Diagnosis not present

## 2016-09-24 LAB — CBC WITH DIFFERENTIAL/PLATELET
BASOS PCT: 0.7 % (ref 0.0–3.0)
Basophils Absolute: 0 10*3/uL (ref 0.0–0.1)
EOS PCT: 4.5 % (ref 0.0–5.0)
Eosinophils Absolute: 0.2 10*3/uL (ref 0.0–0.7)
HEMATOCRIT: 43.1 % (ref 36.0–46.0)
Hemoglobin: 14.7 g/dL (ref 12.0–15.0)
Lymphocytes Relative: 30.2 % (ref 12.0–46.0)
Lymphs Abs: 1.7 10*3/uL (ref 0.7–4.0)
MCHC: 34.1 g/dL (ref 30.0–36.0)
MCV: 83.7 fl (ref 78.0–100.0)
MONOS PCT: 8.7 % (ref 3.0–12.0)
Monocytes Absolute: 0.5 10*3/uL (ref 0.1–1.0)
NEUTROS ABS: 3.1 10*3/uL (ref 1.4–7.7)
Neutrophils Relative %: 55.9 % (ref 43.0–77.0)
PLATELETS: 227 10*3/uL (ref 150.0–400.0)
RBC: 5.15 Mil/uL — ABNORMAL HIGH (ref 3.87–5.11)
RDW: 12.9 % (ref 11.5–15.5)
WBC: 5.5 10*3/uL (ref 4.0–10.5)

## 2016-09-24 LAB — COMPREHENSIVE METABOLIC PANEL
ALT: 17 U/L (ref 0–35)
AST: 20 U/L (ref 0–37)
Albumin: 4.2 g/dL (ref 3.5–5.2)
Alkaline Phosphatase: 65 U/L (ref 39–117)
BUN: 22 mg/dL (ref 6–23)
CHLORIDE: 103 meq/L (ref 96–112)
CO2: 30 meq/L (ref 19–32)
Calcium: 9.3 mg/dL (ref 8.4–10.5)
Creatinine, Ser: 0.76 mg/dL (ref 0.40–1.20)
GFR: 78.49 mL/min (ref >60.00)
Glucose, Bld: 91 mg/dL (ref 70–99)
POTASSIUM: 4.1 meq/L (ref 3.5–5.1)
Sodium: 141 mEq/L (ref 135–145)
Total Bilirubin: 0.8 mg/dL (ref 0.2–1.2)
Total Protein: 6.7 g/dL (ref 6.0–8.3)

## 2016-09-24 LAB — LIPID PANEL
CHOL/HDL RATIO: 4
Cholesterol: 207 mg/dL — ABNORMAL HIGH (ref 0–200)
HDL: 49.6 mg/dL (ref >39.00)
LDL CALC: 134 mg/dL — AB (ref 0–99)
NonHDL: 157.25
TRIGLYCERIDES: 117 mg/dL (ref 0.0–149.0)
VLDL: 23.4 mg/dL (ref 0.0–40.0)

## 2016-09-24 LAB — TSH: TSH: 4.61 u[IU]/mL — ABNORMAL HIGH (ref 0.35–4.50)

## 2016-09-24 MED ORDER — ESCITALOPRAM OXALATE 5 MG PO TABS: 5.0000 mg | ORAL_TABLET | Freq: Every day | ORAL | 2 refills | Status: DC

## 2016-09-24 NOTE — Progress Notes (Signed)
Pre visit review using our clinic review tool, if applicable. No additional management support is needed unless otherwise documented below in the visit note. 

## 2016-09-24 NOTE — Progress Notes (Signed)
Subjective:    Patient ID: Teresa Rangel, female    DOB: 09/23/1940, 76 y.o.   MRN: [id number]  HPI  76 year old patient who is seen today for a preventive health examination as well as a annual Medicare wellness visit. She has a history of essential hypertension as well as mild dyslipidemia.  She remains on statin therapy. She has seen GI recently due to hemorrhoidal disease that was treated by banding. She has had some reactive depression recently due to the loss of her dog approximately 2 months ago secondary to heart failure She has hypothyroidism  Colonoscopy 2014 Past Medical History:  Diagnosis Date  . History of stomach ulcers 1980  . Hyperlipidemia   . Hypertension   . Hypothyroidism      Social History   Social History  . Marital status: Married    Spouse name: N/A  . Number of children: N/A  . Years of education: N/A   Occupational History  . Not on file.   Social History Main Topics  . Smoking status: Never Smoker  . Smokeless tobacco: Never Used  . Alcohol use 0.6 oz/week    1 Glasses of wine per week  . Drug use: No  . Sexual activity: Yes    Partners: Male   Other Topics Concern  . Not on file   Social History Narrative  . No narrative on file    Past Surgical History:  Procedure Laterality Date  . APPENDECTOMY  1954  . COLONOSCOPY    . LAPAROSCOPY  1982  . THYROIDECTOMY  2007  . TONSILLECTOMY  1949    No family history on file.  Allergies  Allergen Reactions  . Fluzone [Flu Virus Vaccine] Other (See Comments)    Lethargic, muscle aches, flat on back for 3 days  . Tetracyclines & Related     Stomach cramps    Current Outpatient Prescriptions on File Prior to Visit  Medication Sig Dispense Refill  . albuterol (PROVENTIL HFA;VENTOLIN HFA) 108 (90 Base) MCG/ACT inhaler Inhale 2 puffs into the lungs every 6 (six) hours as needed for wheezing or shortness of breath. 1 Inhaler 3  . cholecalciferol (VITAMIN D) 1000 UNITS tablet Take  2,000 Units by mouth daily.    . hydrochlorothiazide (HYDRODIURIL) 25 MG tablet TAKE ONE TABLET BY MOUTH DAILY 90 tablet 0  . levothyroxine (SYNTHROID, LEVOTHROID) 125 MCG tablet TAKE 1 TABLET BY MOUTH ONCE A DAY 90 tablet 1  . pravastatin (PRAVACHOL) 20 MG tablet TAKE ONE TABLET BY MOUTH DAILY 90 tablet 3  . quinapril (ACCUPRIL) 40 MG tablet TAKE 1 TABLET (40 MG TOTAL) BY MOUTH AT BEDTIME. 90 tablet 1  . vitamin C (ASCORBIC ACID) 500 MG tablet Take 500 mg by mouth daily.     Current Facility-Administered Medications on File Prior to Visit  Medication Dose Route Frequency Provider Last Rate Last Dose  . ipratropium-albuterol (DUONEB) 0.5-2.5 (3) MG/3ML nebulizer solution 3 mL  3 mL Nebulization Q6H Cory Nafziger, NP        BP 132/78 (BP Location: Right Arm, Patient Position: Sitting, Cuff Size: Normal)   Pulse 66   Temp 97.6 F (36.4 C) (Oral)   Ht 5' 3.5" (1.613 m)   Wt 172 lb (78 kg)   SpO2 98%   BMI 29.99 kg/m   Medicare wellness visit  1. Risk factors, based on past  M,S,F history.  Cardiovascular risk factors include hypertension and dyslipidemia  2.  Physical activities:remains fairly active.  Does participate in  a walking program.  Presently a weight watchers program  3.  Depression/mood:reactive depression over the past 2 months secondary to the death of her dog  4.  Hearing:no deficits  5.  ADL's:independent  6.  Fall risk:low  7.  Home safety:no problems identified  8.  Height weight, and visual acuity;height and weight stable no change in visual acuity  9.  Counseling:continue heart healthy diet and efforts at aggressive risk factor modification.  Continue efforts at weight loss  10. Lab orders based on risk factors:laboratory profile reviewed, including lipid profile  11. Referral :not appropriate at this time  12. Care plan:continue efforts at weight loss.  More rigorous exercise regimen encouraged  13. Cognitive assessment: alert and oriented with normal  affect no cognitive dysfunction 14. Screening: Patient provided with a written and personalized 5-10 year screening schedule in the AVS.    15. Provider List Update: GI primary care and radiology, as well as ophthalmology    Review of Systems  Constitutional: Negative.   HENT: Negative for congestion, dental problem, hearing loss, rhinorrhea, sinus pressure, sore throat and tinnitus.   Eyes: Negative for pain, discharge and visual disturbance.  Respiratory: Negative for cough and shortness of breath.   Cardiovascular: Negative for chest pain, palpitations and leg swelling.  Gastrointestinal: Negative for abdominal distention, abdominal pain, blood in stool, constipation, diarrhea, nausea and vomiting.  Genitourinary: Negative for difficulty urinating, dysuria, flank pain, frequency, hematuria, pelvic pain, urgency, vaginal bleeding, vaginal discharge and vaginal pain.  Musculoskeletal: Negative for arthralgias, gait problem and joint swelling.  Skin: Negative for rash.  Neurological: Negative for dizziness, syncope, speech difficulty, weakness, numbness and headaches.  Hematological: Negative for adenopathy.  Psychiatric/Behavioral: Positive for dysphoric mood and sleep disturbance. Negative for agitation and behavioral problems. The patient is not nervous/anxious.        Objective:   Physical Exam  Constitutional: She is oriented to person, place, and time. She appears well-developed and well-nourished.  HENT:  Head: Normocephalic and atraumatic.  Right Ear: External ear normal.  Left Ear: External ear normal.  Mouth/Throat: Oropharynx is clear and moist.  Eyes: Conjunctivae and EOM are normal.  Neck: Normal range of motion. Neck supple. No JVD present. No thyromegaly present.  Cardiovascular: Normal rate, regular rhythm, normal heart sounds and intact distal pulses.   No murmur heard. Pulmonary/Chest: Effort normal and breath sounds normal. She has no wheezes. She has no rales.    Abdominal: Soft. Bowel sounds are normal. She exhibits no distension and no mass. There is no tenderness. There is no rebound and no guarding.  Genitourinary: Vagina normal.  Musculoskeletal: Normal range of motion. She exhibits no edema or tenderness.  Neurological: She is alert and oriented to person, place, and time. She has normal reflexes. No cranial nerve deficit. She exhibits normal muscle tone. Coordination normal.  Skin: Skin is warm and dry. No rash noted.  Psychiatric: She has a normal mood and affect. Her behavior is normal.          Assessment & Plan:   Preventive health examination Annual Medicare wellness visit Essential hypertension, well-controlled Dyslipidemia.  Continue statin therapy.  Review lipid profile Exogenous obesity.  Continue efforts at weight loss Grief reaction.  Will start short-term SSRI therapy.  Reassess.  3 months.  Consider taper and discontinuation at that time Hypothyroidism.  Review TSH  Nyoka Cowden

## 2016-09-24 NOTE — Patient Instructions (Addendum)
Limit your sodium (Salt) intake  Please check your blood pressure on a regular basis.  If it is consistently greater than 150/90, please make an office appointment.    It is important that you exercise regularly, at least 20 minutes 3 to 4 times per week.  If you develop chest pain or shortness of breath seek  medical attention.  Take a calcium supplement, plus 800-1200 units of vitamin D   Health Maintenance for Postmenopausal Women Introduction Menopause is a normal process in which your reproductive ability comes to an end. This process happens gradually over a span of months to years, usually between the ages of 48 and 55. Menopause is complete when you have missed 12 consecutive menstrual periods. It is important to talk with your health care provider about some of the most common conditions that affect postmenopausal women, such as heart disease, cancer, and bone loss (osteoporosis). Adopting a healthy lifestyle and getting preventive care can help to promote your health and wellness. Those actions can also lower your chances of developing some of these common conditions. What should I know about menopause? During menopause, you may experience a number of symptoms, such as:  Moderate-to-severe hot flashes.  Night sweats.  Decrease in sex drive.  Mood swings.  Headaches.  Tiredness.  Irritability.  Memory problems.  Insomnia. Choosing to treat or not to treat menopausal changes is an individual decision that you make with your health care provider. What should I know about hormone replacement therapy and supplements? Hormone therapy products are effective for treating symptoms that are associated with menopause, such as hot flashes and night sweats. Hormone replacement carries certain risks, especially as you become older. If you are thinking about using estrogen or estrogen with progestin treatments, discuss the benefits and risks with your health care provider. What should  I know about heart disease and stroke? Heart disease, heart attack, and stroke become more likely as you age. This may be due, in part, to the hormonal changes that your body experiences during menopause. These can affect how your body processes dietary fats, triglycerides, and cholesterol. Heart attack and stroke are both medical emergencies. There are many things that you can do to help prevent heart disease and stroke:  Have your blood pressure checked at least every 1-2 years. High blood pressure causes heart disease and increases the risk of stroke.  If you are 55-79 years old, ask your health care provider if you should take aspirin to prevent a heart attack or a stroke.  Do not use any tobacco products, including cigarettes, chewing tobacco, or electronic cigarettes. If you need help quitting, ask your health care provider.  It is important to eat a healthy diet and maintain a healthy weight.  Be sure to include plenty of vegetables, fruits, low-fat dairy products, and lean protein.  Avoid eating foods that are high in solid fats, added sugars, or salt (sodium).  Get regular exercise. This is one of the most important things that you can do for your health.  Try to exercise for at least 150 minutes each week. The type of exercise that you do should increase your heart rate and make you sweat. This is known as moderate-intensity exercise.  Try to do strengthening exercises at least twice each week. Do these in addition to the moderate-intensity exercise.  Know your numbers.Ask your health care provider to check your cholesterol and your blood glucose. Continue to have your blood tested as directed by your health care provider.   What should I know about cancer screening? There are several types of cancer. Take the following steps to reduce your risk and to catch any cancer development as early as possible. Breast Cancer  Practice breast self-awareness.  This means understanding how  your breasts normally appear and feel.  It also means doing regular breast self-exams. Let your health care provider know about any changes, no matter how small.  If you are 40 or older, have a clinician do a breast exam (clinical breast exam or CBE) every year. Depending on your age, family history, and medical history, it may be recommended that you also have a yearly breast X-ray (mammogram).  If you have a family history of breast cancer, talk with your health care provider about genetic screening.  If you are at high risk for breast cancer, talk with your health care provider about having an MRI and a mammogram every year.  Breast cancer (BRCA) gene test is recommended for women who have family members with BRCA-related cancers. Results of the assessment will determine the need for genetic counseling and BRCA1 and for BRCA2 testing. BRCA-related cancers include these types:  Breast. This occurs in males or females.  Ovarian.  Tubal. This may also be called fallopian tube cancer.  Cancer of the abdominal or pelvic lining (peritoneal cancer).  Prostate.  Pancreatic. Cervical, Uterine, and Ovarian Cancer  Your health care provider may recommend that you be screened regularly for cancer of the pelvic organs. These include your ovaries, uterus, and vagina. This screening involves a pelvic exam, which includes checking for microscopic changes to the surface of your cervix (Pap test).  For women ages 21-65, health care providers may recommend a pelvic exam and a Pap test every three years. For women ages 30-65, they may recommend the Pap test and pelvic exam, combined with testing for human papilloma virus (HPV), every five years. Some types of HPV increase your risk of cervical cancer. Testing for HPV may also be done on women of any age who have unclear Pap test results.  Other health care providers may not recommend any screening for nonpregnant women who are considered low risk for  pelvic cancer and have no symptoms. Ask your health care provider if a screening pelvic exam is right for you.  If you have had past treatment for cervical cancer or a condition that could lead to cancer, you need Pap tests and screening for cancer for at least 20 years after your treatment. If Pap tests have been discontinued for you, your risk factors (such as having a new sexual partner) need to be reassessed to determine if you should start having screenings again. Some women have medical problems that increase the chance of getting cervical cancer. In these cases, your health care provider may recommend that you have screening and Pap tests more often.  If you have a family history of uterine cancer or ovarian cancer, talk with your health care provider about genetic screening.  If you have vaginal bleeding after reaching menopause, tell your health care provider.  There are currently no reliable tests available to screen for ovarian cancer. Lung Cancer  Lung cancer screening is recommended for adults 55-80 years old who are at high risk for lung cancer because of a history of smoking. A yearly low-dose CT scan of the lungs is recommended if you:  Currently smoke.  Have a history of at least 30 pack-years of smoking and you currently smoke or have quit within the past   15 years. A pack-year is smoking an average of one pack of cigarettes per day for one year. Yearly screening should:  Continue until it has been 15 years since you quit.  Stop if you develop a health problem that would prevent you from having lung cancer treatment. Colorectal Cancer  This type of cancer can be detected and can often be prevented.  Routine colorectal cancer screening usually begins at age 50 and continues through age 75.  If you have risk factors for colon cancer, your health care provider may recommend that you be screened at an earlier age.  If you have a family history of colorectal cancer, talk with  your health care provider about genetic screening.  Your health care provider may also recommend using home test kits to check for hidden blood in your stool.  A small camera at the end of a tube can be used to examine your colon directly (sigmoidoscopy or colonoscopy). This is done to check for the earliest forms of colorectal cancer.  Direct examination of the colon should be repeated every 5-10 years until age 75. However, if early forms of precancerous polyps or small growths are found or if you have a family history or genetic risk for colorectal cancer, you may need to be screened more often. Skin Cancer  Check your skin from head to toe regularly.  Monitor any moles. Be sure to tell your health care provider:  About any new moles or changes in moles, especially if there is a change in a mole's shape or color.  If you have a mole that is larger than the size of a pencil eraser.  If any of your family members has a history of skin cancer, especially at a young age, talk with your health care provider about genetic screening.  Always use sunscreen. Apply sunscreen liberally and repeatedly throughout the day.  Whenever you are outside, protect yourself by wearing long sleeves, pants, a wide-brimmed hat, and sunglasses. What should I know about osteoporosis? Osteoporosis is a condition in which bone destruction happens more quickly than new bone creation. After menopause, you may be at an increased risk for osteoporosis. To help prevent osteoporosis or the bone fractures that can happen because of osteoporosis, the following is recommended:  If you are 19-50 years old, get at least 1,000 mg of calcium and at least 600 mg of vitamin D per day.  If you are older than age 50 but younger than age 70, get at least 1,200 mg of calcium and at least 600 mg of vitamin D per day.  If you are older than age 70, get at least 1,200 mg of calcium and at least 800 mg of vitamin D per day. Smoking  and excessive alcohol intake increase the risk of osteoporosis. Eat foods that are rich in calcium and vitamin D, and do weight-bearing exercises several times each week as directed by your health care provider. What should I know about how menopause affects my mental health? Depression may occur at any age, but it is more common as you become older. Common symptoms of depression include:  Low or sad mood.  Changes in sleep patterns.  Changes in appetite or eating patterns.  Feeling an overall lack of motivation or enjoyment of activities that you previously enjoyed.  Frequent crying spells. Talk with your health care provider if you think that you are experiencing depression. What should I know about immunizations? It is important that you get and maintain your   immunizations. These include:  Tetanus, diphtheria, and pertussis (Tdap) booster vaccine.  Influenza every year before the flu season begins.  Pneumonia vaccine.  Shingles vaccine. Your health care provider may also recommend other immunizations. This information is not intended to replace advice given to you by your health care provider. Make sure you discuss any questions you have with your health care provider. Document Released: 10/22/2005 Document Revised: 03/19/2016 Document Reviewed: 06/03/2015  2017 [employer]vier  

## 2016-09-30 ENCOUNTER — Encounter: Payer: Self-pay | Admitting: Internal Medicine

## 2016-10-04 DIAGNOSIS — K641 Second degree hemorrhoids: Secondary | ICD-10-CM | POA: Diagnosis not present

## 2016-10-14 ENCOUNTER — Other Ambulatory Visit: Payer: Self-pay | Admitting: Internal Medicine

## 2016-10-14 DIAGNOSIS — Z1231 Encounter for screening mammogram for malignant neoplasm of breast: Secondary | ICD-10-CM

## 2016-11-11 ENCOUNTER — Ambulatory Visit
Admission: RE | Admit: 2016-11-11 | Discharge: 2016-11-11 | Disposition: A | Discharge status: Home / Self Care | Payer: Medicare HMO | Source: Ambulatory Visit | Attending: Internal Medicine | Admitting: Internal Medicine

## 2016-11-11 DIAGNOSIS — Z1231 Encounter for screening mammogram for malignant neoplasm of breast: Secondary | ICD-10-CM | POA: Diagnosis not present

## 2016-11-19 ENCOUNTER — Encounter: Payer: Self-pay | Admitting: Internal Medicine

## 2016-11-19 ENCOUNTER — Ambulatory Visit (INDEPENDENT_AMBULATORY_CARE_PROVIDER_SITE_OTHER): Payer: Medicare HMO | Admitting: Internal Medicine

## 2016-11-19 VITALS — BP 120/80 | HR 75 | Temp 97.7°F | Ht 63.5 in | Wt 166.2 lb

## 2016-11-19 DIAGNOSIS — J029 Acute pharyngitis, unspecified: Secondary | ICD-10-CM

## 2016-11-19 DIAGNOSIS — Z20818 Contact with and (suspected) exposure to other bacterial communicable diseases: Secondary | ICD-10-CM | POA: Diagnosis not present

## 2016-11-19 LAB — POCT RAPID STREP A (OFFICE): Rapid Strep A Screen: NEGATIVE

## 2016-11-19 MED ORDER — AMOXICILLIN-POT CLAVULANATE 875-125 MG PO TABS: 1.0000 | ORAL_TABLET | Freq: Two times a day (BID) | ORAL | 0 refills | Status: DC

## 2016-11-19 NOTE — Patient Instructions (Addendum)
This could be postnasal drainage on the left side of your right like sinusitis. Most of these  Infections  get better with time Flonase saline and gargles. However if you're having increasing drainage on the left side swollen glands and/or fever we can add an antibiotic. If you get a fever or serious pain contact the health care team. We are also checking for strep because you have been exposed. There is no guarantee of any intervention to prevent an irritated bronchitis. However if you have inhalers you've used in the past you can begin as if you coughing.

## 2016-11-19 NOTE — Progress Notes (Signed)
Chief Complaint  Patient presents with  . Sore Throat    started monday     HPI: Teresa Rangel 76 y.o.  Onset 5 days of st that is continuing and seems to be left more than right   Some  runn y nose.    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  No fever  Achy  Ibuprofen  No fever  Concern about getting bronchitis  Had blister top of mouth that bled  No Yuille lesion  Or vomiting diarrhea rash .    ROS: See pertinent positives and negatives per HPI. No sob henoptysis  Some cristy stuff left nose but no dc now  Past Medical History:  Diagnosis Date  . History of stomach ulcers 1980  . Hyperlipidemia   . Hypertension   . Hypothyroidism     Family History  Problem Relation Age of Onset  . Breast cancer Maternal Aunt     Social History   Social History  . Marital status: Married    Spouse name: N/A  . Number of children: N/A  . Years of education: N/A   Social History Main Topics  . Smoking status: Never Smoker  . Smokeless tobacco: Never Used  . Alcohol use 0.6 oz/week    1 Glasses of wine per week  . Drug use: No  . Sexual activity: Yes    Partners: Male   Other Topics Concern  . None   Social History Narrative  . None    Outpatient Medications Prior to Visit  Medication Sig Dispense Refill  . albuterol (PROVENTIL HFA;VENTOLIN HFA) 108 (90 Base) MCG/ACT inhaler Inhale 2 puffs into the lungs every 6 (six) hours as needed for wheezing or shortness of breath. 1 Inhaler 3  . amLODipine (NORVASC) 5 MG tablet Take 5 mg by mouth daily.    . cholecalciferol (VITAMIN D) 1000 UNITS tablet Take 2,000 Units by mouth daily.    Marland Kitchen escitalopram (LEXAPRO) 5 MG tablet Take 1 tablet (5 mg total) by mouth daily. 60 tablet 2  . hydrochlorothiazide (HYDRODIURIL) 25 MG tablet TAKE ONE TABLET BY MOUTH DAILY 90 tablet 0  . levothyroxine (SYNTHROID, LEVOTHROID) 125 MCG tablet TAKE 1 TABLET BY  MOUTH ONCE A DAY 90 tablet 1  . pravastatin (PRAVACHOL) 20 MG tablet TAKE ONE TABLET BY MOUTH DAILY 90 tablet 3  . quinapril (ACCUPRIL) 40 MG tablet TAKE 1 TABLET (40 MG TOTAL) BY MOUTH AT BEDTIME. 90 tablet 1  . vitamin C (ASCORBIC ACID) 500 MG tablet Take 500 mg by mouth daily.     Facility-Administered Medications Prior to Visit  Medication Dose Route Frequency Provider Last Rate Last Dose  . ipratropium-albuterol (DUONEB) 0.5-2.5 (3) MG/3ML nebulizer solution 3 mL  3 mL Nebulization Q6H Cory Nafziger, NP         EXAM:  BP 120/80 (BP Location: Right Arm, Patient Position: Sitting, Cuff Size: Normal)   Pulse 75   Temp 97.7 F (36.5 C) (Oral)   Ht 5' 3.5" (1.613 m)   Wt 166 lb 3.2 oz (75.4 kg)   SpO2 97%   BMI 28.98 kg/m   Body mass index is 28.98 kg/m.  GENERAL: vitals reviewed and listed above, alert, oriented, appears well hydrated and in no acute distress mildy hoarse   Non toxic  HEENT: atraumatic, conjunctiva  clear, no obvious abnormalities on inspection  of external nose and ears tmx clear  OP : no lesion edema or exudate  Red on left more than right no lesion   cobblestoning  NECK: no obvious masses on inspection palpation  Left ac tenderness  LUNGS: clear to auscultation bilaterally, no wheezes, rales or rhonchi, good air movement CV: HRRR, no clubbing cyanosis or  peripheral edema nl cap refill  MS: moves all extremities without noticeable focal  abnormality PSYCH: pleasant and cooperative, no obvious depression or anxiety  ASSESSMENT AND PLAN:  Discussed the following assessment and plan:  Sore throat - mostl;y left sided poss  midl sinusitis left  expectant management can add antibiotic if appropriate - Plan: POC Rapid Strep A, Culture, Group A Strep  Exposure to strep throat - Plan: POC Rapid Strep A, Culture, Group A Strep Poss  Left sided mild sinustiis   Expectant management. Add antibiotic if needed  Over weekend  Fu with alarm sx  -Patient advised to  return or notify health care team  if symptoms worsen ,persist or new concerns arise.  Patient Instructions  This could be postnasal drainage on the left side of your right like sinusitis. Most of these  Infections  get better with time Flonase saline and gargles. However if you're having increasing drainage on the left side swollen glands and/or fever we can add an antibiotic. If you get a fever or serious pain contact the health care team. We are also checking for strep because you have been exposed. There is no guarantee of any intervention to prevent an irritated bronchitis. However if you have inhalers you've used in the past you can begin as if you coughing.       Standley Brooking. Consuela Widener M.D.

## 2016-11-21 LAB — CULTURE, GROUP A STREP

## 2016-11-22 ENCOUNTER — Other Ambulatory Visit: Payer: Self-pay | Admitting: Internal Medicine

## 2016-12-11 ENCOUNTER — Other Ambulatory Visit: Payer: Self-pay | Admitting: Internal Medicine

## 2016-12-24 ENCOUNTER — Ambulatory Visit (INDEPENDENT_AMBULATORY_CARE_PROVIDER_SITE_OTHER): Payer: Medicare HMO | Admitting: Internal Medicine

## 2016-12-24 ENCOUNTER — Encounter: Payer: Self-pay | Admitting: Internal Medicine

## 2016-12-24 VITALS — BP 124/62 | HR 75 | Temp 98.3°F | Ht 63.5 in | Wt 168.5 lb

## 2016-12-24 DIAGNOSIS — I10 Essential (primary) hypertension: Secondary | ICD-10-CM | POA: Diagnosis not present

## 2016-12-24 DIAGNOSIS — E039 Hypothyroidism, unspecified: Secondary | ICD-10-CM | POA: Diagnosis not present

## 2016-12-24 DIAGNOSIS — E785 Hyperlipidemia, unspecified: Secondary | ICD-10-CM

## 2016-12-24 NOTE — Patient Instructions (Signed)
Decrease escitalopram 1 half tablet daily for 3 weeks, then 1 half tablet every other day for an additional 3 weeks, then discontinue completely

## 2016-12-24 NOTE — Progress Notes (Signed)
Subjective:    Patient ID: Teresa Rangel, female    DOB: October 08, 1940, 76 y.o.   MRN: [id number]  HPI  76 year old patient who is seen today in follow-up.  She was seen a few months ago  And  quite depressed about the loss of her dog.  She has obtained a new rescue dog.  Clinically she has done quite .  The plan was to taper and discontinue the medication if she was stable  Past Medical History:  Diagnosis Date  . History of stomach ulcers 1980  . Hyperlipidemia   . Hypertension   . Hypothyroidism      Social History   Social History  . Marital status: Married    Spouse name: N/A  . Number of children: N/A  . Years of education: N/A   Occupational History  . Not on file.   Social History Main Topics  . Smoking status: Never Smoker  . Smokeless tobacco: Never Used  . Alcohol use 0.6 oz/week    1 Glasses of wine per week  . Drug use: No  . Sexual activity: Yes    Partners: Male   Other Topics Concern  . Not on file   Social History Narrative  . No narrative on file    Past Surgical History:  Procedure Laterality Date  . APPENDECTOMY  1954  . COLONOSCOPY    . LAPAROSCOPY  1982  . THYROIDECTOMY  2007  . TONSILLECTOMY  1949    Family History  Problem Relation Age of Onset  . Breast cancer Maternal Aunt     Allergies  Allergen Reactions  . Fluzone [Flu Virus Vaccine] Other (See Comments)    Lethargic, muscle aches, flat on back for 3 days  . Tetracyclines & Related     Stomach cramps    Current Outpatient Prescriptions on File Prior to Visit  Medication Sig Dispense Refill  . albuterol (PROVENTIL HFA;VENTOLIN HFA) 108 (90 Base) MCG/ACT inhaler Inhale 2 puffs into the lungs every 6 (six) hours as needed for wheezing or shortness of breath. 1 Inhaler 3  . amLODipine (NORVASC) 5 MG tablet Take 5 mg by mouth daily.    . cholecalciferol (VITAMIN D) 1000 UNITS tablet Take 2,000 Units by mouth daily.    Marland Kitchen escitalopram (LEXAPRO) 5 MG tablet Take 1 tablet (5 mg  total) by mouth daily. 60 tablet 2  . hydrochlorothiazide (HYDRODIURIL) 25 MG tablet TAKE ONE TABLET BY MOUTH DAILY 90 tablet 0  . levothyroxine (SYNTHROID, LEVOTHROID) 125 MCG tablet TAKE 1 TABLET BY MOUTH ONCE A DAY 90 tablet 1  . pravastatin (PRAVACHOL) 20 MG tablet TAKE ONE TABLET BY MOUTH DAILY 90 tablet 3  . quinapril (ACCUPRIL) 40 MG tablet TAKE 1 TABLET (40 MG TOTAL) BY MOUTH AT BEDTIME. 90 tablet 0  . vitamin C (ASCORBIC ACID) 500 MG tablet Take 500 mg by mouth daily.     Current Facility-Administered Medications on File Prior to Visit  Medication Dose Route Frequency Provider Last Rate Last Dose  . ipratropium-albuterol (DUONEB) 0.5-2.5 (3) MG/3ML nebulizer solution 3 mL  3 mL Nebulization Q6H Cory Nafziger, NP        BP 124/62 (BP Location: Left Arm, Patient Position: Sitting, Cuff Size: Normal)   Pulse 75   Temp 98.3 F (36.8 C) (Oral)   Ht 5' 3.5" (1.613 m)   Wt 168 lb 8 oz (76.4 kg)   SpO2 98%   BMI 29.38 kg/m    Review of Systems  Constitutional:  Negative.   Psychiatric/Behavioral: Positive for dysphoric mood. The patient is nervous/anxious.        Objective:   Physical Exam  Constitutional: She appears well-developed and well-nourished. No distress.  Bright affect  Psychiatric: She has a normal mood and affect. Her behavior is normal. Judgment and thought content normal.          Assessment & Plan:   Reactive depression/grief reaction, resolved.  We'll taper and discontinue Lexapro  Follow-up 6 months or as needed  [employer]

## 2016-12-24 NOTE — Progress Notes (Signed)
Pre visit review using our clinic review tool, if applicable. No additional management support is needed unless otherwise documented below in the visit note. 

## 2016-12-30 ENCOUNTER — Other Ambulatory Visit: Payer: Self-pay | Admitting: Internal Medicine

## 2017-01-01 ENCOUNTER — Other Ambulatory Visit: Payer: Self-pay | Admitting: Internal Medicine

## 2017-01-11 DIAGNOSIS — R0600 Dyspnea, unspecified: Secondary | ICD-10-CM | POA: Diagnosis not present

## 2017-01-11 DIAGNOSIS — E7800 Pure hypercholesterolemia, unspecified: Secondary | ICD-10-CM | POA: Diagnosis not present

## 2017-01-11 DIAGNOSIS — I10 Essential (primary) hypertension: Secondary | ICD-10-CM | POA: Diagnosis not present

## 2017-01-11 DIAGNOSIS — Z0000 Encounter for general adult medical examination without abnormal findings: Secondary | ICD-10-CM | POA: Diagnosis not present

## 2017-01-11 DIAGNOSIS — Z79899 Other long term (current) drug therapy: Secondary | ICD-10-CM | POA: Diagnosis not present

## 2017-01-11 DIAGNOSIS — E039 Hypothyroidism, unspecified: Secondary | ICD-10-CM | POA: Diagnosis not present

## 2017-01-11 DIAGNOSIS — G4700 Insomnia, unspecified: Secondary | ICD-10-CM | POA: Diagnosis not present

## 2017-03-16 ENCOUNTER — Other Ambulatory Visit: Payer: Self-pay | Admitting: Internal Medicine

## 2017-03-19 ENCOUNTER — Other Ambulatory Visit: Payer: Self-pay | Admitting: Internal Medicine

## 2017-04-04 DIAGNOSIS — R159 Full incontinence of feces: Secondary | ICD-10-CM | POA: Diagnosis not present

## 2017-06-02 ENCOUNTER — Encounter: Payer: Self-pay | Admitting: Internal Medicine

## 2017-07-07 ENCOUNTER — Other Ambulatory Visit: Payer: Self-pay | Admitting: Internal Medicine

## 2017-08-01 ENCOUNTER — Ambulatory Visit: Payer: Medicare HMO | Admitting: Internal Medicine

## 2017-08-08 ENCOUNTER — Other Ambulatory Visit: Payer: Self-pay | Admitting: Internal Medicine

## 2017-09-09 ENCOUNTER — Other Ambulatory Visit: Payer: Self-pay | Admitting: Internal Medicine

## 2017-09-24 ENCOUNTER — Other Ambulatory Visit: Payer: Self-pay | Admitting: Internal Medicine

## 2017-09-26 ENCOUNTER — Other Ambulatory Visit: Payer: Self-pay | Admitting: Internal Medicine

## 2017-10-09 ENCOUNTER — Other Ambulatory Visit: Payer: Self-pay | Admitting: Internal Medicine

## 2017-10-24 ENCOUNTER — Other Ambulatory Visit: Payer: Self-pay | Admitting: Internal Medicine

## 2017-10-24 DIAGNOSIS — Z1231 Encounter for screening mammogram for malignant neoplasm of breast: Secondary | ICD-10-CM

## 2017-11-14 ENCOUNTER — Ambulatory Visit
Admission: RE | Admit: 2017-11-14 | Discharge: 2017-11-14 | Disposition: A | Discharge status: Home / Self Care | Payer: Medicare HMO | Source: Ambulatory Visit | Attending: Internal Medicine | Admitting: Internal Medicine

## 2017-11-14 ENCOUNTER — Ambulatory Visit: Payer: Medicare HMO

## 2017-11-14 DIAGNOSIS — Z1231 Encounter for screening mammogram for malignant neoplasm of breast: Secondary | ICD-10-CM

## 2017-12-08 ENCOUNTER — Other Ambulatory Visit: Payer: Self-pay | Admitting: Internal Medicine

## 2017-12-19 ENCOUNTER — Encounter: Payer: Self-pay | Admitting: Internal Medicine

## 2017-12-19 ENCOUNTER — Ambulatory Visit (INDEPENDENT_AMBULATORY_CARE_PROVIDER_SITE_OTHER): Payer: Medicare HMO | Admitting: Internal Medicine

## 2017-12-19 VITALS — BP 130/80 | HR 85 | Temp 98.4°F | Wt 165.0 lb

## 2017-12-19 DIAGNOSIS — I10 Essential (primary) hypertension: Secondary | ICD-10-CM

## 2017-12-19 DIAGNOSIS — E785 Hyperlipidemia, unspecified: Secondary | ICD-10-CM

## 2017-12-19 DIAGNOSIS — Z0000 Encounter for general adult medical examination without abnormal findings: Secondary | ICD-10-CM

## 2017-12-19 DIAGNOSIS — E039 Hypothyroidism, unspecified: Secondary | ICD-10-CM | POA: Diagnosis not present

## 2017-12-19 LAB — CBC WITH DIFFERENTIAL/PLATELET
Basophils Absolute: 0 10*3/uL (ref 0.0–0.1)
Basophils Relative: 0.8 % (ref 0.0–3.0)
EOS PCT: 6.3 % — AB (ref 0.0–5.0)
Eosinophils Absolute: 0.3 10*3/uL (ref 0.0–0.7)
HCT: 43.2 % (ref 36.0–46.0)
Hemoglobin: 15.2 g/dL — ABNORMAL HIGH (ref 12.0–15.0)
LYMPHS ABS: 1.7 10*3/uL (ref 0.7–4.0)
Lymphocytes Relative: 31.1 % (ref 12.0–46.0)
MCHC: 35.2 g/dL (ref 30.0–36.0)
MCV: 80.8 fl (ref 78.0–100.0)
MONO ABS: 0.5 10*3/uL (ref 0.1–1.0)
Monocytes Relative: 9.3 % (ref 3.0–12.0)
NEUTROS PCT: 52.5 % (ref 43.0–77.0)
Neutro Abs: 2.9 10*3/uL (ref 1.4–7.7)
Platelets: 210 10*3/uL (ref 150.0–400.0)
RBC: 5.34 Mil/uL — AB (ref 3.87–5.11)
RDW: 13.4 % (ref 11.5–15.5)
WBC: 5.5 10*3/uL (ref 4.0–10.5)

## 2017-12-19 LAB — COMPREHENSIVE METABOLIC PANEL
ALK PHOS: 75 U/L (ref 39–117)
ALT: 17 U/L (ref 0–35)
AST: 19 U/L (ref 0–37)
Albumin: 4 g/dL (ref 3.5–5.2)
BILIRUBIN TOTAL: 0.5 mg/dL (ref 0.2–1.2)
BUN: 18 mg/dL (ref 6–23)
CO2: 29 mEq/L (ref 19–32)
CREATININE: 0.71 mg/dL (ref 0.40–1.20)
Calcium: 9.2 mg/dL (ref 8.4–10.5)
Chloride: 103 mEq/L (ref 96–112)
GFR: 84.63 mL/min (ref >60.00)
GLUCOSE: 89 mg/dL (ref 70–99)
Potassium: 3.7 mEq/L (ref 3.5–5.1)
SODIUM: 142 meq/L (ref 135–145)
TOTAL PROTEIN: 6.7 g/dL (ref 6.0–8.3)

## 2017-12-19 LAB — LIPID PANEL
Cholesterol: 176 mg/dL (ref 0–200)
HDL: 47.6 mg/dL (ref >39.00)
LDL Cholesterol: 102 mg/dL — ABNORMAL HIGH (ref 0–99)
NonHDL: 128.62
Total CHOL/HDL Ratio: 4
Triglycerides: 134 mg/dL (ref 0.0–149.0)
VLDL: 26.8 mg/dL (ref 0.0–40.0)

## 2017-12-19 LAB — TSH

## 2017-12-19 NOTE — Progress Notes (Signed)
Subjective:    Patient ID: Teresa Rangel, female    DOB: 1941/09/05, 77 y.o.   MRN: [id number]  HPI  77 year old patient who is seen today for a preventive health examination as well as a subsequent Medicare wellness visit. She has a history of essential hypertension and dyslipidemia.  She is status post thyroidectomy and remains on levothyroxine. Complaints today include insomnia occasional gouty arthritis involving her right great toe as well as mild tinnitus She has been followed by GI and general surgery for hemorrhoidal disease Colonoscopy 2014  Past Medical History:  Diagnosis Date  . History of stomach ulcers 1980  . Hyperlipidemia   . Hypertension   . Hypothyroidism      Social History   Socioeconomic History  . Marital status: Married    Spouse name: Not on file  . Number of children: Not on file  . Years of education: Not on file  . Highest education level: Not on file  Occupational History  . Not on file  Social Needs  . Financial resource strain: Not on file  . Food insecurity:    Worry: Not on file    Inability: Not on file  . Transportation needs:    Medical: Not on file    Non-medical: Not on file  Tobacco Use  . Smoking status: Never Smoker  . Smokeless tobacco: Never Used  Substance and Sexual Activity  . Alcohol use: Yes    Alcohol/week: 0.6 oz    Types: 1 Glasses of wine per week  . Drug use: No  . Sexual activity: Yes    Partners: Male  Lifestyle  . Physical activity:    Days per week: Not on file    Minutes per session: Not on file  . Stress: Not on file  Relationships  . Social connections:    Talks on phone: Not on file    Gets together: Not on file    Attends religious service: Not on file    Active member of club or organization: Not on file    Attends meetings of clubs or organizations: Not on file    Relationship status: Not on file  . Intimate partner violence:    Fear of current or ex partner: Not on file    Emotionally  abused: Not on file    Physically abused: Not on file    Forced sexual activity: Not on file  Other Topics Concern  . Not on file  Social History Narrative  . Not on file    Past Surgical History:  Procedure Laterality Date  . APPENDECTOMY  1954  . COLONOSCOPY    . LAPAROSCOPY  1982  . THYROIDECTOMY  2007  . TONSILLECTOMY  1949    Family History  Problem Relation Age of Onset  . Breast cancer Maternal Aunt 50    Allergies  Allergen Reactions  . Fluzone [Flu Virus Vaccine] Other (See Comments)    Lethargic, muscle aches, flat on back for 3 days  . Tetracyclines & Related     Stomach cramps    Current Outpatient Medications on File Prior to Visit  Medication Sig Dispense Refill  . albuterol (PROVENTIL HFA;VENTOLIN HFA) 108 (90 Base) MCG/ACT inhaler Inhale 2 puffs into the lungs every 6 (six) hours as needed for wheezing or shortness of breath. 1 Inhaler 3  . amLODipine (NORVASC) 5 MG tablet TAKE 1 TABLET BY MOUTH ONCE A DAY 90 tablet 3  . cholecalciferol (VITAMIN D) 1000 UNITS tablet Take  2,000 Units by mouth daily.    . hydrochlorothiazide (HYDRODIURIL) 25 MG tablet TAKE ONE TABLET BY MOUTH DAILY 90 tablet 0  . levothyroxine (SYNTHROID, LEVOTHROID) 125 MCG tablet TAKE 1 TABLET BY MOUTH ONCE A DAY 90 tablet 0  . pravastatin (PRAVACHOL) 20 MG tablet TAKE ONE TABLET BY MOUTH DAILY 90 tablet 2  . quinapril (ACCUPRIL) 40 MG tablet TAKE ONE TABLET BY MOUTH EVERY NIGHT AT BEDTIME 90 tablet 0  . vitamin C (ASCORBIC ACID) 500 MG tablet Take 500 mg by mouth daily.    Marland Kitchen escitalopram (LEXAPRO) 5 MG tablet TAKE ONE TABLET BY MOUTH DAILY (Patient not taking: Reported on 12/19/2017) 60 tablet 0   Current Facility-Administered Medications on File Prior to Visit  Medication Dose Route Frequency Provider Last Rate Last Dose  . ipratropium-albuterol (DUONEB) 0.5-2.5 (3) MG/3ML nebulizer solution 3 mL  3 mL Nebulization Q6H Nafziger, Cory, NP        BP 130/80 (BP Location: Right Arm, Patient  Position: Sitting, Cuff Size: Normal)   Pulse 85   Temp 98.4 F (36.9 C) (Oral)   Wt 165 lb (74.8 kg)   SpO2 97%   BMI 28.77 kg/m   Subsequent Medicare wellness visit  1. Risk factors, based on past  M,S,F history.  Cardiovascular risk factors include hypertension and dyslipidemia  2.  Physical activities: Remains quite active.  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  Has a dog that she walks  3.  Depression/mood: No history of major depression or mood disorder  4.  Hearing: Mild tinnitus but no hearing deficits  5.  ADL's: Independent  6.  Fall risk:  Low 7.  Home safety: No problems identified  8.  Height weight, and visual acuity; height and weight stable no change in visual acuity  9.  Counseling: Continue heart healthy diet and active lifestyle.  10. Lab orders based on risk factors: Laboratory update will be reviewed with special attention to lipid profile and TSH  11. Referral : Annual eye examination recommended  12. Care plan: Continue efforts at aggressive risk factor modification  13. Cognitive assessment: Alert and appropriate normal affect.  No cognitive dysfunction  14. Screening: Patient provided with a written and personalized 5-10 year screening schedule in the AVS.    15. Provider List Update: General surgery GI primary care radiology and ophthalmology    Review of Systems  Constitutional: Negative.   HENT: Positive for tinnitus. Negative for congestion, dental problem, hearing loss, rhinorrhea, sinus pressure and sore throat.   Eyes: Negative for pain, discharge and visual disturbance.  Respiratory: Negative for cough and shortness of breath.   Cardiovascular: Negative for chest pain, palpitations and leg swelling.  Gastrointestinal: Negative for abdominal distention, abdominal pain, blood in stool, constipation, diarrhea, nausea and vomiting.  Genitourinary: Negative for difficulty urinating, dysuria, flank pain, frequency,  hematuria, pelvic pain, urgency, vaginal bleeding, vaginal discharge and vaginal pain.  Musculoskeletal: Positive for arthralgias. Negative for gait problem and joint swelling.  Skin: Negative for rash.  Neurological: Negative for dizziness, syncope, speech difficulty, weakness, numbness and headaches.  Hematological: Negative for adenopathy.  Psychiatric/Behavioral: Positive for sleep disturbance. Negative for agitation, behavioral problems and dysphoric mood. The patient is not nervous/anxious.        Objective:   Physical Exam  Constitutional: She is oriented to person, place, and time. She appears well-developed and well-nourished.  Weight 165  HENT:  Head: Normocephalic and atraumatic.  Right Ear: External ear normal.  Left Ear: External ear normal.  Mouth/Throat: Oropharynx is clear and moist.  Eyes: Conjunctivae and EOM are normal.  Neck: Normal range of motion. Neck supple. No JVD present. No thyromegaly present.  Cardiovascular: Normal rate, regular rhythm, normal heart sounds and intact distal pulses.  No murmur heard. Pulmonary/Chest: Effort normal and breath sounds normal. She has no wheezes. She has no rales.  Abdominal: Soft. Bowel sounds are normal. She exhibits no distension and no mass. There is no tenderness. There is no rebound and no guarding.  Genitourinary: Vagina normal.  Musculoskeletal: Normal range of motion. She exhibits no edema or tenderness.  Neurological: She is alert and oriented to person, place, and time. She has normal reflexes. No cranial nerve deficit. She exhibits normal muscle tone. Coordination normal.  Skin: Skin is warm and dry. No rash noted.  Psychiatric: She has a normal mood and affect. Her behavior is normal.          Assessment & Plan:   Preventive health examination Subsequent Medicare wellness visit Essential hypertension stable Dyslipidemia.  Will continue statin therapy.  Will review lipid profile Hypothyroidism.  Will  review a TSH mild tinnitus.  Will observe  Insomnia.  Sleep hygiene issues discussed  Follow-up 6 months  Jacklin Zwick Pilar Plate

## 2017-12-19 NOTE — Patient Instructions (Addendum)
Limit your sodium (Salt) intake  Please check your blood pressure on a regular basis.  If it is consistently greater than 150/90, please make an office appointment.    It is important that you exercise regularly, at least 20 minutes 3 to 4 times per week.  If you develop chest pain or shortness of breath seek  medical attention.  Insomnia Insomnia is a sleep disorder that makes it difficult to fall asleep or to stay asleep. Insomnia can cause tiredness (fatigue), low energy, difficulty concentrating, mood swings, and poor performance at work or school. There are three different ways to classify insomnia:  Difficulty falling asleep.  Difficulty staying asleep.  Waking up too early in the morning.  Any type of insomnia can be long-term (chronic) or short-term (acute). Both are common. Short-term insomnia usually lasts for three months or less. Chronic insomnia occurs at least three times a week for longer than three months. What are the causes? Insomnia may be caused by another condition, situation, or substance, such as:  Anxiety.  Certain medicines.  Gastroesophageal reflux disease (GERD) or other gastrointestinal conditions.  Asthma or other breathing conditions.  Restless legs syndrome, sleep apnea, or other sleep disorders.  Chronic pain.  Menopause. This may include hot flashes.  Stroke.  Abuse of alcohol, tobacco, or illegal drugs.  Depression.  Caffeine.  Neurological disorders, such as Alzheimer disease.  An overactive thyroid (hyperthyroidism).  The cause of insomnia may not be known. What increases the risk? Risk factors for insomnia include:  Gender. Women are more commonly affected than men.  Age. Insomnia is more common as you get older.  Stress. This may involve your professional or personal life.  Income. Insomnia is more common in people with lower income.  Lack of exercise.  Irregular work schedule or night shifts.  Traveling between  different time zones.  What are the signs or symptoms? If you have insomnia, trouble falling asleep or trouble staying asleep is the main symptom. This may lead to other symptoms, such as:  Feeling fatigued.  Feeling nervous about going to sleep.  Not feeling rested in the morning.  Having trouble concentrating.  Feeling irritable, anxious, or depressed.  How is this treated? Treatment for insomnia depends on the cause. If your insomnia is caused by an underlying condition, treatment will focus on addressing the condition. Treatment may also include:  Medicines to help you sleep.  Counseling or therapy.  Lifestyle adjustments.  Follow these instructions at home:  Take medicines only as directed by your health care provider.  Keep regular sleeping and waking hours. Avoid naps.  Keep a sleep diary to help you and your health care provider figure out what could be causing your insomnia. Include: ? When you sleep. ? When you wake up during the night. ? How well you sleep. ? How rested you feel the next day. ? Any side effects of medicines you are taking. ? What you eat and drink.  Make your bedroom a comfortable place where it is easy to fall asleep: ? Put up shades or special blackout curtains to block light from outside. ? Use a Bondy noise machine to block noise. ? Keep the temperature cool.  Exercise regularly as directed by your health care provider. Avoid exercising right before bedtime.  Use relaxation techniques to manage stress. Ask your health care provider to suggest some techniques that may work well for you. These may include: ? Breathing exercises. ? Routines to release muscle tension. ? Visualizing  peaceful scenes.  Cut back on alcohol, caffeinated beverages, and cigarettes, especially close to bedtime. These can disrupt your sleep.  Do not overeat or eat spicy foods right before bedtime. This can lead to digestive discomfort that can make it hard for  you to sleep.  Limit screen use before bedtime. This includes: ? Watching TV. ? Using your smartphone, tablet, and computer.  Stick to a routine. This can help you fall asleep faster. Try to do a quiet activity, brush your teeth, and go to bed at the same time each night.  Get out of bed if you are still awake after 15 minutes of trying to sleep. Keep the lights down, but try reading or doing a quiet activity. When you feel sleepy, go back to bed.  Make sure that you drive carefully. Avoid driving if you feel very sleepy.  Keep all follow-up appointments as directed by your health care provider. This is important. Contact a health care provider if:  You are tired throughout the day or have trouble in your daily routine due to sleepiness.  You continue to have sleep problems or your sleep problems get worse. Get help right away if:  You have serious thoughts about hurting yourself or someone else. This information is not intended to replace advice given to you by your health care provider. Make sure you discuss any questions you have with your health care provider. Document Released: 08/27/2000 Document Revised: 01/30/2016 Document Reviewed: 05/31/2014 Elsevier Interactive Patient Education  [employer].

## 2017-12-21 ENCOUNTER — Other Ambulatory Visit: Payer: Self-pay | Admitting: *Deleted

## 2017-12-21 MED ORDER — LEVOTHYROXINE SODIUM 100 MCG PO TABS: 100.0000 ug | ORAL_TABLET | Freq: Every day | ORAL | 3 refills | Status: DC

## 2017-12-26 DIAGNOSIS — K642 Third degree hemorrhoids: Secondary | ICD-10-CM | POA: Diagnosis not present

## 2018-01-28 DIAGNOSIS — A499 Bacterial infection, unspecified: Secondary | ICD-10-CM | POA: Diagnosis not present

## 2018-01-28 DIAGNOSIS — N390 Urinary tract infection, site not specified: Secondary | ICD-10-CM | POA: Diagnosis not present

## 2018-02-10 ENCOUNTER — Other Ambulatory Visit (INDEPENDENT_AMBULATORY_CARE_PROVIDER_SITE_OTHER): Payer: Medicare HMO

## 2018-02-10 DIAGNOSIS — E039 Hypothyroidism, unspecified: Secondary | ICD-10-CM

## 2018-02-10 LAB — TSH: TSH: 0.06 u[IU]/mL — ABNORMAL LOW (ref 0.35–4.50)

## 2018-02-10 LAB — T4, FREE: FREE T4: 1.18 ng/dL (ref 0.60–1.60)

## 2018-02-13 MED ORDER — LEVOTHYROXINE SODIUM 75 MCG PO TABS: 75.0000 ug | ORAL_TABLET | Freq: Every day | ORAL | 0 refills | Status: DC

## 2018-03-10 ENCOUNTER — Other Ambulatory Visit: Payer: Self-pay | Admitting: Internal Medicine

## 2018-03-11 ENCOUNTER — Encounter: Payer: Self-pay | Admitting: Internal Medicine

## 2018-03-13 ENCOUNTER — Encounter: Payer: Self-pay | Admitting: Internal Medicine

## 2018-03-29 ENCOUNTER — Ambulatory Visit (INDEPENDENT_AMBULATORY_CARE_PROVIDER_SITE_OTHER): Payer: Medicare HMO | Admitting: Family Medicine

## 2018-03-29 ENCOUNTER — Encounter: Payer: Self-pay | Admitting: Family Medicine

## 2018-03-29 VITALS — BP 120/68 | HR 74 | Temp 98.0°F | Ht 63.5 in | Wt 171.2 lb

## 2018-03-29 DIAGNOSIS — N390 Urinary tract infection, site not specified: Secondary | ICD-10-CM | POA: Diagnosis not present

## 2018-03-29 DIAGNOSIS — R300 Dysuria: Secondary | ICD-10-CM | POA: Diagnosis not present

## 2018-03-29 LAB — POCT URINALYSIS DIPSTICK
Bilirubin, UA: NEGATIVE
Glucose, UA: NEGATIVE
KETONES UA: NEGATIVE
NITRITE UA: NEGATIVE
PH UA: 7.5 (ref 5.0–8.0)
PROTEIN UA: NEGATIVE
SPEC GRAV UA: 1.015 (ref 1.010–1.025)
Urobilinogen, UA: 0.2 E.U./dL

## 2018-03-29 MED ORDER — CIPROFLOXACIN HCL 500 MG PO TABS: 500.0000 mg | ORAL_TABLET | Freq: Two times a day (BID) | ORAL | 0 refills | Status: DC

## 2018-03-29 NOTE — Progress Notes (Signed)
   Subjective:    Patient ID: Teresa Rangel, female    DOB: Dec 28, 1940, 77 y.o.   MRN: [id number]  HPI Here for the onset yesterday of chills, nausea without vomiting, and urgency to urinate with burning. No back pain. She has frequent UTIs, and she was treated for one several months ago with Macrobid.    Review of Systems  Constitutional: Positive for chills. Negative for diaphoresis and fever.  Respiratory: Negative.   Cardiovascular: Negative.   Genitourinary: Positive for dysuria, frequency and urgency. Negative for flank pain.       Objective:   Physical Exam  Constitutional: She appears well-developed and well-nourished.  Cardiovascular: Normal rate, regular rhythm, normal heart sounds and intact distal pulses.  Pulmonary/Chest: Effort normal and breath sounds normal.  Abdominal: Soft. Bowel sounds are normal. She exhibits no distension and no mass. There is no tenderness. There is no rebound and no guarding. No hernia.          Assessment & Plan:  UTI, treat with Cipro. Culture the sample.  Alysia Penna, MD

## 2018-03-29 NOTE — Progress Notes (Signed)
ur

## 2018-03-30 ENCOUNTER — Telehealth: Payer: Self-pay

## 2018-03-30 DIAGNOSIS — M25512 Pain in left shoulder: Secondary | ICD-10-CM | POA: Diagnosis not present

## 2018-03-30 DIAGNOSIS — E039 Hypothyroidism, unspecified: Secondary | ICD-10-CM

## 2018-03-30 DIAGNOSIS — M7542 Impingement syndrome of left shoulder: Secondary | ICD-10-CM | POA: Diagnosis not present

## 2018-03-30 NOTE — Telephone Encounter (Signed)
Copied from [place] [id number]. Topic: Appointment Scheduling - Scheduling Inquiry for Clinic >> Mar 30, 2018 10:42 AM Synthia Innocent wrote: Reason for CRM: Requesting TSH lab order. Please advise

## 2018-03-30 NOTE — Telephone Encounter (Signed)
Per lab results of 02/10/18 TSH lab order has been placed. Called and left message for patient to schedule lab appointment.

## 2018-03-31 LAB — URINE CULTURE
MICRO NUMBER: 90848059
SPECIMEN QUALITY: ADEQUATE

## 2018-04-04 ENCOUNTER — Other Ambulatory Visit (INDEPENDENT_AMBULATORY_CARE_PROVIDER_SITE_OTHER): Payer: Medicare HMO

## 2018-04-04 DIAGNOSIS — E039 Hypothyroidism, unspecified: Secondary | ICD-10-CM

## 2018-04-04 LAB — TSH: TSH: 4.97 u[IU]/mL — ABNORMAL HIGH (ref 0.35–4.50)

## 2018-04-14 ENCOUNTER — Telehealth: Payer: Self-pay | Admitting: Internal Medicine

## 2018-04-14 NOTE — Telephone Encounter (Signed)
Incoming call from patient requesting treatment of results of TSH.  Patient wants to know if she should increase thyroid medicine or decrease or remain the same.

## 2018-04-14 NOTE — Telephone Encounter (Signed)
Please advise 

## 2018-04-17 ENCOUNTER — Other Ambulatory Visit: Payer: Self-pay | Admitting: Internal Medicine

## 2018-04-17 MED ORDER — LEVOTHYROXINE SODIUM 88 MCG PO TABS: 88.0000 ug | ORAL_TABLET | Freq: Every day | ORAL | 4 refills | Status: DC

## 2018-04-17 NOTE — Telephone Encounter (Signed)
Patient notified of results and verbalized understanding.  

## 2018-04-17 NOTE — Telephone Encounter (Signed)
Notify patient that her thyroid test is minimally low.  Okay to finish present supply of thyroid medication but next dose will be slightly stronger.  New prescription called in

## 2018-04-24 ENCOUNTER — Telehealth: Payer: Self-pay | Admitting: *Deleted

## 2018-04-24 ENCOUNTER — Encounter: Payer: Self-pay | Admitting: Internal Medicine

## 2018-04-24 ENCOUNTER — Ambulatory Visit (INDEPENDENT_AMBULATORY_CARE_PROVIDER_SITE_OTHER): Payer: Medicare HMO | Admitting: Internal Medicine

## 2018-04-24 VITALS — BP 154/86 | HR 81 | Temp 98.4°F | Wt 168.8 lb

## 2018-04-24 DIAGNOSIS — W57XXXA Bitten or stung by nonvenomous insect and other nonvenomous arthropods, initial encounter: Secondary | ICD-10-CM | POA: Diagnosis not present

## 2018-04-24 DIAGNOSIS — S80862A Insect bite (nonvenomous), left lower leg, initial encounter: Secondary | ICD-10-CM | POA: Diagnosis not present

## 2018-04-24 DIAGNOSIS — I10 Essential (primary) hypertension: Secondary | ICD-10-CM | POA: Diagnosis not present

## 2018-04-24 MED ORDER — AMLODIPINE BESYLATE 10 MG PO TABS: 10.0000 mg | ORAL_TABLET | Freq: Every day | ORAL | 3 refills | Status: DC

## 2018-04-24 NOTE — Patient Instructions (Addendum)
Limit your sodium (Salt) intake  Please check your blood pressure on a regular basis.  If it is consistently greater than 150/90, please make an office appointment.  Increase amlodipine to 10 mg daily  Return in 3 weeks for follow-up    It is important that you exercise regularly, at least 20 minutes 3 to 4 times per week.  If you develop chest pain or shortness of breath seek  medical attention.   DASH Eating Plan DASH stands for "Dietary Approaches to Stop Hypertension." The DASH eating plan is a healthy eating plan that has been shown to reduce high blood pressure (hypertension). It may also reduce your risk for type 2 diabetes, heart disease, and stroke. The DASH eating plan may also help with weight loss. What are tips for following this plan? General guidelines  Avoid eating more than 2,300 mg (milligrams) of salt (sodium) a day. If you have hypertension, you may need to reduce your sodium intake to 1,500 mg a day.  Limit alcohol intake to no more than 1 drink a day for nonpregnant women and 2 drinks a day for men. One drink equals 12 oz of beer, 5 oz of wine, or 1 oz of hard liquor.  Work with your health care provider to maintain a healthy body weight or to lose weight. Ask what an ideal weight is for you.  Get at least 30 minutes of exercise that causes your heart to beat faster (aerobic exercise) most days of the week. Activities may include walking, swimming, or biking.  Work with your health care provider or diet and nutrition specialist (dietitian) to adjust your eating plan to your individual calorie needs. Reading food labels  Check food labels for the amount of sodium per serving. Choose foods with less than 5 percent of the Daily Value of sodium. Generally, foods with less than 300 mg of sodium per serving fit into this eating plan.  To find whole grains, look for the word "whole" as the first word in the ingredient list. Shopping  Buy products labeled as  "low-sodium" or "no salt added."  Buy fresh foods. Avoid canned foods and premade or frozen meals. Cooking  Avoid adding salt when cooking. Use salt-free seasonings or herbs instead of table salt or sea salt. Check with your health care provider or pharmacist before using salt substitutes.  Do not fry foods. Cook foods using healthy methods such as baking, boiling, grilling, and broiling instead.  Cook with heart-healthy oils, such as olive, canola, soybean, or sunflower oil. Meal planning   Eat a balanced diet that includes: ? 5 or more servings of fruits and vegetables each day. At each meal, try to fill half of your plate with fruits and vegetables. ? Up to 6-8 servings of whole grains each day. ? Less than 6 oz of lean meat, poultry, or fish each day. A 3-oz serving of meat is about the same size as a deck of cards. One egg equals 1 oz. ? 2 servings of low-fat dairy each day. ? A serving of nuts, seeds, or beans 5 times each week. ? Heart-healthy fats. Healthy fats called Omega-3 fatty acids are found in foods such as flaxseeds and coldwater fish, like sardines, salmon, and mackerel.  Limit how much you eat of the following: ? Canned or prepackaged foods. ? Food that is high in trans fat, such as fried foods. ? Food that is high in saturated fat, such as fatty meat. ? Sweets, desserts, sugary drinks, and  other foods with added sugar. ? Full-fat dairy products.  Do not salt foods before eating.  Try to eat at least 2 vegetarian meals each week.  Eat more home-cooked food and less restaurant, buffet, and fast food.  When eating at a restaurant, ask that your food be prepared with less salt or no salt, if possible. What foods are recommended? The items listed may not be a complete list. Talk with your dietitian about what dietary choices are best for you. Grains Whole-grain or whole-wheat bread. Whole-grain or whole-wheat pasta. Brown rice. [place]. Bulgur. Whole-grain  and low-sodium cereals. Pita bread. Low-fat, low-sodium crackers. Whole-wheat flour tortillas. Vegetables Fresh or frozen vegetables (raw, steamed, roasted, or grilled). Low-sodium or reduced-sodium tomato and vegetable juice. Low-sodium or reduced-sodium tomato sauce and tomato paste. Low-sodium or reduced-sodium canned vegetables. Fruits All fresh, dried, or frozen fruit. Canned fruit in natural juice (without added sugar). Meat and other protein foods Skinless chicken or [place]. Ground chicken or [place]. Pork with fat trimmed off. Fish and seafood. Egg whites. Dried beans, peas, or lentils. Unsalted nuts, nut butters, and seeds. Unsalted canned beans. Lean cuts of beef with fat trimmed off. Low-sodium, lean deli meat. Dairy Low-fat (1%) or fat-free (skim) milk. Fat-free, low-fat, or reduced-fat cheeses. Nonfat, low-sodium ricotta or cottage cheese. Low-fat or nonfat yogurt. Low-fat, low-sodium cheese. Fats and oils Soft margarine without trans fats. Vegetable oil. Low-fat, reduced-fat, or light mayonnaise and salad dressings (reduced-sodium). Canola, safflower, olive, soybean, and sunflower oils. Avocado. Seasoning and other foods Herbs. Spices. Seasoning mixes without salt. Unsalted popcorn and pretzels. Fat-free sweets. What foods are not recommended? The items listed may not be a complete list. Talk with your dietitian about what dietary choices are best for you. Grains Baked goods made with fat, such as croissants, muffins, or some breads. Dry pasta or rice meal packs. Vegetables Creamed or fried vegetables. Vegetables in a cheese sauce. Regular canned vegetables (not low-sodium or reduced-sodium). Regular canned tomato sauce and paste (not low-sodium or reduced-sodium). Regular tomato and vegetable juice (not low-sodium or reduced-sodium). Angie Fava. Olives. Fruits Canned fruit in a light or heavy syrup. Fried fruit. Fruit in cream or butter sauce. Meat and other protein foods Fatty cuts  of meat. Ribs. Fried meat. Berniece Salines. Sausage. Bologna and other processed lunch meats. Salami. Fatback. Hotdogs. Bratwurst. Salted nuts and seeds. Canned beans with added salt. Canned or smoked fish. Whole eggs or egg yolks. Chicken or [place] with skin. Dairy Whole or 2% milk, cream, and half-and-half. Whole or full-fat cream cheese. Whole-fat or sweetened yogurt. Full-fat cheese. Nondairy creamers. Whipped toppings. Processed cheese and cheese spreads. Fats and oils Butter. Stick margarine. Lard. Shortening. Ghee. Bacon fat. Tropical oils, such as coconut, palm kernel, or palm oil. Seasoning and other foods Salted popcorn and pretzels. Onion salt, garlic salt, seasoned salt, table salt, and sea salt. Worcestershire sauce. Tartar sauce. Barbecue sauce. Teriyaki sauce. Soy sauce, including reduced-sodium. Steak sauce. Canned and packaged gravies. Fish sauce. Oyster sauce. Cocktail sauce. Horseradish that you find on the shelf. Ketchup. Mustard. Meat flavorings and tenderizers. Bouillon cubes. Hot sauce and Tabasco sauce. Premade or packaged marinades. Premade or packaged taco seasonings. Relishes. Regular salad dressings. Where to find more information:  National Heart, Lung, and [place]: https://wilson-eaton.com/  American Heart Association: www.heart.org Summary  The DASH eating plan is a healthy eating plan that has been shown to reduce high blood pressure (hypertension). It may also reduce your risk for type 2 diabetes, heart disease, and stroke.  With the DASH eating plan, you should limit salt (sodium) intake to 2,300 mg a day. If you have hypertension, you may need to reduce your sodium intake to 1,500 mg a day.  When on the DASH eating plan, aim to eat more fresh fruits and vegetables, whole grains, lean proteins, low-fat dairy, and heart-healthy fats.  Work with your health care provider or diet and nutrition specialist (dietitian) to adjust your eating plan to your individual calorie  needs. This information is not intended to replace advice given to you by your health care provider. Make sure you discuss any questions you have with your health care provider. Document Released: 08/19/2011 Document Revised: 08/23/2016 Document Reviewed: 08/23/2016 Elsevier Interactive Patient Education  [employer].

## 2018-04-24 NOTE — Progress Notes (Signed)
Subjective:    Patient ID: Teresa Rangel, female    DOB: December 02, 1940, 77 y.o.   MRN: [id number]  HPI 77 year old patient who has a history of essential hypertension. She presented today as a walk-in concerned about a insect bite to the posterior leg area.  She was concerned about a possible tick bite.  She initially was seen by the nurse and complained of some dizziness and noted to be hypertensive.  She was seen today as a work in. She has been compliant with her medications.  She states for the past 2 weeks blood pressure has frequently been elevated.  She describes intermittent dizziness and no history of hypotension.  She remains on triple therapy  Past Medical History:  Diagnosis Date  . History of stomach ulcers 1980  . Hyperlipidemia   . Hypertension   . Hypothyroidism      Social History   Socioeconomic History  . Marital status: Married    Spouse name: Not on file  . Number of children: Not on file  . Years of education: Not on file  . Highest education level: Not on file  Occupational History  . Not on file  Social Needs  . Financial resource strain: Not on file  . Food insecurity:    Worry: Not on file    Inability: Not on file  . Transportation needs:    Medical: Not on file    Non-medical: Not on file  Tobacco Use  . Smoking status: Never Smoker  . Smokeless tobacco: Never Used  Substance and Sexual Activity  . Alcohol use: Yes    Alcohol/week: 1.0 standard drinks    Types: 1 Glasses of wine per week  . Drug use: No  . Sexual activity: Yes    Partners: Male  Lifestyle  . Physical activity:    Days per week: Not on file    Minutes per session: Not on file  . Stress: Not on file  Relationships  . Social connections:    Talks on phone: Not on file    Gets together: Not on file    Attends religious service: Not on file    Active member of club or organization: Not on file    Attends meetings of clubs or organizations: Not on file    Relationship  status: Not on file  . Intimate partner violence:    Fear of current or ex partner: Not on file    Emotionally abused: Not on file    Physically abused: Not on file    Forced sexual activity: Not on file  Other Topics Concern  . Not on file  Social History Narrative  . Not on file    Past Surgical History:  Procedure Laterality Date  . APPENDECTOMY  1954  . COLONOSCOPY    . LAPAROSCOPY  1982  . THYROIDECTOMY  2007  . TONSILLECTOMY  1949    Family History  Problem Relation Age of Onset  . Breast cancer Maternal Aunt 50    Allergies  Allergen Reactions  . Fluzone [Flu Virus Vaccine] Other (See Comments)    Lethargic, muscle aches, flat on back for 3 days  . Ciprofloxacin     Yeast infection. Patient prefers not to take again.  . Tetracyclines & Related     Stomach cramps    Current Outpatient Medications on File Prior to Visit  Medication Sig Dispense Refill  . albuterol (PROVENTIL HFA;VENTOLIN HFA) 108 (90 Base) MCG/ACT inhaler Inhale 2 puffs into  the lungs every 6 (six) hours as needed for wheezing or shortness of breath. 1 Inhaler 3  . cholecalciferol (VITAMIN D) 1000 UNITS tablet Take 2,000 Units by mouth daily.    Marland Kitchen escitalopram (LEXAPRO) 5 MG tablet TAKE ONE TABLET BY MOUTH DAILY 60 tablet 0  . hydrochlorothiazide (HYDRODIURIL) 25 MG tablet TAKE ONE TABLET BY MOUTH DAILY 90 tablet 0  . levothyroxine (SYNTHROID, LEVOTHROID) 88 MCG tablet Take 1 tablet (88 mcg total) by mouth daily. 90 tablet 4  . pravastatin (PRAVACHOL) 20 MG tablet TAKE ONE TABLET BY MOUTH DAILY 90 tablet 2  . quinapril (ACCUPRIL) 40 MG tablet TAKE ONE TABLET BY MOUTH EVERY NIGHT AT BEDTIME 90 tablet 0  . vitamin C (ASCORBIC ACID) 500 MG tablet Take 500 mg by mouth daily.     Current Facility-Administered Medications on File Prior to Visit  Medication Dose Route Frequency Provider Last Rate Last Dose  . ipratropium-albuterol (DUONEB) 0.5-2.5 (3) MG/3ML nebulizer solution 3 mL  3 mL Nebulization  Q6H Nafziger, Cory, NP        BP (!) 154/86 (BP Location: Left Arm, Patient Position: Sitting, Cuff Size: Normal)   Pulse 81   Temp 98.4 F (36.9 C) (Oral)   Wt 168 lb 12.8 oz (76.6 kg)   SpO2 98%   BMI 29.43 kg/m      Review of Systems  Constitutional: Negative.   HENT: Negative for congestion, dental problem, hearing loss, rhinorrhea, sinus pressure, sore throat and tinnitus.   Eyes: Negative for pain, discharge and visual disturbance.  Respiratory: Negative for cough and shortness of breath.   Cardiovascular: Negative for chest pain, palpitations and leg swelling.  Gastrointestinal: Negative for abdominal distention, abdominal pain, blood in stool, constipation, diarrhea, nausea and vomiting.  Genitourinary: Negative for difficulty urinating, dysuria, flank pain, frequency, hematuria, pelvic pain, urgency, vaginal bleeding, vaginal discharge and vaginal pain.  Musculoskeletal: Negative for arthralgias, gait problem and joint swelling.  Skin: Positive for rash.  Neurological: Positive for dizziness and light-headedness. Negative for syncope, speech difficulty, weakness, numbness and headaches.  Hematological: Negative for adenopathy.  Psychiatric/Behavioral: Negative for agitation, behavioral problems and dysphoric mood. The patient is not nervous/anxious.        Objective:   Physical Exam  Constitutional: She appears well-developed and well-nourished. No distress.  Blood pressure 175/92  Skin:  1 cm erythematous annular lesion posterior left leg with tiny central eschar          Assessment & Plan:   Insect bite left leg with mild local inflammatory reaction Hypertension suboptimal control.  Patient states that she has been off her diuretic for a few days.  Compliance stressed.  Will increase amlodipine to 10 mg daily  Follow-up 3 weeks DASH diet  Teresa Rangel

## 2018-04-24 NOTE — Telephone Encounter (Signed)
Patient walked into office c/o possible tick bite to L leg. Thinks this happened on Friday or Saturday. She denies fever and fatigue. She also notes lightheadedness since today and elevated  BP at home.   1-2 cm red circular lesion w/ excoriated center noted on posterior L thigh. No insect noted. Surround skin intact and non-erythematous.  Scheduled same day appt for patient to address HTN/lightheadedness.

## 2018-04-27 IMAGING — MG DIGITAL SCREENING BILATERAL MAMMOGRAM WITH TOMO AND CAD
8 series · 8 of 24 positions shown · non-contrast
Comparison: Previous exam(s).

ACR Breast Density Category a: The breast tissue is almost entirely
fatty.

CLINICAL DATA: Screening.

EXAM:
DIGITAL SCREENING BILATERAL MAMMOGRAM WITH TOMO AND CAD

[L CC synth-2D]
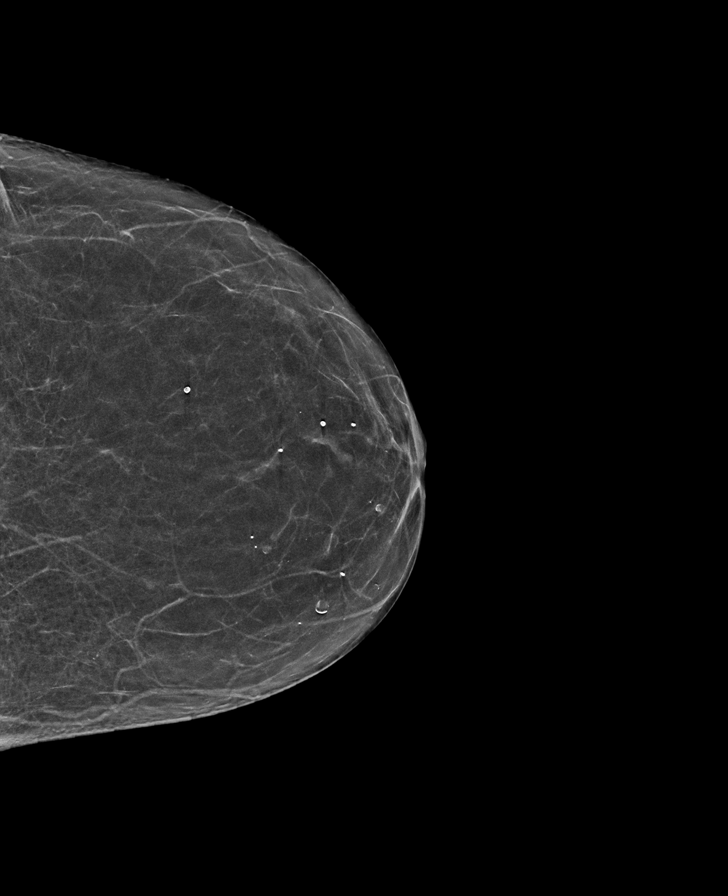

[R CC synth-2D]
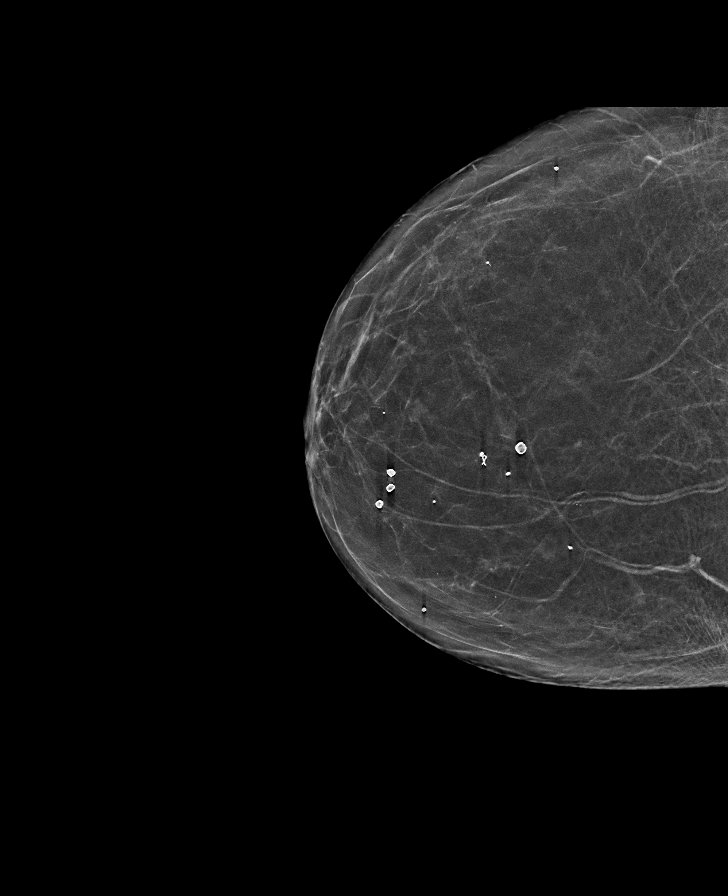

[R MLO synth-2D]
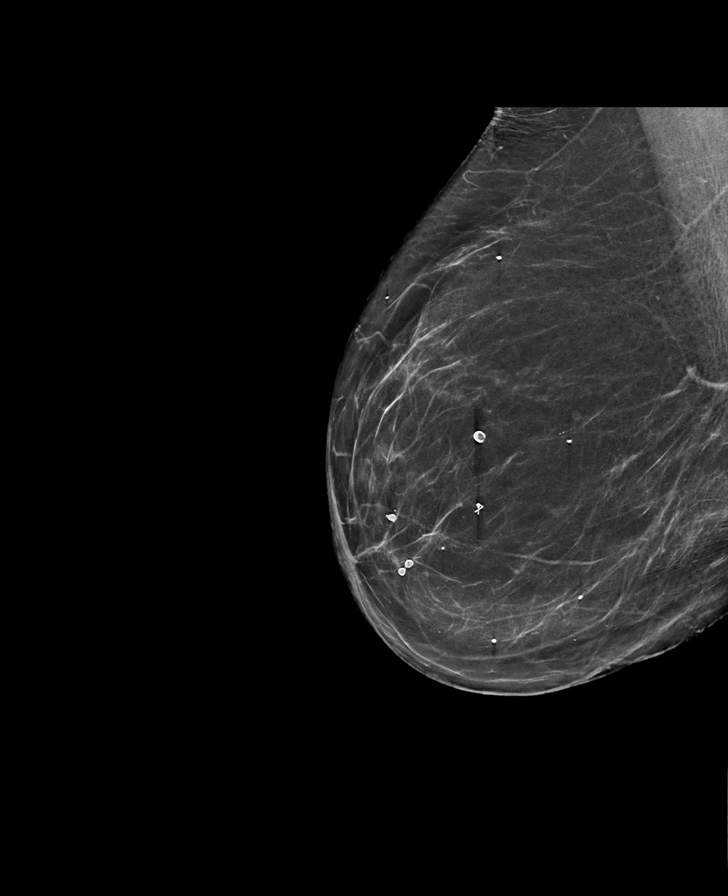

[L MLO synth-2D]
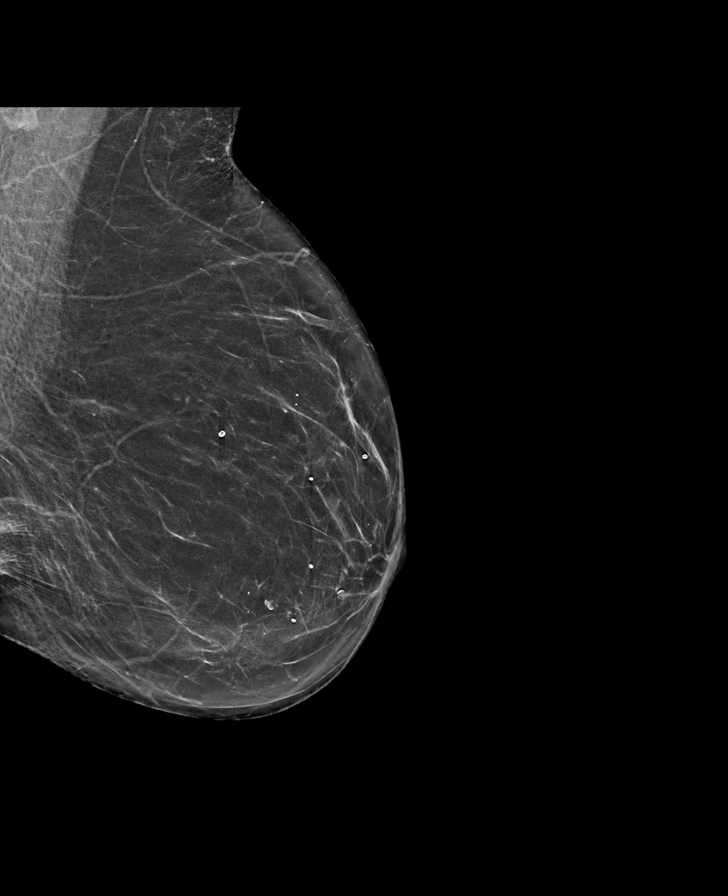

[R CC tomo · tomo slice 29/58.0]
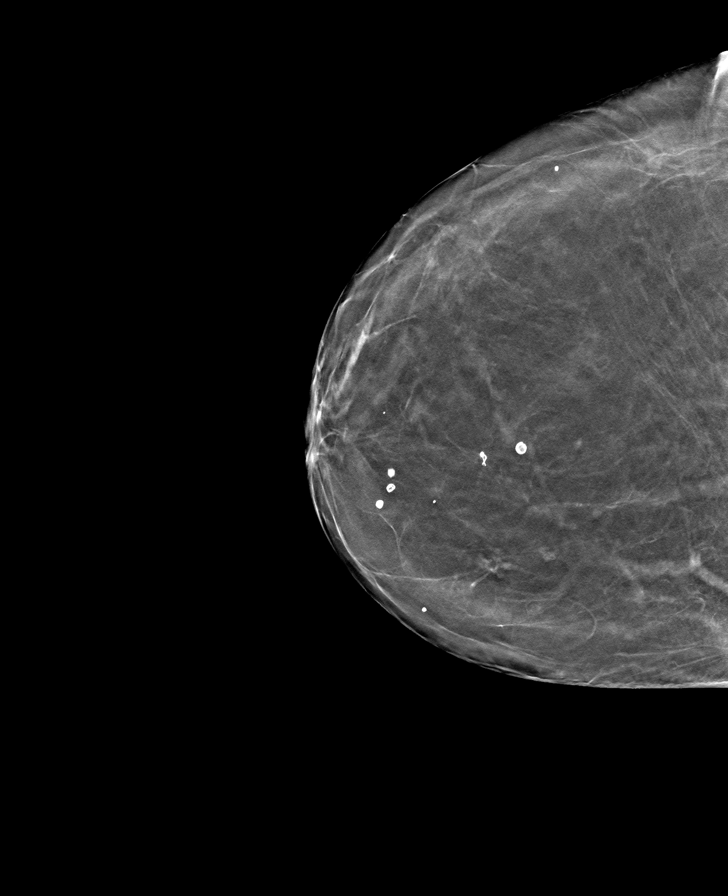

[L CC tomo · tomo slice 29/57.0]
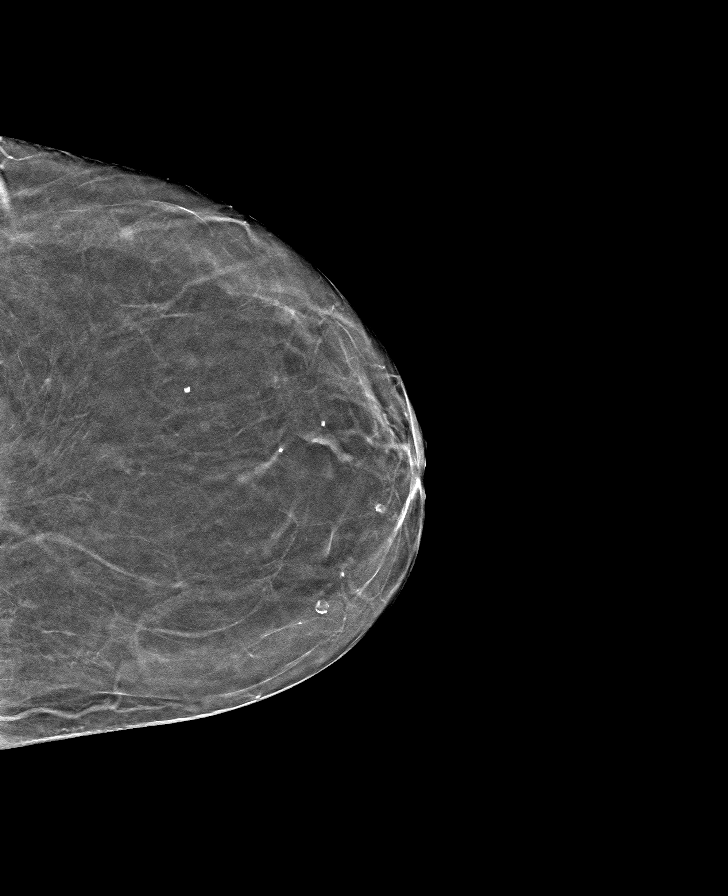

[L MLO tomo · tomo slice 33/65.0]
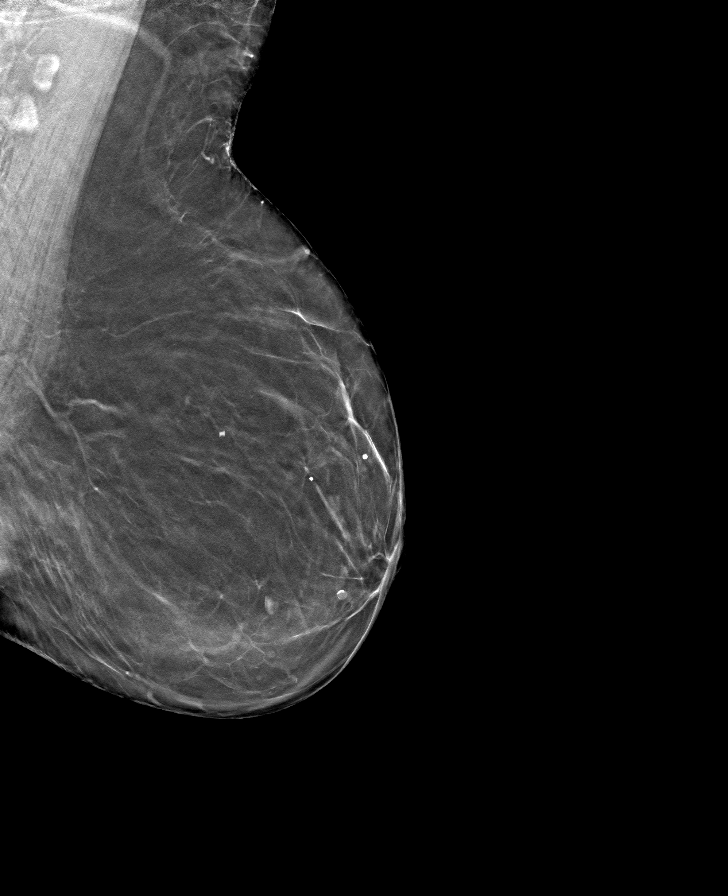

[R MLO tomo · tomo slice 34/67.0]
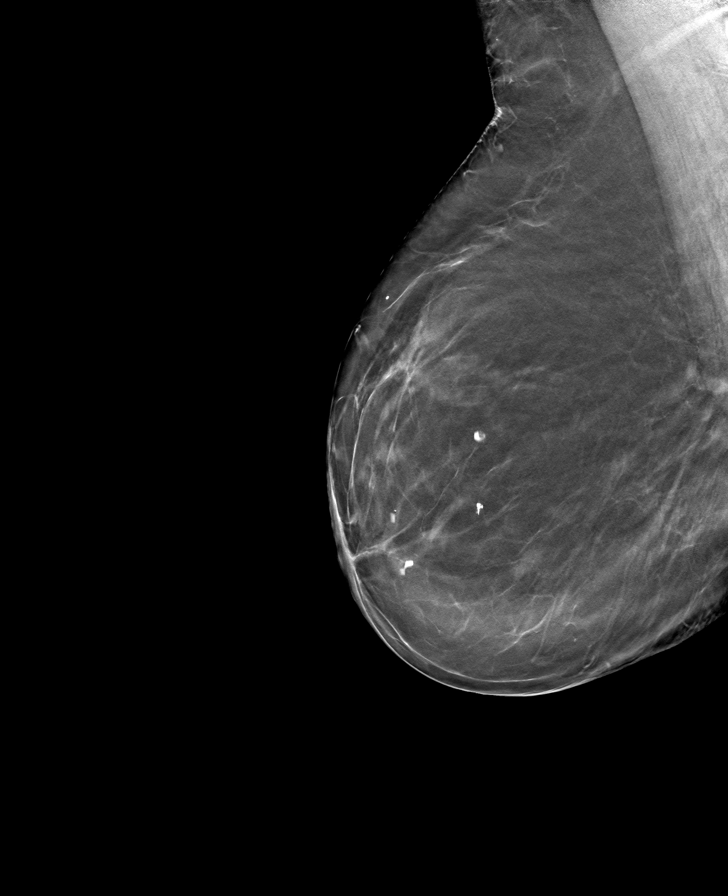

[8 of 24 positions shown; findings below may reference images not displayed]

FINDINGS: There are no findings suspicious for malignancy. Images were
processed with CAD.
IMPRESSION: No mammographic evidence of malignancy. A result letter of this
screening mammogram will be mailed directly to the patient.

RECOMMENDATION:
Screening mammogram in one year. (Code:[id number]-[id number])

BI-RADS CATEGORY  1: Negative.

## 2018-05-01 ENCOUNTER — Telehealth: Payer: Self-pay | Admitting: Podiatry

## 2018-05-01 NOTE — Telephone Encounter (Signed)
Patient wants to talk to the nurse about her foot she fell on the surgery foot(right foot) DOS 02/2016. Wants to make sure shes okay

## 2018-05-01 NOTE — Telephone Encounter (Signed)
Pt states she was on a trip 04/22/2018, and had to change sides of the bed because the air conditioning was blowing, and she rolled her usual way and fell off the bed, slamming both big toes. Pt states at 1st they were just blue, but she took at walk this weekend and now the surgical toe is bruised and swollen. I told pt she should be seen and I would have a scheduler call her.

## 2018-05-01 NOTE — Telephone Encounter (Signed)
Called pt back to schedule appt.

## 2018-05-02 ENCOUNTER — Ambulatory Visit (INDEPENDENT_AMBULATORY_CARE_PROVIDER_SITE_OTHER): Payer: Medicare HMO

## 2018-05-02 ENCOUNTER — Encounter: Payer: Self-pay | Admitting: Podiatry

## 2018-05-02 ENCOUNTER — Ambulatory Visit: Payer: Medicare HMO | Admitting: Podiatry

## 2018-05-02 DIAGNOSIS — S9031XA Contusion of right foot, initial encounter: Secondary | ICD-10-CM | POA: Diagnosis not present

## 2018-05-02 DIAGNOSIS — S92504A Nondisplaced unspecified fracture of right lesser toe(s), initial encounter for closed fracture: Secondary | ICD-10-CM | POA: Diagnosis not present

## 2018-05-02 NOTE — Progress Notes (Signed)
She presents today date of surgery 02/14/2015 status post Liane Comber bunionectomy metatarsal osteotomy second tarsometatarsal fusion of the second stating that she fell off the bed while on vacation and hurt the top of both feet however the right foot which had surgery previously has been painful she states that the second toe appears to be a little bit crooked.  Objective: Vital signs are stable alert and oriented x3.  Pulses are palpable.  Neurologic sensorium is intact.  DP reflexes are intact.  Muscle strength +5/5 dorsiflexors plantar flexors inverters everters all intrinsic musculature is intact.  She has some tenderness on palpation of the second metatarsal head right foot.  Some ecchymosis is noted.  Base of the second toe is tender on palpation.  Radiographs demonstrate what appears to be a linear nondisplaced fracture of the proximal phalanx of the second digit right.  Assessment: Linear nondisplaced fracture of the proximal second phalanx as well as a possible second metatarsal hairline fracture.  Plan: At this point I placed her in a Darco shoe and will follow up with her in about 4 weeks.

## 2018-05-10 ENCOUNTER — Other Ambulatory Visit: Payer: Self-pay | Admitting: Internal Medicine

## 2018-05-16 ENCOUNTER — Encounter: Payer: Self-pay | Admitting: Internal Medicine

## 2018-05-16 ENCOUNTER — Ambulatory Visit (INDEPENDENT_AMBULATORY_CARE_PROVIDER_SITE_OTHER): Payer: Medicare HMO | Admitting: Internal Medicine

## 2018-05-16 VITALS — BP 102/60 | HR 66 | Temp 98.8°F | Wt 168.8 lb

## 2018-05-16 DIAGNOSIS — I10 Essential (primary) hypertension: Secondary | ICD-10-CM | POA: Diagnosis not present

## 2018-05-16 MED ORDER — AMLODIPINE BESYLATE 5 MG PO TABS: 5.0000 mg | ORAL_TABLET | Freq: Every day | ORAL | 3 refills | Status: DC

## 2018-05-16 MED ORDER — QUINAPRIL HCL 40 MG PO TABS: 40.0000 mg | ORAL_TABLET | Freq: Every day | ORAL | 4 refills | Status: DC

## 2018-05-16 MED ORDER — ESCITALOPRAM OXALATE 5 MG PO TABS: 5.0000 mg | ORAL_TABLET | Freq: Every day | ORAL | 3 refills | Status: DC

## 2018-05-16 MED ORDER — PRAVASTATIN SODIUM 20 MG PO TABS: 20.0000 mg | ORAL_TABLET | Freq: Every day | ORAL | 4 refills | Status: DC

## 2018-05-16 MED ORDER — LEVOTHYROXINE SODIUM 88 MCG PO TABS: 88.0000 ug | ORAL_TABLET | Freq: Every day | ORAL | 4 refills | Status: DC

## 2018-05-16 MED ORDER — HYDROCHLOROTHIAZIDE 25 MG PO TABS: 25.0000 mg | ORAL_TABLET | Freq: Every day | ORAL | 4 refills | Status: DC

## 2018-05-16 NOTE — Patient Instructions (Signed)
Limit your sodium (Salt) intake  Please check your blood pressure on a regular basis.  If it is consistently greater than 140/90, please make an office appointment.  Return in 6 months for follow-up

## 2018-05-16 NOTE — Progress Notes (Signed)
Subjective:    Patient ID: Teresa Rangel, female    DOB: October 05, 1940, 77 y.o.   MRN: [id number]  HPI 77 year old patient who is seen today for follow-up of hypertension.  When seen last visit her blood pressure was elevated.  She had been temporarily off diuretic therapy. Amlodipine was increased to 10 mg daily and hydrochlorothiazide was resumed. She was placed on a DASH diet which the patient has adhered to faithfully.  She has been monitoring home blood pressure readings which have trended down.  She resumed amlodipine 5 mg after some low blood pressure readings associated with some dizziness  Past Medical History:  Diagnosis Date  . History of stomach ulcers 1980  . Hyperlipidemia   . Hypertension   . Hypothyroidism      Social History   Socioeconomic History  . Marital status: Married    Spouse name: Not on file  . Number of children: Not on file  . Years of education: Not on file  . Highest education level: Not on file  Occupational History  . Not on file  Social Needs  . Financial resource strain: Not on file  . Food insecurity:    Worry: Not on file    Inability: Not on file  . Transportation needs:    Medical: Not on file    Non-medical: Not on file  Tobacco Use  . Smoking status: Never Smoker  . Smokeless tobacco: Never Used  Substance and Sexual Activity  . Alcohol use: Yes    Alcohol/week: 1.0 standard drinks    Types: 1 Glasses of wine per week  . Drug use: No  . Sexual activity: Yes    Partners: Male  Lifestyle  . Physical activity:    Days per week: Not on file    Minutes per session: Not on file  . Stress: Not on file  Relationships  . Social connections:    Talks on phone: Not on file    Gets together: Not on file    Attends religious service: Not on file    Active member of club or organization: Not on file    Attends meetings of clubs or organizations: Not on file    Relationship status: Not on file  . Intimate partner violence:   Fear of current or ex partner: Not on file    Emotionally abused: Not on file    Physically abused: Not on file    Forced sexual activity: Not on file  Other Topics Concern  . Not on file  Social History Narrative  . Not on file    Past Surgical History:  Procedure Laterality Date  . APPENDECTOMY  1954  . COLONOSCOPY    . LAPAROSCOPY  1982  . THYROIDECTOMY  2007  . TONSILLECTOMY  1949    Family History  Problem Relation Age of Onset  . Breast cancer Maternal Aunt 50    Allergies  Allergen Reactions  . Fluzone [Flu Virus Vaccine] Other (See Comments)    Lethargic, muscle aches, flat on back for 3 days  . Ciprofloxacin     Yeast infection. Patient prefers not to take again.  . Tetracyclines & Related     Stomach cramps    Current Outpatient Medications on File Prior to Visit  Medication Sig Dispense Refill  . albuterol (PROVENTIL HFA;VENTOLIN HFA) 108 (90 Base) MCG/ACT inhaler Inhale 2 puffs into the lungs every 6 (six) hours as needed for wheezing or shortness of breath. 1 Inhaler 3  .  cholecalciferol (VITAMIN D) 1000 UNITS tablet Take 2,000 Units by mouth daily.    . hydrochlorothiazide (HYDRODIURIL) 25 MG tablet TAKE ONE TABLET BY MOUTH DAILY 90 tablet 0  . levothyroxine (SYNTHROID, LEVOTHROID) 88 MCG tablet Take 1 tablet (88 mcg total) by mouth daily. 90 tablet 4  . pravastatin (PRAVACHOL) 20 MG tablet TAKE ONE TABLET BY MOUTH DAILY 90 tablet 2  . quinapril (ACCUPRIL) 40 MG tablet TAKE ONE TABLET BY MOUTH EVERY NIGHT AT BEDTIME 90 tablet 0  . vitamin C (ASCORBIC ACID) 500 MG tablet Take 500 mg by mouth daily.     No current facility-administered medications on file prior to visit.     BP 102/60 (BP Location: Right Arm, Patient Position: Sitting, Cuff Size: Large)   Pulse 66   Temp 98.8 F (37.1 C) (Oral)   Wt 168 lb 12.8 oz (76.6 kg)   SpO2 97%   BMI 29.43 kg/m      Review of Systems  Constitutional: Negative.   HENT: Negative for congestion, dental  problem, hearing loss, rhinorrhea, sinus pressure, sore throat and tinnitus.   Eyes: Negative for pain, discharge and visual disturbance.  Respiratory: Negative for cough and shortness of breath.   Cardiovascular: Negative for chest pain, palpitations and leg swelling.  Gastrointestinal: Negative for abdominal distention, abdominal pain, blood in stool, constipation, diarrhea, nausea and vomiting.  Genitourinary: Negative for difficulty urinating, dysuria, flank pain, frequency, hematuria, pelvic pain, urgency, vaginal bleeding, vaginal discharge and vaginal pain.  Musculoskeletal: Negative for arthralgias, gait problem and joint swelling.  Skin: Negative for rash.  Neurological: Positive for dizziness and light-headedness. Negative for syncope, speech difficulty, weakness, numbness and headaches.  Hematological: Negative for adenopathy.  Psychiatric/Behavioral: Negative for agitation, behavioral problems and dysphoric mood. The patient is not nervous/anxious.        Objective:   Physical Exam  Constitutional: She appears well-developed and well-nourished. No distress.  Blood pressure 110/70  Cardiovascular: Normal rate and regular rhythm.  Pulmonary/Chest: Effort normal and breath sounds normal.  Musculoskeletal: She exhibits no edema.          Assessment & Plan:   Essential hypertension well-controlled.  Will continue amlodipine 5 mg daily and low-salt diet Continue home blood pressure monitoring Follow-up 6 months  Marletta Lor

## 2018-05-23 ENCOUNTER — Ambulatory Visit: Payer: Medicare HMO | Admitting: Podiatry

## 2018-06-07 ENCOUNTER — Other Ambulatory Visit: Payer: Self-pay | Admitting: Internal Medicine

## 2018-06-07 NOTE — Telephone Encounter (Signed)
Pt aware to follow up with new PCP for further refills due to Dr.Kwiakowski's retiring.  

## 2018-06-08 ENCOUNTER — Ambulatory Visit: Payer: Medicare HMO | Admitting: Podiatry

## 2018-07-04 ENCOUNTER — Emergency Department (HOSPITAL_COMMUNITY)
Admission: EM | Admit: 2018-07-04 | Discharge: 2018-07-05 | Disposition: A | Discharge status: Home / Self Care | Payer: Medicare HMO | Attending: Emergency Medicine | Admitting: Emergency Medicine

## 2018-07-04 ENCOUNTER — Encounter (HOSPITAL_COMMUNITY): Payer: Self-pay | Admitting: Emergency Medicine

## 2018-07-04 ENCOUNTER — Emergency Department (HOSPITAL_COMMUNITY): Payer: Medicare HMO

## 2018-07-04 ENCOUNTER — Other Ambulatory Visit: Payer: Self-pay

## 2018-07-04 DIAGNOSIS — I10 Essential (primary) hypertension: Secondary | ICD-10-CM

## 2018-07-04 DIAGNOSIS — Z79899 Other long term (current) drug therapy: Secondary | ICD-10-CM | POA: Diagnosis not present

## 2018-07-04 DIAGNOSIS — E039 Hypothyroidism, unspecified: Secondary | ICD-10-CM | POA: Diagnosis not present

## 2018-07-04 DIAGNOSIS — R0602 Shortness of breath: Secondary | ICD-10-CM | POA: Diagnosis not present

## 2018-07-04 DIAGNOSIS — R51 Headache: Secondary | ICD-10-CM | POA: Diagnosis not present

## 2018-07-04 NOTE — ED Triage Notes (Signed)
Pt presents with HTN and headache that started tonight. Hx HTN. Denies chest pain/blurred vision, reports mild shortness of breath. A&O x 4, ambulatory.

## 2018-07-05 ENCOUNTER — Emergency Department (HOSPITAL_COMMUNITY): Payer: Medicare HMO

## 2018-07-05 DIAGNOSIS — G43909 Migraine, unspecified, not intractable, without status migrainosus: Secondary | ICD-10-CM | POA: Diagnosis not present

## 2018-07-05 DIAGNOSIS — E039 Hypothyroidism, unspecified: Secondary | ICD-10-CM | POA: Diagnosis not present

## 2018-07-05 DIAGNOSIS — Z79899 Other long term (current) drug therapy: Secondary | ICD-10-CM | POA: Diagnosis not present

## 2018-07-05 DIAGNOSIS — I10 Essential (primary) hypertension: Secondary | ICD-10-CM | POA: Diagnosis not present

## 2018-07-05 DIAGNOSIS — R51 Headache: Secondary | ICD-10-CM | POA: Diagnosis not present

## 2018-07-05 LAB — CBC WITH DIFFERENTIAL/PLATELET
Abs Immature Granulocytes: 0.02 10*3/uL (ref 0.00–0.07)
BASOS ABS: 0 10*3/uL (ref 0.0–0.1)
BASOS PCT: 1 %
EOS ABS: 0.4 10*3/uL (ref 0.0–0.5)
Eosinophils Relative: 6 %
HEMATOCRIT: 39.9 % (ref 36.0–46.0)
Hemoglobin: 13.5 g/dL (ref 12.0–15.0)
Immature Granulocytes: 0 %
LYMPHS ABS: 2.3 10*3/uL (ref 0.7–4.0)
Lymphocytes Relative: 35 %
MCH: 28.7 pg (ref 26.0–34.0)
MCHC: 33.8 g/dL (ref 30.0–36.0)
MCV: 84.9 fL (ref 80.0–100.0)
Monocytes Absolute: 0.7 10*3/uL (ref 0.1–1.0)
Monocytes Relative: 11 %
NRBC: 0 % (ref 0.0–0.2)
Neutro Abs: 3.1 10*3/uL (ref 1.7–7.7)
Neutrophils Relative %: 47 %
PLATELETS: 207 10*3/uL (ref 150–400)
RBC: 4.7 MIL/uL (ref 3.87–5.11)
RDW: 12.6 % (ref 11.5–15.5)
WBC: 6.6 10*3/uL (ref 4.0–10.5)

## 2018-07-05 LAB — I-STAT TROPONIN, ED: Troponin i, poc: 0 ng/mL (ref 0.00–0.08)

## 2018-07-05 LAB — BASIC METABOLIC PANEL
Anion gap: 8 (ref 5–15)
BUN: 11 mg/dL (ref 8–23)
CALCIUM: 9.1 mg/dL (ref 8.9–10.3)
CO2: 25 mmol/L (ref 22–32)
CREATININE: 0.71 mg/dL (ref 0.44–1.00)
Chloride: 108 mmol/L (ref 98–111)
GFR calc non Af Amer: >60 mL/min (ref >60)
Glucose, Bld: 106 mg/dL — ABNORMAL HIGH (ref 70–99)
Potassium: 3.1 mmol/L — ABNORMAL LOW (ref 3.5–5.1)
Sodium: 141 mmol/L (ref 135–145)

## 2018-07-05 NOTE — ED Provider Notes (Signed)
TIME SEEN: 12:12 AM  CHIEF COMPLAINT: Hypertension, headache, shortness of breath  HPI: Patient is a 77 year old female with history of hypertension, hyperlipidemia, hypothyroidism who presents to the emergency department with severe "crushing" headache that started at 8 PM tonight.  States she took her blood pressure at home when she started having a headache it was 183/95.  She then took her Accupril 40 mg and states her blood pressure improved.  Took 2 extra strength Tylenol and headache has improved but is not gone.  Has had similar headaches when her blood pressure has been elevated.  She does state that a month ago she felt like she saw lightening bolts in both eyes into the night before her headache she had a black spot in the lower part of her visual field from her right eye.  Vision is now back to normal.  She states she did feel short of breath as well with this headache but no chest pain or chest discomfort.  No numbness or focal weakness.  Has been compliant with her Accupril, Norvasc and HCTZ.  Reports she checks her blood pressure normally every day and is normally in the 130/1 80s.  ROS: See HPI Constitutional: no fever  Eyes: no drainage  ENT: no runny nose   Cardiovascular:  no chest pain  Resp: no SOB  GI: no vomiting GU: no dysuria Integumentary: no rash  Allergy: no hives  Musculoskeletal: no leg swelling  Neurological: no slurred speech ROS otherwise negative  PAST MEDICAL HISTORY/PAST SURGICAL HISTORY:  Past Medical History:  Diagnosis Date  . History of stomach ulcers 1980  . Hyperlipidemia   . Hypertension   . Hypothyroidism     MEDICATIONS:  Prior to Admission medications   Medication Sig Start Date End Date Taking? Authorizing Provider  albuterol (PROVENTIL HFA;VENTOLIN HFA) 108 (90 Base) MCG/ACT inhaler Inhale 2 puffs into the lungs every 6 (six) hours as needed for wheezing or shortness of breath. 07/14/16   Delano Metz, FNP  amLODipine (NORVASC) 5 MG  tablet Take 1 tablet (5 mg total) by mouth daily. 05/16/18   Marletta Lor, MD  cholecalciferol (VITAMIN D) 1000 UNITS tablet Take 2,000 Units by mouth daily.    [provider]  escitalopram (LEXAPRO) 5 MG tablet Take 1 tablet (5 mg total) by mouth daily. 05/16/18   Marletta Lor, MD  hydrochlorothiazide (HYDRODIURIL) 25 MG tablet TAKE ONE TABLET BY MOUTH DAILY 06/07/18   Dorena Cookey, MD  levothyroxine (SYNTHROID, LEVOTHROID) 88 MCG tablet Take 1 tablet (88 mcg total) by mouth daily. 05/16/18   Marletta Lor, MD  pravastatin (PRAVACHOL) 20 MG tablet Take 1 tablet (20 mg total) by mouth daily. 05/16/18   Marletta Lor, MD  quinapril (ACCUPRIL) 40 MG tablet TAKE ONE TABLET BY MOUTH EVERY NIGHT AT BEDTIME 06/07/18   Dorena Cookey, MD  vitamin C (ASCORBIC ACID) 500 MG tablet Take 500 mg by mouth daily.    [provider]    ALLERGIES:  Allergies  Allergen Reactions  . Fluzone [Flu Virus Vaccine] Other (See Comments)    Lethargic, muscle aches, flat on back for 3 days  . Ciprofloxacin     Yeast infection. Patient prefers not to take again.  . Tetracyclines & Related     Stomach cramps    SOCIAL HISTORY:  Social History   Tobacco Use  . Smoking status: Never Smoker  . Smokeless tobacco: Never Used  Substance Use Topics  . Alcohol use: Yes  Alcohol/week: 1.0 standard drinks    Types: 1 Glasses of wine per week    FAMILY HISTORY: Family History  Problem Relation Age of Onset  . Breast cancer Maternal Aunt 50    EXAM: BP (!) 183/90 (BP Location: Right Arm)   Pulse 90   Temp 98.9 F (37.2 C) (Oral)   Resp 17   SpO2 100%  CONSTITUTIONAL: Alert and oriented and responds appropriately to questions. Well-appearing; well-nourished HEAD: Normocephalic EYES: Conjunctivae clear, pupils appear equal, EOMI, normal visual fields, limited funduscopic exam ENT: normal nose; moist mucous membranes NECK: Supple, no meningismus, no nuchal rigidity,  no LAD  CARD: RRR; S1 and S2 appreciated; no murmurs, no clicks, no rubs, no gallops RESP: Normal chest excursion without splinting or tachypnea; breath sounds clear and equal bilaterally; no wheezes, no rhonchi, no rales, no hypoxia or respiratory distress, speaking full sentences ABD/GI: Normal bowel sounds; non-distended; soft, non-tender, no rebound, no guarding, no peritoneal signs, no hepatosplenomegaly BACK:  The back appears normal and is non-tender to palpation, there is no CVA tenderness EXT: Normal ROM in all joints; non-tender to palpation; no edema; normal capillary refill; no cyanosis, no calf tenderness or swelling    SKIN: Normal color for age and race; warm; no rash NEURO: Moves all extremities equally; Strength 5/5 in all four extremities.  Normal sensation diffusely.  CN 2-12 grossly intact.  No dysmetria to finger to nose testing bilaterally.  Normal speech.  Normal gait. PSYCH: The patient's mood and manner are appropriate. Grooming and personal hygiene are appropriate.  MEDICAL DECISION MAKING:.  Patient here with headache, hypertension.  Blood pressure in the 180s/90s here but headache has improved.  I am concerned about these episodes of vision changes she states she plans to follow-up with her ophthalmologist.  Her vision is currently normal.  No focal neurologic deficits currently.  Will obtain head CT to evaluate for bleed given her severe headache although likely related to her hypertension.  Chest x-ray shows no acute abnormalities.  EKG is normal.  She states she thinks she felt short of breath because of her headache.  Will obtain labs.  Will monitor closely.  She declines any other medications for headache at this time.    ED PROGRESS: Patient's labs, head CT are reassuring.  She reports feeling better and her blood pressure has improved to the 140s/60s.  I feel she is safe to be discharged home and follow-up with her PCP, ophthalmologist as an outpatient.  She will  continue her blood pressure medications as prescribed.  We did discuss return precautions.  Doubt stroke, intracranial hemorrhage, meningitis, encephalitis.  Headache likely caused from hypertension.  Blood pressure improved with her home medications.  At this time, I do not feel there is any life-threatening condition present. I have reviewed and discussed all results (EKG, imaging, lab, urine as appropriate) and exam findings with patient/family. I have reviewed nursing notes and appropriate previous records.  I feel the patient is safe to be discharged home without further emergent workup and can continue workup as an outpatient as needed. Discussed usual and customary return precautions. Patient/family verbalize understanding and are comfortable with this plan.  Outpatient follow-up has been provided if needed. All questions have been answered.      EKG Interpretation  Date/Time:  Tuesday July 04 2018 22:59:52 EDT Ventricular Rate:  85 PR Interval:  158 QRS Duration: 66 QT Interval:  366 QTC Calculation: 435 R Axis:   7 Text Interpretation:  Normal sinus  rhythm Cannot rule out Anterior infarct , age undetermined Abnormal ECG No significant change since last tracing Confirmed by Lourdez Mcgahan, Cyril Mourning [phone]) on 07/05/2018 12:11:01 AM         Ngoc Daughtridge, Delice Bison, DO 07/05/18 0139

## 2018-07-05 NOTE — Discharge Instructions (Addendum)
Please follow-up closely with your primary care physician as well as your ophthalmologist.  Your labs, chest x-ray, EKG, head CT today showed no abnormality.  Please continue your blood pressure medications as prescribed.

## 2018-07-07 ENCOUNTER — Ambulatory Visit (INDEPENDENT_AMBULATORY_CARE_PROVIDER_SITE_OTHER): Payer: Medicare HMO | Admitting: Family Medicine

## 2018-07-07 ENCOUNTER — Encounter: Payer: Self-pay | Admitting: Family Medicine

## 2018-07-07 VITALS — BP 138/86 | HR 69 | Temp 97.9°F | Ht 64.0 in | Wt 168.6 lb

## 2018-07-07 DIAGNOSIS — Z23 Encounter for immunization: Secondary | ICD-10-CM

## 2018-07-07 DIAGNOSIS — R300 Dysuria: Secondary | ICD-10-CM | POA: Diagnosis not present

## 2018-07-07 DIAGNOSIS — E039 Hypothyroidism, unspecified: Secondary | ICD-10-CM

## 2018-07-07 DIAGNOSIS — E876 Hypokalemia: Secondary | ICD-10-CM

## 2018-07-07 DIAGNOSIS — I10 Essential (primary) hypertension: Secondary | ICD-10-CM | POA: Diagnosis not present

## 2018-07-07 LAB — BASIC METABOLIC PANEL
BUN: 20 mg/dL (ref 6–23)
CALCIUM: 10 mg/dL (ref 8.4–10.5)
CO2: 28 mEq/L (ref 19–32)
CREATININE: 0.78 mg/dL (ref 0.40–1.20)
Chloride: 99 mEq/L (ref 96–112)
GFR: 75.82 mL/min (ref >60.00)
GLUCOSE: 93 mg/dL (ref 70–99)
Potassium: 4 mEq/L (ref 3.5–5.1)
SODIUM: 140 meq/L (ref 135–145)

## 2018-07-07 LAB — POCT URINALYSIS DIPSTICK
Bilirubin, UA: NEGATIVE
Glucose, UA: NEGATIVE
KETONES UA: NEGATIVE
NITRITE UA: POSITIVE
PH UA: 6 (ref 5.0–8.0)
Protein, UA: NEGATIVE
RBC UA: NEGATIVE
Spec Grav, UA: 1.015 (ref 1.010–1.025)
UROBILINOGEN UA: 0.2 U/dL

## 2018-07-07 LAB — T4, FREE: Free T4: 1.19 ng/dL (ref 0.60–1.60)

## 2018-07-07 LAB — TSH: TSH: 0.39 u[IU]/mL (ref 0.35–4.50)

## 2018-07-07 NOTE — Progress Notes (Deleted)
Patient: Teresa Rangel MRN: [id number] DOB: 1941/03/05 PCP: Orma Flaming, MD     Subjective:  No chief complaint on file.   HPI: The patient is a 77 y.o. female who presents today for annual exam. {He/she (caps):30048} denies any changes to past medical history. There have been no recent hospitalizations. They {Actions; are/are not:16769} following a well balanced diet and exercise plan. Weight has been {trend:16658}. No complaints today.   Immunization History  Administered Date(s) Administered  . Pneumococcal Polysaccharide-23 06/07/2013  . Tetanus 06/07/2013   Colonoscopy: Mammogram:  Pap smear:  PSA:   Review of Systems  Allergies Patient is allergic to fluzone [flu virus vaccine]; ciprofloxacin; and tetracyclines & related.  Past Medical History Patient  has a past medical history of History of stomach ulcers (1980), Hyperlipidemia, Hypertension, and Hypothyroidism.  Surgical History Patient  has a past surgical history that includes Thyroidectomy (2007); Appendectomy (1954); Tonsillectomy (1949); laparoscopy (3810); and Colonoscopy.  Family History Pateint's family history includes Breast cancer (age of onset: 107) in her maternal aunt.  Social History Patient  reports that she has never smoked. She has never used smokeless tobacco. She reports that she drinks about 1.0 standard drinks of alcohol per week. She reports that she does not use drugs.    Objective: There were no vitals filed for this visit.  There is no height or weight on file to calculate BMI.  Physical Exam     Assessment/plan:   No problem-specific Assessment & Plan notes found for this encounter.    No follow-ups on file.     Orma Flaming, MD [place]  07/07/2018

## 2018-07-07 NOTE — Addendum Note (Signed)
Addended by: Kevan Ny on: 07/07/2018 01:32 PM   Modules accepted: Orders

## 2018-07-07 NOTE — Progress Notes (Signed)
Patient: Teresa Rangel MRN: [id number] DOB: [birth year] PCP: Orma Flaming, MD     Subjective:  Chief Complaint  Patient presents with  . Establish Care  . burning with urination    HPI: The patient is a 77 y.o. female who presents today for establishing care as she is transferring from a different clinic and possible UTI symptoms. She has a hx of HTN, hypothyroid, hyperlipidemia and vitamin D deficiency. She was last seen by PCP for chronic issues in September by PCP. Seen in ED a few days ago for HTN. Work up done and reviewed. Blood pressure has been normal and fine since that time. Has hx of labile bp.   Dysuria: she states she is not having it now, but intermittently she will have pressure/pain at urethra. She quit taking ibuprofen and it hasn't come back. She has no fever/chills, no urgency or frequency. No odor or color change to urine and no visible blood. She feels fine, just wanted to tell me. She does get urine infections frequently.   Also has some concerns this her neck looks puffier than normal and would like me to look at this.   Review of Systems  Constitutional: Negative for fatigue.  Eyes: Negative for visual disturbance.  Respiratory: Negative for shortness of breath.   Cardiovascular: Negative for chest pain.  Gastrointestinal: Negative for abdominal pain and nausea.  Genitourinary: Negative for dysuria, flank pain, frequency, hematuria, pelvic pain and urgency.  Musculoskeletal: Positive for back pain. Negative for neck pain.  Neurological: Positive for headaches. Negative for dizziness.  Psychiatric/Behavioral: Negative for sleep disturbance. The patient is not nervous/anxious.     Allergies Patient is allergic to fluzone [flu virus vaccine]; ciprofloxacin; and tetracyclines & related.  Past Medical History Patient  has a past medical history of History of stomach ulcers (1980), Hyperlipidemia, Hypertension, and Hypothyroidism.  Surgical History Patient   has a past surgical history that includes Thyroidectomy (2007); Appendectomy (1954); Tonsillectomy (1949); laparoscopy (7017); and Colonoscopy.  Family History Pateint's family history includes Breast cancer (age of onset: 13) in her maternal aunt.  Social History Patient  reports that she has never smoked. She has never used smokeless tobacco. She reports that she drinks about 1.0 standard drinks of alcohol per week. She reports that she does not use drugs.    Objective: Vitals:   07/07/18 1144  BP: 138/86  Pulse: 69  Temp: 97.9 F (36.6 C)  TempSrc: Oral  SpO2: 98%  Weight: 168 lb 9.6 oz (76.5 kg)  Height: 5\' 4"  (1.626 m)    Body mass index is 28.94 kg/m.  Physical Exam  Constitutional: She is oriented to person, place, and time. She appears well-developed and well-nourished.  HENT:  Right Ear: External ear normal.  Left Ear: External ear normal.  Mouth/Throat: Oropharynx is clear and moist.  Neck: Normal range of motion. Neck supple. No thyromegaly present.  No abnormal swelling/mass or other finding on exam   Cardiovascular: Normal rate, regular rhythm and normal heart sounds.  Pulmonary/Chest: Effort normal and breath sounds normal.  Abdominal: Soft. Bowel sounds are normal.  Musculoskeletal:  No cva tenderness   Lymphadenopathy:    She has no cervical adenopathy.  Neurological: She is alert and oriented to person, place, and time.  Skin: Skin is warm and dry.  Vitals reviewed.  ER notes reviewed      Assessment/plan: 1. Burning with urination symptoms have resolved and UA unimpressive. Will f/u on culture, but she is asymptomatic and doubtful that  she has an infection.  - POCT Urinalysis Dipstick - Urine Culture  2. Acquired hypothyroidism Labs abnormal in July, rechecking today  - T4, free - TSH  3. Essential hypertension To goal today and has been since ER visit. She followed up with retina specialist as well and eye exam normal. CT of head and all  labs normal in ER. Potassium was low so will recheck that today. continue with current medication and will f/u with me for routine f/u in 5 months.   -pcv 13 shot today.    Return in about 5 months (around 12/06/2018) for routine chronic f/u .   Orma Flaming, MD [place]   07/07/2018

## 2018-07-10 ENCOUNTER — Other Ambulatory Visit: Payer: Self-pay | Admitting: Family Medicine

## 2018-07-10 ENCOUNTER — Telehealth: Payer: Self-pay | Admitting: Family Medicine

## 2018-07-10 LAB — URINE CULTURE
MICRO NUMBER:: 91286569
SPECIMEN QUALITY:: ADEQUATE

## 2018-07-10 MED ORDER — SULFAMETHOXAZOLE-TRIMETHOPRIM 800-160 MG PO TABS: 1.0000 | ORAL_TABLET | Freq: Two times a day (BID) | ORAL | 0 refills | Status: DC

## 2018-07-10 NOTE — Telephone Encounter (Signed)
Copied from [place] [id number]. Topic: Quick Communication - Lab Results (Clinic Use ONLY) >> Jul 10, 2018  3:54 PM Zellmer, Clearnce Sorrel, CMA wrote: Called patient to inform them of 10/25 lab results. When patient returns call, triage nurse may disclose results. >> Jul 10, 2018  3:59 PM Bea Graff, NT wrote: Pt calling back to receive lab results. Please advise.

## 2018-07-10 NOTE — Progress Notes (Signed)
b

## 2018-07-10 NOTE — Telephone Encounter (Signed)
Results reviewed in result note encounter.

## 2018-07-11 ENCOUNTER — Telehealth: Payer: Self-pay | Admitting: Family Medicine

## 2018-07-11 NOTE — Telephone Encounter (Signed)
See result note.  

## 2018-07-11 NOTE — Telephone Encounter (Signed)
Pt returned call  Copied from [place] [id number]. Topic: Quick Communication - Lab Results (Clinic Use ONLY) >> Jul 11, 2018  9:36 AM Teresa Area, RN wrote: Hulen Skains patient to inform them of 07/07/18 urine results. When patient returns call, triage nurse may disclose results. Patient received results. Did she start the antibiotic? Any side effects?

## 2018-07-12 ENCOUNTER — Encounter: Payer: Self-pay | Admitting: Family Medicine

## 2018-07-12 ENCOUNTER — Other Ambulatory Visit: Payer: Self-pay | Admitting: Family Medicine

## 2018-07-12 MED ORDER — CIPROFLOXACIN HCL 500 MG PO TABS: 500.0000 mg | ORAL_TABLET | Freq: Two times a day (BID) | ORAL | 0 refills | Status: DC

## 2018-07-13 ENCOUNTER — Encounter: Payer: Self-pay | Admitting: Family Medicine

## 2018-07-17 ENCOUNTER — Encounter: Payer: Self-pay | Admitting: Family Medicine

## 2018-07-18 ENCOUNTER — Other Ambulatory Visit: Payer: Self-pay

## 2018-07-18 MED ORDER — QUINAPRIL HCL 40 MG PO TABS: 40.0000 mg | ORAL_TABLET | Freq: Every day | ORAL | 0 refills | Status: DC

## 2018-08-04 ENCOUNTER — Encounter: Payer: Medicare HMO | Admitting: Internal Medicine

## 2018-08-04 ENCOUNTER — Encounter

## 2018-09-21 DIAGNOSIS — M79605 Pain in left leg: Secondary | ICD-10-CM | POA: Diagnosis not present

## 2018-09-21 DIAGNOSIS — M79604 Pain in right leg: Secondary | ICD-10-CM | POA: Diagnosis not present

## 2018-09-21 DIAGNOSIS — G2581 Restless legs syndrome: Secondary | ICD-10-CM | POA: Diagnosis not present

## 2018-10-12 DIAGNOSIS — M79605 Pain in left leg: Secondary | ICD-10-CM | POA: Diagnosis not present

## 2018-10-12 DIAGNOSIS — G2581 Restless legs syndrome: Secondary | ICD-10-CM | POA: Diagnosis not present

## 2018-10-12 DIAGNOSIS — M79604 Pain in right leg: Secondary | ICD-10-CM | POA: Diagnosis not present

## 2018-10-26 DIAGNOSIS — I87393 Chronic venous hypertension (idiopathic) with other complications of bilateral lower extremity: Secondary | ICD-10-CM | POA: Diagnosis not present

## 2018-11-03 ENCOUNTER — Other Ambulatory Visit: Payer: Self-pay | Admitting: Family Medicine

## 2018-11-03 DIAGNOSIS — Z1231 Encounter for screening mammogram for malignant neoplasm of breast: Secondary | ICD-10-CM

## 2018-12-06 ENCOUNTER — Encounter: Payer: Self-pay | Admitting: Family Medicine

## 2018-12-06 DIAGNOSIS — I83813 Varicose veins of bilateral lower extremities with pain: Secondary | ICD-10-CM | POA: Insufficient documentation

## 2018-12-07 ENCOUNTER — Ambulatory Visit: Payer: Medicare HMO

## 2019-01-22 DIAGNOSIS — H00035 Abscess of left lower eyelid: Secondary | ICD-10-CM | POA: Diagnosis not present

## 2019-01-22 DIAGNOSIS — H0015 Chalazion left lower eyelid: Secondary | ICD-10-CM | POA: Diagnosis not present

## 2019-02-12 DIAGNOSIS — I8311 Varicose veins of right lower extremity with inflammation: Secondary | ICD-10-CM | POA: Diagnosis not present

## 2019-02-19 ENCOUNTER — Ambulatory Visit
Admission: RE | Admit: 2019-02-19 | Discharge: 2019-02-19 | Disposition: A | Discharge status: Home / Self Care | Payer: Medicare HMO | Source: Ambulatory Visit | Attending: Family Medicine | Admitting: Family Medicine

## 2019-02-19 ENCOUNTER — Other Ambulatory Visit: Payer: Self-pay

## 2019-02-19 DIAGNOSIS — Z1231 Encounter for screening mammogram for malignant neoplasm of breast: Secondary | ICD-10-CM

## 2019-02-27 ENCOUNTER — Other Ambulatory Visit: Payer: Self-pay

## 2019-02-27 ENCOUNTER — Telehealth: Payer: Self-pay

## 2019-02-27 ENCOUNTER — Ambulatory Visit (INDEPENDENT_AMBULATORY_CARE_PROVIDER_SITE_OTHER): Payer: Medicare HMO | Admitting: Family Medicine

## 2019-02-27 ENCOUNTER — Encounter: Payer: Self-pay | Admitting: Family Medicine

## 2019-02-27 VITALS — BP 122/74 | HR 84 | Temp 98.5°F | Ht 64.0 in | Wt 169.2 lb

## 2019-02-27 DIAGNOSIS — M109 Gout, unspecified: Secondary | ICD-10-CM | POA: Diagnosis not present

## 2019-02-27 MED ORDER — PREDNISONE 50 MG PO TABS: ORAL_TABLET | ORAL | 0 refills | Status: DC

## 2019-02-27 MED ORDER — COLCHICINE 0.6 MG PO CAPS: ORAL_CAPSULE | ORAL | 0 refills | Status: DC

## 2019-02-27 NOTE — Telephone Encounter (Signed)
Spoke with patient.  She had sent MyChart message with c/o gout flare up in left big toe.  States has not happened in quite a while but it is quite painful.  The pain kept her up a good bit during the night and she has been taking Ibuprofen for the pain w/o much relief.  Advised that Dr. Rogers Blocker is currently out on maternity leave but offered a 1:20 appointment today with Dr. Jerline Pain and patient accepted appointment.   Will route message to Dr. Jerline Pain and his team for review

## 2019-02-27 NOTE — Progress Notes (Signed)
   Chief Complaint:  Teresa Rangel is a 78 y.o. female who presents for same day appointment with a chief complaint of toe Rangel.   Assessment/Plan:  Acute gout flare No red flags.  Start colchicine and prednisone.  Discussed potential side effects.  Discussed foods to avoid.  She can continue using ice and ibuprofen as needed as well.  Discussed trial of allopurinol in the future, however patient declined at this point as she does not want to take a daily medication. Discussed reasons to return to care and seek emergent care.     Subjective:  HPI:  Toe Rangel, acute problem Started yesterday.  She has taken some ibuprofen which has helped improve some of the Rangel today.  Rangel located in left great toe.  Rangel is consistent with prior gout flares.  This is her third flare in the past 5 years.  No obvious precipitating events.  No obvious trauma.  No other treatments tried. No other obvious alleviating or aggravating factors.   ROS: Per HPI  PMH: She reports that she has never smoked. She has never used smokeless tobacco. She reports current alcohol use of about 1.0 standard drinks of alcohol per week. She reports that she does not use drugs.      Objective:  Physical Exam: BP 122/74 (BP Location: Left Arm, Patient Position: Sitting, Cuff Size: Normal)   Pulse 84   Temp 98.5 F (36.9 C) (Oral)   Ht 5\' 4"  (1.626 m)   Wt 169 lb 3.2 oz (76.7 kg)   LMP  (LMP Unknown)   SpO2 98%   BMI 29.04 kg/m   Gen: NAD, resting comfortably MSK: Left great toe edematous and erythematous.  Neurovascular intact distally.      Teresa Rangel. Teresa Pain, MD 02/27/2019 1:44 PM

## 2019-02-27 NOTE — Telephone Encounter (Signed)
Noted  

## 2019-02-27 NOTE — Patient Instructions (Signed)
It was very nice to see you today!  You have a gout flare in your toe.  Please start the colchicine and the prednisone.  Let me know if your symptoms worsen or do not improve in the next few days.  Take care, Dr Jerline Pain

## 2019-03-09 ENCOUNTER — Encounter: Payer: Self-pay | Admitting: Family Medicine

## 2019-03-09 DIAGNOSIS — D1801 Hemangioma of skin and subcutaneous tissue: Secondary | ICD-10-CM | POA: Diagnosis not present

## 2019-03-09 DIAGNOSIS — L720 Epidermal cyst: Secondary | ICD-10-CM | POA: Diagnosis not present

## 2019-03-09 DIAGNOSIS — L821 Other seborrheic keratosis: Secondary | ICD-10-CM | POA: Diagnosis not present

## 2019-03-09 DIAGNOSIS — L853 Xerosis cutis: Secondary | ICD-10-CM | POA: Diagnosis not present

## 2019-03-14 ENCOUNTER — Encounter: Payer: Self-pay | Admitting: Family Medicine

## 2019-03-14 ENCOUNTER — Other Ambulatory Visit: Payer: Self-pay

## 2019-03-14 ENCOUNTER — Ambulatory Visit (INDEPENDENT_AMBULATORY_CARE_PROVIDER_SITE_OTHER): Payer: Medicare HMO | Admitting: Family Medicine

## 2019-03-14 VITALS — BP 122/74 | HR 72 | Temp 98.2°F | Ht 64.0 in | Wt 167.2 lb

## 2019-03-14 DIAGNOSIS — K2100 Gastro-esophageal reflux disease with esophagitis, without bleeding: Secondary | ICD-10-CM

## 2019-03-14 MED ORDER — PANTOPRAZOLE SODIUM 40 MG PO TBEC: 40.0000 mg | DELAYED_RELEASE_TABLET | Freq: Every day | ORAL | 0 refills | Status: DC

## 2019-03-14 NOTE — Progress Notes (Signed)
   Chief Complaint:  Teresa Rangel is a 78 y.o. female who presents today with a chief complaint of indigestion.   Assessment/Plan:  GERD/indigestion No red flags.  Think that she likely had mild esophageal spasm during the 2 episodes that awaken her at night.  Will start short course of Protonix 40 mg daily for the next 2 weeks.  She will resume her diet and pay attention closely to any specific triggers.  If no improvement or symptoms worsen will need referral to GI.    Subjective:  HPI:  Indigestion Patient has longstanding history of gastric reflux with prior history of peptic ulcer disease.  She is presently taking Zantac for this however due to the recent recall she has been on Pepcid for the past several months.  She has had 2 episodes over the past month and when she wakes up in the middle of the night with severe burning sensation in her upper abdomen and severe nausea.  She then develops watery mouth and symptoms subside after a few minutes.  First episode was about a month ago.  Had repeat episode about 10 days ago.  She has had persistent upper epigastric abdominal pain over the last week or so.  She is taking Pepcid twice daily.  She has transition to a more bland diet over the past few days and thinks that this is helped with her symptoms.  No reported unintentional weight loss or early satiety.  No other obvious alleviating or aggravating factors.  ROS: Per HPI  PMH: She reports that she has never smoked. She has never used smokeless tobacco. She reports current alcohol use of about 1.0 standard drinks of alcohol per week. She reports that she does not use drugs.      Objective:  Physical Exam: BP 122/74 (BP Location: Left Arm, Patient Position: Sitting, Cuff Size: Normal)   Pulse 72   Temp 98.2 F (36.8 C) (Oral)   Ht 5\' 4"  (1.626 m)   Wt 167 lb 4 oz (75.9 kg)   LMP  (LMP Unknown)   SpO2 97%   BMI 28.71 kg/m   Gen: NAD, resting comfortably HEENT: Oral mucosa with no  obvious abnormalities.  No lymphadenopathy or salivary gland hypertrophy noted. CV: Regular rate and rhythm with no murmurs appreciated Pulm: Normal work of breathing, clear to auscultation bilaterally with no crackles, wheezes, or rhonchi GI: Normal bowel sounds present. Soft, Nontender, Nondistended.      Algis Greenhouse. Jerline Pain, MD 03/14/2019 10:24 AM

## 2019-03-14 NOTE — Patient Instructions (Signed)
It was very nice to see you today!  I think you are having some reflux and esophageal irritation.  Please start the Protonix 1 pill daily for the next couple of weeks.  Let me know if your symptoms worsen or do not improve over this time.  Take care, Dr Jerline Pain

## 2019-03-25 ENCOUNTER — Other Ambulatory Visit: Payer: Self-pay | Admitting: Family Medicine

## 2019-03-28 ENCOUNTER — Encounter: Payer: Self-pay | Admitting: Family Medicine

## 2019-04-09 ENCOUNTER — Other Ambulatory Visit: Payer: Self-pay | Admitting: Family Medicine

## 2019-04-30 ENCOUNTER — Other Ambulatory Visit: Payer: Self-pay

## 2019-04-30 ENCOUNTER — Ambulatory Visit (INDEPENDENT_AMBULATORY_CARE_PROVIDER_SITE_OTHER): Payer: Medicare HMO

## 2019-04-30 DIAGNOSIS — Z0000 Encounter for general adult medical examination without abnormal findings: Secondary | ICD-10-CM

## 2019-04-30 DIAGNOSIS — Z780 Asymptomatic menopausal state: Secondary | ICD-10-CM

## 2019-04-30 NOTE — Progress Notes (Signed)
This visit is being conducted via phone call due to the COVID-19 pandemic. This patient has given me verbal consent via phone to conduct this visit, patient states they are participating from their home address. Some vital signs may be absent or patient reported.   Patient identification: identified by name, DOB, and current address.    Subjective:   Teresa Rangel is a 78 y.o. female who presents for Medicare Annual (Subsequent) preventive examination.  Review of Systems:   Cardiac Risk Factors include: dyslipidemia;hypertension;advanced age (>23men, >5 women)     Objective:     Vitals: LMP  (LMP Unknown)   There is no height or weight on file to calculate BMI.  Advanced Directives 04/30/2019 07/04/2018 11/30/2015  Does Patient Have a Medical Advance Directive? No No No  Would patient like information on creating a medical advance directive? No - Patient declined No - Patient declined No - patient declined information    Tobacco Social History   Tobacco Use  Smoking Status Never Smoker  Smokeless Tobacco Never Used      Clinical Intake:  Pre-visit preparation completed: Yes  Pain : No/denies pain  Diabetes: No  How often do you need to have someone help you when you read instructions, pamphlets, or other written materials from your doctor or pharmacy?: 1 - Never  Interpreter Needed?: No  Information entered by :: Denman George LPN  Past Medical History:  Diagnosis Date  . History of stomach ulcers 1980  . Hyperlipidemia   . Hypertension   . Hypothyroidism    Past Surgical History:  Procedure Laterality Date  . APPENDECTOMY  1954  . COLONOSCOPY    . LAPAROSCOPY  1982  . THYROIDECTOMY  2007  . TONSILLECTOMY  1949   Family History  Problem Relation Age of Onset  . Breast cancer Maternal Aunt 23   Social History   Socioeconomic History  . Marital status: Married    Spouse name: Not on file  . Number of children: Not on file  . Years of  education: Not on file  . Highest education level: Not on file  Occupational History  . Not on file  Social Needs  . Financial resource strain: Not on file  . Food insecurity    Worry: Not on file    Inability: Not on file  . Transportation needs    Medical: Not on file    Non-medical: Not on file  Tobacco Use  . Smoking status: Never Smoker  . Smokeless tobacco: Never Used  Substance and Sexual Activity  . Alcohol use: Yes    Alcohol/week: 1.0 standard drinks    Types: 1 Glasses of wine per week  . Drug use: No  . Sexual activity: Yes    Partners: Male  Lifestyle  . Physical activity    Days per week: Not on file    Minutes per session: Not on file  . Stress: Not on file  Relationships  . Social Herbalist on phone: Not on file    Gets together: Not on file    Attends religious service: Not on file    Active member of club or organization: Not on file    Attends meetings of clubs or organizations: Not on file    Relationship status: Not on file  Other Topics Concern  . 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 amLODipine (NORVASC) 5 MG tablet Take 1 tablet (5 mg total) by mouth daily.  . cholecalciferol (VITAMIN D) 1000 UNITS tablet Take 2,000 Units by mouth daily.  . hydrochlorothiazide (HYDRODIURIL) 25 MG tablet TAKE ONE TABLET BY MOUTH DAILY (Patient taking differently: Take 25 mg by mouth daily. )  . levothyroxine (SYNTHROID, LEVOTHROID) 88 MCG tablet Take 1 tablet (88 mcg total) by mouth daily.  . pantoprazole (PROTONIX) 40 MG tablet TAKE ONE TABLET BY MOUTH DAILY  . pravastatin (PRAVACHOL) 20 MG tablet Take 1 tablet (20 mg total) by mouth daily.  . quinapril (ACCUPRIL) 40 MG tablet Take 1 tablet (40 mg total) by mouth at bedtime.  . vitamin C (ASCORBIC ACID) 500 MG tablet Take 500 mg by mouth daily.   No facility-administered encounter  medications on file as of 04/30/2019.     Activities of Daily Living In your present state of health, do you have any difficulty performing the following activities: 04/30/2019  Hearing? N  Vision? N  Difficulty concentrating or making decisions? N  Walking or climbing stairs? N  Dressing or bathing? N  Doing errands, shopping? N  Preparing Food and eating ? N  Using the Toilet? N  In the past six months, have you accidently leaked urine? N  Do you have problems with loss of bowel control? N  Managing your Medications? N  Managing your Finances? N  Housekeeping or managing your Housekeeping? N  Some recent data might be hidden    Patient Care Team: Orma Flaming, MD as PCP - General (Family Medicine)    Assessment:   This is a routine wellness examination for Teresa Rangel.  Exercise Activities and Dietary recommendations Current Exercise Habits: Home exercise routine, Type of exercise: walking, Time (Minutes): 30, Frequency (Times/Week): 7, Weekly Exercise (Minutes/Week): 210, Intensity: Mild  Goals   None     Fall Risk Fall Risk  04/30/2019 12/19/2017 11/19/2016 07/11/2015 04/21/2015  Falls in the past year? 0 No No No No  Number falls in past yr: 0 - - - -  Injury with Fall? 0 - - - -  Follow up Education provided - - - -   Depression Screen PHQ 2/9 Scores 04/30/2019 12/19/2017 11/19/2016 07/11/2015  PHQ - 2 Score 0 0 0 0     Cognitive Function  No cognitive concerns at this time   Immunization History  Administered Date(s) Administered  . Pneumococcal Conjugate-13 07/07/2018  . Pneumococcal Polysaccharide-23 06/07/2013  . Tetanus 06/07/2013    Qualifies for Shingles Vaccine? Discussed and patient will check with pharmacy for coverage.  Patient education handout provided   Screening Tests Health Maintenance  Topic Date Due  . INFLUENZA VACCINE  04/14/2019  . MAMMOGRAM  02/19/2020  . TETANUS/TDAP  06/08/2023  . DEXA SCAN  Completed  . PNA vac Low Risk Adult  Completed     Cancer Screenings: Breast:  Up to date on Mammogram? Yes; last 02/19/19   Up to date of Bone Density/Dexa? Orders placed; last 2016 Colorectal: Up to date; last 02/27/13 colonoscopy with Dr. Carlean Purl      Plan:    I have personally reviewed and addressed the Medicare Annual Wellness questionnaire and have noted the following in the patient's chart:  A. Medical and social history B. Use of alcohol, tobacco or illicit drugs  C. Current medications and supplements D. Functional ability and status E.  Nutritional status F.  Physical activity G. Advance directives H. List of other  physicians I.  Hospitalizations, surgeries, and ER visits in previous 12 months J.  Tok such as hearing and vision if needed, cognitive and depression L. Referrals, records requested, and appointments-   In addition, I have reviewed and discussed with patient certain preventive protocols, quality metrics, and best practice recommendations. A written personalized care plan for preventive services as well as general preventive health recommendations were provided to patient.   Signed,  Denman George, LPN  Nurse Health Advisor   Nurse Notes: no additions

## 2019-04-30 NOTE — Progress Notes (Signed)
I have reviewed the documentation from the recent AWV done by Kim Broome; I agree with the documentation and will follow up on any recommendations or abnormal findings as suggested.  

## 2019-04-30 NOTE — Patient Instructions (Signed)
Teresa Rangel , Thank you for taking time to come for your Medicare Wellness Visit. I appreciate your ongoing commitment to your health goals. Please review the following plan we discussed and let me know if I can assist you in the future.   Screening recommendations/referrals: Colorectal Screening: up to date; last with Dr. Carlean Purl 02/28/2016 Mammogram: up to date; last 02/19/19 Bone Density: ordered and to be scheduled   Vision and Dental Exams: Recommended annual ophthalmology exams for early detection of glaucoma and other disorders of the eye Recommended annual dental exams for proper oral hygiene  Vaccinations: Influenza vaccine:  recommended this fall either at PCP office or through your local pharmacy  Pneumococcal vaccine: up to date  Tdap vaccine: up to date  Shingles vaccine: Please call your insurance company to determine your out of pocket expense for the Shingrix vaccine. You may receive this vaccine at your local pharmacy.  Goals: Recommend to exercise for at least 150 minutes per week.   Next appointment: Please schedule your Annual Wellness Visit with your Nurse Health Advisor in one year.  Preventive Care 44 Years and Older, Female Preventive care refers to lifestyle choices and visits with your health care provider that can promote health and wellness. What does preventive care include?  A yearly physical exam. This is also called an annual well check.  Dental exams once or twice a year.  Routine eye exams. Ask your health care provider how often you should have your eyes checked.  Personal lifestyle choices, including:  Daily care of your teeth and gums.  Regular physical activity.  Eating a healthy diet.  Avoiding tobacco and drug use.  Limiting alcohol use.  Practicing safe sex.  Taking low-dose aspirin every day if recommended by your health care provider.  Taking vitamin and mineral supplements as recommended by your health care provider. What  happens during an annual well check? The services and screenings done by your health care provider during your annual well check will depend on your age, overall health, lifestyle risk factors, and family history of disease. Counseling  Your health care provider may ask you questions about your:  Alcohol use.  Tobacco use.  Drug use.  Emotional well-being.  Home and relationship well-being.  Sexual activity.  Eating habits.  History of falls.  Memory and ability to understand (cognition).  Work and work Statistician.  Reproductive health. Screening  You may have the following tests or measurements:  Height, weight, and BMI.  Blood pressure.  Lipid and cholesterol levels. These may be checked every 5 years, or more frequently if you are over 74 years old.  Skin check.  Lung cancer screening. You may have this screening every year starting at age 67 if you have a 30-pack-year history of smoking and currently smoke or have quit within the past 15 years.  Fecal occult blood test (FOBT) of the stool. You may have this test every year starting at age 78.  Flexible sigmoidoscopy or colonoscopy. You may have a sigmoidoscopy every 5 years or a colonoscopy every 10 years starting at age 10.  Hepatitis C blood test.  Hepatitis B blood test.  Sexually transmitted disease (STD) testing.  Diabetes screening. This is done by checking your blood sugar (glucose) after you have not eaten for a while (fasting). You may have this done every 1-3 years.  Bone density scan. This is done to screen for osteoporosis. You may have this done starting at age 5.  Mammogram. This may be  done every 1-2 years. Talk to your health care provider about how often you should have regular mammograms. Talk with your health care provider about your test results, treatment options, and if necessary, the need for more tests. Vaccines  Your health care provider may recommend certain vaccines, such as:   Influenza vaccine. This is recommended every year.  Tetanus, diphtheria, and acellular pertussis (Tdap, Td) vaccine. You may need a Td booster every 10 years.  Zoster vaccine. You may need this after age 68.  Pneumococcal 13-valent conjugate (PCV13) vaccine. One dose is recommended after age 42.  Pneumococcal polysaccharide (PPSV23) vaccine. One dose is recommended after age 58. Talk to your health care provider about which screenings and vaccines you need and how often you need them. This information is not intended to replace advice given to you by your health care provider. Make sure you discuss any questions you have with your health care provider. Document Released: 09/26/2015 Document Revised: 05/19/2016 Document Reviewed: 07/01/2015 Elsevier Interactive Patient Education  2017 [place] Prevention in the Home Falls can cause injuries. They can happen to people of all ages. There are many things you can do to make your home safe and to help prevent falls. What can I do on the outside of my home?  Regularly fix the edges of walkways and driveways and fix any cracks.  Remove anything that might make you trip as you walk through a door, such as a raised step or threshold.  Trim any bushes or trees on the path to your home.  Use bright outdoor lighting.  Clear any walking paths of anything that might make someone trip, such as rocks or tools.  Regularly check to see if handrails are loose or broken. Make sure that both sides of any steps have handrails.  Any raised decks and porches should have guardrails on the edges.  Have any leaves, snow, or ice cleared regularly.  Use sand or salt on walking paths during winter.  Clean up any spills in your garage right away. This includes oil or grease spills. What can I do in the bathroom?  Use night lights.  Install grab bars by the toilet and in the tub and shower. Do not use towel bars as grab bars.  Use non-skid mats  or decals in the tub or shower.  If you need to sit down in the shower, use a plastic, non-slip stool.  Keep the floor dry. Clean up any water that spills on the floor as soon as it happens.  Remove soap buildup in the tub or shower regularly.  Attach bath mats securely with double-sided non-slip rug tape.  Do not have throw rugs and other things on the floor that can make you trip. What can I do in the bedroom?  Use night lights.  Make sure that you have a light by your bed that is easy to reach.  Do not use any sheets or blankets that are too big for your bed. They should not hang down onto the floor.  Have a firm chair that has side arms. You can use this for support while you get dressed.  Do not have throw rugs and other things on the floor that can make you trip. What can I do in the kitchen?  Clean up any spills right away.  Avoid walking on wet floors.  Keep items that you use a lot in easy-to-reach places.  If you need to reach something above you, use  a strong step stool that has a grab bar.  Keep electrical cords out of the way.  Do not use floor polish or wax that makes floors slippery. If you must use wax, use non-skid floor wax.  Do not have throw rugs and other things on the floor that can make you trip. What can I do with my stairs?  Do not leave any items on the stairs.  Make sure that there are handrails on both sides of the stairs and use them. Fix handrails that are broken or loose. Make sure that handrails are as long as the stairways.  Check any carpeting to make sure that it is firmly attached to the stairs. Fix any carpet that is loose or worn.  Avoid having throw rugs at the top or bottom of the stairs. If you do have throw rugs, attach them to the floor with carpet tape.  Make sure that you have a light switch at the top of the stairs and the bottom of the stairs. If you do not have them, ask someone to add them for you. What else can I do to  help prevent falls?  Wear shoes that:  Do not have high heels.  Have rubber bottoms.  Are comfortable and fit you well.  Are closed at the toe. Do not wear sandals.  If you use a stepladder:  Make sure that it is fully opened. Do not climb a closed stepladder.  Make sure that both sides of the stepladder are locked into place.  Ask someone to hold it for you, if possible.  Clearly mark and make sure that you can see:  Any grab bars or handrails.  First and last steps.  Where the edge of each step is.  Use tools that help you move around (mobility aids) if they are needed. These include:  Canes.  Walkers.  Scooters.  Crutches.  Turn on the lights when you go into a dark area. Replace any light bulbs as soon as they burn out.  Set up your furniture so you have a clear path. Avoid moving your furniture around.  If any of your floors are uneven, fix them.  If there are any pets around you, be aware of where they are.  Review your medicines with your doctor. Some medicines can make you feel dizzy. This can increase your chance of falling. Ask your doctor what other things that you can do to help prevent falls. This information is not intended to replace advice given to you by your health care provider. Make sure you discuss any questions you have with your health care provider. Document Released: 06/26/2009 Document Revised: 02/05/2016 Document Reviewed: 10/04/2014 Elsevier Interactive Patient Education  2017 [employer].

## 2019-05-01 ENCOUNTER — Encounter: Payer: Self-pay | Admitting: Family Medicine

## 2019-05-02 ENCOUNTER — Ambulatory Visit (INDEPENDENT_AMBULATORY_CARE_PROVIDER_SITE_OTHER): Payer: Medicare HMO | Admitting: Family Medicine

## 2019-05-02 ENCOUNTER — Encounter: Payer: Self-pay | Admitting: Family Medicine

## 2019-05-02 ENCOUNTER — Other Ambulatory Visit: Payer: Self-pay

## 2019-05-02 VITALS — BP 135/84 | HR 84 | Temp 97.7°F | Ht 64.0 in | Wt 169.4 lb

## 2019-05-02 DIAGNOSIS — E039 Hypothyroidism, unspecified: Secondary | ICD-10-CM | POA: Diagnosis not present

## 2019-05-02 DIAGNOSIS — E559 Vitamin D deficiency, unspecified: Secondary | ICD-10-CM

## 2019-05-02 DIAGNOSIS — I10 Essential (primary) hypertension: Secondary | ICD-10-CM

## 2019-05-02 DIAGNOSIS — R42 Dizziness and giddiness: Secondary | ICD-10-CM | POA: Diagnosis not present

## 2019-05-02 LAB — COMPREHENSIVE METABOLIC PANEL
ALT: 18 U/L (ref 0–35)
AST: 18 U/L (ref 0–37)
Albumin: 4.3 g/dL (ref 3.5–5.2)
Alkaline Phosphatase: 68 U/L (ref 39–117)
BUN: 18 mg/dL (ref 6–23)
CO2: 28 mEq/L (ref 19–32)
Calcium: 9.3 mg/dL (ref 8.4–10.5)
Chloride: 102 mEq/L (ref 96–112)
Creatinine, Ser: 0.69 mg/dL (ref 0.40–1.20)
GFR: 82.01 mL/min (ref >60.00)
Glucose, Bld: 87 mg/dL (ref 70–99)
Potassium: 3.5 mEq/L (ref 3.5–5.1)
Sodium: 139 mEq/L (ref 135–145)
Total Bilirubin: 0.6 mg/dL (ref 0.2–1.2)
Total Protein: 6.7 g/dL (ref 6.0–8.3)

## 2019-05-02 LAB — CBC WITH DIFFERENTIAL/PLATELET
Basophils Absolute: 0 10*3/uL (ref 0.0–0.1)
Basophils Relative: 0.6 % (ref 0.0–3.0)
Eosinophils Absolute: 0.6 10*3/uL (ref 0.0–0.7)
Eosinophils Relative: 7.9 % — ABNORMAL HIGH (ref 0.0–5.0)
HCT: 43.4 % (ref 36.0–46.0)
Hemoglobin: 14.8 g/dL (ref 12.0–15.0)
Lymphocytes Relative: 27.9 % (ref 12.0–46.0)
Lymphs Abs: 2 10*3/uL (ref 0.7–4.0)
MCHC: 34.1 g/dL (ref 30.0–36.0)
MCV: 82.5 fl (ref 78.0–100.0)
Monocytes Absolute: 0.6 10*3/uL (ref 0.1–1.0)
Monocytes Relative: 9 % (ref 3.0–12.0)
Neutro Abs: 3.9 10*3/uL (ref 1.4–7.7)
Neutrophils Relative %: 54.6 % (ref 43.0–77.0)
Platelets: 244 10*3/uL (ref 150.0–400.0)
RBC: 5.27 Mil/uL — ABNORMAL HIGH (ref 3.87–5.11)
RDW: 13.9 % (ref 11.5–15.5)
WBC: 7.1 10*3/uL (ref 4.0–10.5)

## 2019-05-02 LAB — TSH: TSH: 0.12 u[IU]/mL — ABNORMAL LOW (ref 0.35–4.50)

## 2019-05-02 LAB — VITAMIN D 25 HYDROXY (VIT D DEFICIENCY, FRACTURES): VITD: 41.48 ng/mL (ref 30.00–100.00)

## 2019-05-02 MED ORDER — FLUTICASONE PROPIONATE 50 MCG/ACT NA SUSP: 2.0000 | Freq: Every day | NASAL | 6 refills | Status: DC

## 2019-05-02 NOTE — Patient Instructions (Signed)
Orthostatic Hypotension Blood pressure is a measurement of how strongly, or weakly, your blood is pressing against the walls of your arteries. Orthostatic hypotension is a sudden drop in blood pressure that happens when you quickly change positions, such as when you get up from sitting or lying down. Arteries are blood vessels that carry blood from your heart throughout your body. When blood pressure is too low, you may not get enough blood to your brain or to the rest of your organs. This can cause weakness, light-headedness, rapid heartbeat, and fainting. This can last for just a few seconds or for up to a few minutes. Orthostatic hypotension is usually not a serious problem. However, if it happens frequently or gets worse, it may be a sign of something more serious. What are the causes? This condition may be caused by:  Sudden changes in posture, such as standing up quickly after you have been sitting or lying down.  Blood loss.  Loss of body fluids (dehydration).  Heart problems.  Hormone (endocrine) problems.  Pregnancy.  Severe infection.  Lack of certain nutrients.  Severe allergic reactions (anaphylaxis).  Certain medicines, such as blood pressure medicine or medicines that make the body lose excess fluids (diuretics). Sometimes, this condition can be caused by not taking medicine as directed, such as taking too much of a certain medicine. What increases the risk? The following factors may make you more likely to develop this condition:  Age. Risk increases as you get older.  Conditions that affect the heart or the central nervous system.  Taking certain medicines, such as blood pressure medicine or diuretics.  Being pregnant. What are the signs or symptoms? Symptoms of this condition may include:  Weakness.  Light-headedness.  Dizziness.  Blurred vision.  Fatigue.  Rapid heartbeat.  Fainting, in severe cases. How is this diagnosed? This condition is  diagnosed based on:  Your medical history.  Your symptoms.  Your blood pressure measurement. Your health care provider will check your blood pressure when you are: ? Lying down. ? Sitting. ? Standing. A blood pressure reading is recorded as two numbers, such as "120 over 80" (or 120/80). The first ("top") number is called the systolic pressure. It is a measure of the pressure in your arteries as your heart beats. The second ("bottom") number is called the diastolic pressure. It is a measure of the pressure in your arteries when your heart relaxes between beats. Blood pressure is measured in a unit called mm Hg. Healthy blood pressure for most adults is 120/80. If your blood pressure is below 90/60, you may be diagnosed with hypotension. Other information or tests that may be used to diagnose orthostatic hypotension include:  Your other vital signs, such as your heart rate and temperature.  Blood tests.  Tilt table test. For this test, you will be safely secured to a table that moves you from a lying position to an upright position. Your heart rhythm and blood pressure will be monitored during the test. How is this treated? This condition may be treated by:  Changing your diet. This may involve eating more salt (sodium) or drinking more water.  Taking medicines to raise your blood pressure.  Changing the dosage of certain medicines you are taking that might be lowering your blood pressure.  Wearing compression stockings. These stockings help to prevent blood clots and reduce swelling in your legs. In some cases, you may need to go to the hospital for:  Fluid replacement. This means you will   receive fluids through an IV.  Blood replacement. This means you will receive donated blood through an IV (transfusion).  Treating an infection or heart problems, if this applies.  Monitoring. You may need to be monitored while medicines that you are taking wear off. Follow these instructions  at home: Eating and drinking   Drink enough fluid to keep your urine pale yellow.  Eat a healthy diet, and follow instructions from your health care provider about eating or drinking restrictions. A healthy diet includes: ? Fresh fruits and vegetables. ? Whole grains. ? Lean meats. ? Low-fat dairy products.  Eat extra salt only as directed. Do not add extra salt to your diet unless your health care provider told you to do that.  Eat frequent, small meals.  Avoid standing up suddenly after eating. Medicines  Take over-the-counter and prescription medicines only as told by your health care provider. ? Follow instructions from your health care provider about changing the dosage of your current medicines, if this applies. ? Do not stop or adjust any of your medicines on your own. General instructions   Wear compression stockings as told by your health care provider.  Get up slowly from lying down or sitting positions. This gives your blood pressure a chance to adjust.  Avoid hot showers and excessive heat as directed by your health care provider.  Return to your normal activities as told by your health care provider. Ask your health care provider what activities are safe for you.  Do not use any products that contain nicotine or tobacco, such as cigarettes, e-cigarettes, and chewing tobacco. If you need help quitting, ask your health care provider.  Keep all follow-up visits as told by your health care provider. This is important. Contact a health care provider if you:  Vomit.  Have diarrhea.  Have a fever for more than 2-3 days.  Feel more thirsty than usual.  Feel weak and tired. Get help right away if you:  Have chest pain.  Have a fast or irregular heartbeat.  Develop numbness in any part of your body.  Cannot move your arms or your legs.  Have trouble speaking.  Become sweaty or feel light-headed.  Faint.  Feel short of breath.  Have trouble staying  awake.  Feel confused. Summary  Orthostatic hypotension is a sudden drop in blood pressure that happens when you quickly change positions.  Orthostatic hypotension is usually not a serious problem.  It is diagnosed by having your blood pressure taken lying down, sitting, and then standing.  It may be treated by changing your diet or adjusting your medicines. This information is not intended to replace advice given to you by your health care provider. Make sure you discuss any questions you have with your health care provider. Document Released: 08/20/2002 Document Revised: 02/23/2018 Document Reviewed: 02/23/2018 Elsevier Patient Education  [place]. Benign Positional Vertigo Vertigo is the feeling that you or your surroundings are moving when they are not. Benign positional vertigo is the most common form of vertigo. This is usually a harmless condition (benign). This condition is positional. This means that symptoms are triggered by certain movements and positions. This condition can be dangerous if it occurs while you are doing something that could cause harm to you or others. This includes activities such as driving or operating machinery. What are the causes? In many cases, the cause of this condition is not known. It may be caused by a disturbance in an area of the  inner ear that helps your brain to sense movement and balance. This disturbance can be caused by:  Viral infection (labyrinthitis).  Head injury.  Repetitive motion, such as jumping, dancing, or running. What increases the risk? You are more likely to develop this condition if:  You are a woman.  You are 56 years of age or older. What are the signs or symptoms? Symptoms of this condition usually happen when you move your head or your eyes in different directions. Symptoms may start suddenly, and usually last for less than a minute. They include:  Loss of balance and falling.  Feeling like you are  spinning or moving.  Feeling like your surroundings are spinning or moving.  Nausea and vomiting.  Blurred vision.  Dizziness.  Involuntary eye movement (nystagmus). Symptoms can be mild and cause only minor problems, or they can be severe and interfere with daily life. Episodes of benign positional vertigo may return (recur) over time. Symptoms may improve over time. How is this diagnosed? This condition may be diagnosed based on:  Your medical history.  Physical exam of the head, neck, and ears.  Tests, such as: ? MRI. ? CT scan. ? Eye movement tests. Your health care provider may ask you to change positions quickly while he or she watches you for symptoms of benign positional vertigo, such as nystagmus. Eye movement may be tested with a variety of exams that are designed to evaluate or stimulate vertigo. ? An electroencephalogram (EEG). This records electrical activity in your brain. ? Hearing tests. You may be referred to a health care provider who specializes in ear, nose, and throat (ENT) problems (otolaryngologist) or a provider who specializes in disorders of the nervous system (neurologist). How is this treated?  This condition may be treated in a session in which your health care provider moves your head in specific positions to adjust your inner ear back to normal. Treatment for this condition may take several sessions. Surgery may be needed in severe cases, but this is rare. In some cases, benign positional vertigo may resolve on its own in 2-4 weeks. Follow these instructions at home: Safety  Move slowly. Avoid sudden body or head movements or certain positions, as told by your health care provider.  Avoid driving until your health care provider says it is safe for you to do so.  Avoid operating heavy machinery until your health care provider says it is safe for you to do so.  Avoid doing any tasks that would be dangerous to you or others if vertigo occurs.  If  you have trouble walking or keeping your balance, try using a cane for stability. If you feel dizzy or unstable, sit down right away.  Return to your normal activities as told by your health care provider. Ask your health care provider what activities are safe for you. General instructions  Take over-the-counter and prescription medicines only as told by your health care provider.  Drink enough fluid to keep your urine pale yellow.  Keep all follow-up visits as told by your health care provider. This is important. Contact a health care provider if:  You have a fever.  Your condition gets worse or you develop new symptoms.  Your family or friends notice any behavioral changes.  You have nausea or vomiting that gets worse.  You have numbness or a "pins and needles" sensation. Get help right away if you:  Have difficulty speaking or moving.  Are always dizzy.  Faint.  Develop severe  headaches.  Have weakness in your legs or arms.  Have changes in your hearing or vision.  Develop a stiff neck.  Develop sensitivity to light. Summary  Vertigo is the feeling that you or your surroundings are moving when they are not. Benign positional vertigo is the most common form of vertigo.  The cause of this condition is not known. It may be caused by a disturbance in an area of the inner ear that helps your brain to sense movement and balance.  Symptoms include loss of balance and falling, feeling that you or your surroundings are moving, nausea and vomiting, and blurred vision.  This condition can be diagnosed based on symptoms, physical exam, and other tests, such as MRI, CT scan, eye movement tests, and hearing tests.  Follow safety instructions as told by your health care provider. You will also be told when to contact your health care provider in case of problems. This information is not intended to replace advice given to you by your health care provider. Make sure you discuss  any questions you have with your health care provider. Document Released: 06/07/2006 Document Revised: 02/08/2018 Document Reviewed: 02/08/2018 Elsevier Patient Education  2020 [employer].

## 2019-05-02 NOTE — Progress Notes (Signed)
Patient: Teresa Rangel MRN: [id number] DOB: 18-Feb-1941 PCP: Orma Flaming, MD     Subjective:  Chief Complaint  Patient presents with  . Dizziness    HPI: The patient is a 78 y.o. female who presents today for dizziness. Also overdue for her Htn follow up.   Dizziness: She has been getting dizzy at night time only. Can't not recall when it started, she thinks a few weeks ago. She only has this when she quickly gets up from sitting to standing or lying down and then getting up. She also takes an aleve pm (1/2 tab) at night. She does have occasionally during the day if she gets up fast. She does not have dizziness with head turning. Episodes last a few seconds or until she sits still. Feels like she is fading away so she thinks she is spinning and the world is standing still. She has baseline tinnitus. No vision changes, heart palpitations or chest pain. Has had some rhinorrhea recently.    Hypertension: Here for follow up of hypertension.  Currently on hctz, accupril and norvasc . Home readings range from 552 CEYEMVVK/12 diastolic. Takes medication as prescribed and denies any side effects. Exercise includes walking.  Weight has been stable. Denies any chest pain, headaches, shortness of breath, vision changes, swelling in lower extremities. Due for lab work. Recently it has been 113/67.    Also has some congestion and constant running nose that she would like me to look at.   Review of Systems  Constitutional: Negative for chills, fatigue and fever.  HENT: Positive for rhinorrhea. Negative for congestion, postnasal drip and sore throat.   Eyes: Negative for visual disturbance.  Respiratory: Negative for cough and shortness of breath.   Cardiovascular: Negative for chest pain, palpitations and leg swelling.  Gastrointestinal: Negative for abdominal pain, diarrhea, nausea and vomiting.  Musculoskeletal: Negative for back pain and neck pain.  Neurological: Positive for dizziness and  headaches.  Psychiatric/Behavioral: Positive for sleep disturbance.    Allergies Patient is allergic to fluzone [flu virus vaccine] and tetracyclines & related.  Past Medical History Patient  has a past medical history of History of stomach ulcers (1980), Hyperlipidemia, Hypertension, and Hypothyroidism.  Surgical History Patient  has a past surgical history that includes Thyroidectomy (2007); Appendectomy (1954); Tonsillectomy (1949); laparoscopy (2449); and Colonoscopy.  Family History Pateint's family history includes Breast cancer (age of onset: 24) in her maternal aunt.  Social History Patient  reports that she has never smoked. She has never used smokeless tobacco. She reports current alcohol use of about 1.0 standard drinks of alcohol per week. She reports that she does not use drugs.    Objective: Vitals:   05/02/19 1345  BP: 135/84  Pulse: 84  Temp: 97.7 F (36.5 C)  TempSrc: Other (Comment)  SpO2: 97%  Weight: 169 lb 6.4 oz (76.8 kg)  Height: 5\' 4"  (1.626 m)    Body mass index is 29.08 kg/m.   Orthostatic VS for the past 24 hrs:  BP- Lying Pulse- Lying BP- Sitting Pulse- Sitting BP- Standing at 0 minutes Pulse- Standing at 0 minutes  05/02/19 1405 153/79 75 135/84 84 132/84 92     Physical Exam Vitals signs reviewed.  Constitutional:      Appearance: Normal appearance.  HENT:     Head: Normocephalic and atraumatic.     Right Ear: Tympanic membrane, ear canal and external ear normal.     Left Ear: Tympanic membrane, ear canal and external ear normal.  Nose: Nose normal.     Mouth/Throat:     Mouth: Mucous membranes are moist.  Eyes:     Extraocular Movements: Extraocular movements intact.     Conjunctiva/sclera: Conjunctivae normal.     Pupils: Pupils are equal, round, and reactive to light.  Neck:     Musculoskeletal: Normal range of motion and neck supple.  Cardiovascular:     Rate and Rhythm: Normal rate and regular rhythm.     Heart sounds:  Normal heart sounds. No murmur.  Pulmonary:     Effort: Pulmonary effort is normal.     Breath sounds: Normal breath sounds.  Abdominal:     General: Abdomen is flat. Bowel sounds are normal.     Palpations: Abdomen is soft.  Neurological:     General: No focal deficit present.     Mental Status: She is alert and oriented to person, place, and time.     Cranial Nerves: No cranial nerve deficit.     Comments: +dix halpike to the left.   Psychiatric:        Mood and Affect: Mood normal.        Behavior: Behavior normal.        Assessment/plan: 1. Dizziness She has orthostasis plus possibly BPPV to the left. I do think the diphenhydramine at night could be contributing as well. Will have her stop this, take her time in position changes and make sure drinking fluids. Checking labs as well. Handout given for exercises to do for possible bppv. If not better in 2 weeks she is to let me know. Also want her to keep a BP log to make sure BP is not going too low during day and contributing. She is tricky b/c her BP is so labile.   2. Essential hypertension Blood pressure is to goal. Continue current anti-hypertensive medications. Refills given and routine lab work will be done today. Recommended routine exercise and healthy diet including DASH diet and mediterranean diet. Encouraged weight loss. F/u in 6 months. Fasting lipids next visit.   - Comprehensive metabolic panel - CBC with Differential/Platelet - Microalbumin / creatinine urine ratio  3. Hypothyroidism, unspecified type  - TSH  4. Vitamin D deficiency  - VITAMIN D 25 Hydroxy (Vit-D Deficiency, Fractures)   5. Rhinorrhea Course of flonase. Discussed how to use this medication properly.    Return in about 6 months (around 11/02/2019), or if symptoms worsen or fail to improve, for blood pressure/fasting labs .   Orma Flaming, MD [place]   05/02/2019

## 2019-05-03 ENCOUNTER — Ambulatory Visit
Admission: RE | Admit: 2019-05-03 | Discharge: 2019-05-03 | Disposition: A | Discharge status: Home / Self Care | Payer: Medicare HMO | Source: Ambulatory Visit | Attending: Family Medicine | Admitting: Family Medicine

## 2019-05-03 DIAGNOSIS — M810 Age-related osteoporosis without current pathological fracture: Secondary | ICD-10-CM | POA: Diagnosis not present

## 2019-05-03 DIAGNOSIS — Z780 Asymptomatic menopausal state: Secondary | ICD-10-CM | POA: Diagnosis not present

## 2019-05-03 DIAGNOSIS — M85852 Other specified disorders of bone density and structure, left thigh: Secondary | ICD-10-CM | POA: Diagnosis not present

## 2019-05-03 LAB — MICROALBUMIN / CREATININE URINE RATIO
Creatinine,U: 73.8 mg/dL
Microalb Creat Ratio: 2.7 mg/g (ref 0.0–30.0)
Microalb, Ur: 2 mg/dL — ABNORMAL HIGH (ref 0.0–1.9)

## 2019-05-06 ENCOUNTER — Encounter: Payer: Self-pay | Admitting: Family Medicine

## 2019-05-06 DIAGNOSIS — N390 Urinary tract infection, site not specified: Secondary | ICD-10-CM | POA: Diagnosis not present

## 2019-05-06 DIAGNOSIS — R300 Dysuria: Secondary | ICD-10-CM | POA: Diagnosis not present

## 2019-05-07 ENCOUNTER — Encounter: Payer: Self-pay | Admitting: Family Medicine

## 2019-05-07 ENCOUNTER — Other Ambulatory Visit: Payer: Self-pay | Admitting: Family Medicine

## 2019-05-07 ENCOUNTER — Telehealth: Payer: Self-pay | Admitting: Family Medicine

## 2019-05-07 ENCOUNTER — Ambulatory Visit: Payer: Medicare HMO | Admitting: Family Medicine

## 2019-05-07 ENCOUNTER — Telehealth: Payer: Self-pay

## 2019-05-07 DIAGNOSIS — M810 Age-related osteoporosis without current pathological fracture: Secondary | ICD-10-CM | POA: Insufficient documentation

## 2019-05-07 DIAGNOSIS — E039 Hypothyroidism, unspecified: Secondary | ICD-10-CM

## 2019-05-07 MED ORDER — LEVOTHYROXINE SODIUM 75 MCG PO TABS: 75.0000 ug | ORAL_TABLET | Freq: Every day | ORAL | 0 refills | Status: DC

## 2019-05-07 NOTE — Telephone Encounter (Signed)
Please get her in with me today.

## 2019-05-07 NOTE — Telephone Encounter (Signed)
Patient has been scheduled for this afternoon at 1:40pm

## 2019-05-07 NOTE — Telephone Encounter (Signed)
[facility] [place] RECORD [facility] Patient Name: Teresa Rangel Gender: Female DOB: 04/02/1941 Age: 78 Y 3 M 27 D Return Phone Number: [phone] (Primary), [phone] (Secondary) Address: City/State/Zip: [place] [address] Client [facility] [place] Client Site [facility] [place] Night Physician Orma Flaming- MD Contact Type Call Who Is Calling Patient / Member / Family / Caregiver Call Type Triage / Clinical Relationship To Patient Self Return Phone Number [phone] (Primary) Chief Complaint Abdominal Pain Reason for Call Symptomatic / Request for [place] states she has a UTI, she has chills, pelvic pain, urination pain, and difficulty urinating. Translation No Nurse Assessment Nurse: Sarajane Jews, RN, [facility] Hummingbird Date/Time Eilene Ghazi Time): 05/06/2019 10:34:36 AM Confirm and document reason for call. If symptomatic, describe symptoms. ---Caller states she has a UTI. She has chills, pelvic pain, urination pain, and difficulty urinating. She states she has been using OTC remedies but they are not helpful. She denies fever at this time. Has the patient had close contact with a person known or suspected to have the novel coronavirus illness OR traveled / lives in area with major community spread (including international travel) in the last 14 days from the onset of symptoms? * If Asymptomatic, screen for exposure and travel within the last 14 days. ---No Does the patient have any new or worsening symptoms? ---Yes Will a triage be completed? ---Yes Related visit to physician within the last 2 weeks? ---No Does the PT have any chronic conditions? (i.e. diabetes, asthma, this includes High risk factors for pregnancy, etc.) ---Yes List chronic conditions. ---HTN, Hypothyroidism Is this a behavioral health or substance abuse call?  ---No Guidelines Guideline Title Affirmed Question Affirmed Notes Nurse Date/Time Eilene Ghazi Time) Urination Pain - Female Age > 59 years Gust Rung 05/06/2019 10:37:16 AM Disp. Time Eilene Ghazi Time) Disposition Final User 05/06/2019 10:44:10 AM See PCP within 24 Hours Yes Sarajane Jews, RN, Morey Hummingbird PLEASE NOTE: All timestamps contained within this report are represented as [place] Standard Time. CONFIDENTIALTY NOTICE: This fax transmission is intended only for the addressee. It contains information that is legally privileged, confidential or otherwise protected from use or disclosure. If you are not the intended recipient, you are strictly prohibited from reviewing, disclosing, copying using or disseminating any of this information or taking any action in reliance on or regarding this information. If you have received this fax in error, please notify us immediately by telephone so that we can arrange for its return to [place]. Phone: [phone], Toll-Free: [phone], Fax: [phone] Page: 2 of 2 Call Id: [id number] [place] Disagree/Comply Comply Caller Understands Yes PreDisposition Home Care Care Advice Given Per Guideline SEE PCP WITHIN 24 HOURS: * IF OFFICE WILL BE OPEN: You need to be seen within the next 24 hours. Call your doctor (or NP/PA) when the office opens and make an appointment. REASSURANCE AND EDUCATION: This could be an urinary tract infection. You should see your PCP to be examined and tested. FLUIDS: Drink extra fluids. Drink 8-10 glasses of liquids a day (Reason: to produce a dilute, non-irritating urine). CALL BACK IF: * Fever or back pain occurs * You become worse. Referrals GO TO FACILITY UNDECIDED GO TO FACILITY UNDECIDED

## 2019-05-07 NOTE — Telephone Encounter (Signed)
Spoke to patient; appointment has been cancelled since pt was seen at Urgent Care yesterday.

## 2019-05-07 NOTE — Telephone Encounter (Signed)
Spoke with patient.  She was c/o symptoms of a UTI over the weekend (started on 8/22) and pelvic pressure/discomfort, pain and burning w/urination, chills and lower back pain.  When she awoke on 8/23, the symptoms became worse and she went to Urgent Care @ [facility].  They started her on Cipro.  I advised her to make sure that she finished her course of abx and push fluids, call us back if symptoms do not improve or if they worsen.  She verbalized understanding.

## 2019-05-08 ENCOUNTER — Other Ambulatory Visit: Payer: Self-pay | Admitting: Family Medicine

## 2019-05-09 ENCOUNTER — Other Ambulatory Visit: Payer: Self-pay | Admitting: Family Medicine

## 2019-05-09 ENCOUNTER — Encounter: Payer: Self-pay | Admitting: Family Medicine

## 2019-05-10 ENCOUNTER — Ambulatory Visit (INDEPENDENT_AMBULATORY_CARE_PROVIDER_SITE_OTHER): Payer: Medicare HMO | Admitting: Family Medicine

## 2019-05-10 ENCOUNTER — Encounter: Payer: Self-pay | Admitting: Family Medicine

## 2019-05-10 ENCOUNTER — Other Ambulatory Visit: Payer: Self-pay

## 2019-05-10 VITALS — Temp 98.8°F | Ht 64.0 in | Wt 169.0 lb

## 2019-05-10 DIAGNOSIS — M109 Gout, unspecified: Secondary | ICD-10-CM

## 2019-05-10 MED ORDER — PREDNISONE 20 MG PO TABS: 40.0000 mg | ORAL_TABLET | Freq: Every day | ORAL | 0 refills | Status: DC

## 2019-05-10 MED ORDER — ALLOPURINOL 100 MG PO TABS: 100.0000 mg | ORAL_TABLET | Freq: Every day | ORAL | 3 refills | Status: DC

## 2019-05-10 NOTE — Progress Notes (Signed)
patient: Teresa Rangel MRN: [id number] DOB: [birth year] PCP: Orma Flaming, MD   I connected with Ashok Pall on 05/10/19 at 11:30 am by a video enabled telemedicine application and verified that I am speaking with the correct person using two identifiers.  Location patient: Home Location provider: [facility], Office Persons participating in this virtual visit: Dr. Rogers Blocker and Andi Devon  I discussed the limitations of evaluation and management by telemedicine and the availability of in person appointments. The patient expressed understanding and agreed to proceed.   Subjective:  Chief Complaint  Patient presents with  . gout L foot    HPI: The patient is a 78 y.o. female who presents today for recurrent gout of her left big toe. Her last episode was back in June. She took both colchicine and steroids to treat this. She has had 2 prior episodes before this in the past year. She denies any alcohol or red meat. Does have history of HTN, age and weight. She states this started yesterday and pain rated as a 10/10. Started taking ibuprofen with some relief. Has some colchicine from Dr. Jerline Pain and has taken 2 doses about 12 hours apart. Also started cherries. Pain now an 8/10 and she can put weight on it. Can not walk far from pain. No fever/chills.    Review of Systems  Constitutional: Negative for chills, fatigue and fever.  Respiratory: Negative for cough, chest tightness and shortness of breath.   Cardiovascular: Negative for chest pain, palpitations and leg swelling.  Gastrointestinal: Negative for abdominal pain, nausea and vomiting.  Musculoskeletal: Positive for arthralgias.       C/o severe L foot pain. Lower L leg  Skin: Positive for color change.  Neurological: Negative for dizziness and headaches.    Allergies Patient is allergic to fluzone [flu virus vaccine] and tetracyclines & related.  Past Medical History Patient  has a past medical history of History of stomach  ulcers (1980), Hyperlipidemia, Hypertension, and Hypothyroidism.  Surgical History Patient  has a past surgical history that includes Thyroidectomy (2007); Appendectomy (1954); Tonsillectomy (1949); laparoscopy [id number]); and Colonoscopy.  Family History Pateint's family history includes Breast cancer (age of onset: 22) in her maternal aunt.  Social History Patient  reports that she has never smoked. She has never used smokeless tobacco. She reports current alcohol use of about 1.0 standard drinks of alcohol per week. She reports that she does not use drugs.    Objective: Vitals:   05/10/19 1111  Temp: 98.8 F (37.1 C)  TempSrc: Skin  Weight: 169 lb (76.7 kg)  Height: 5\' 4"  (1.626 m)    Body mass index is 29.01 kg/m.  Physical Exam Vitals signs reviewed.  Constitutional:      Appearance: Normal appearance.  Pulmonary:     Effort: Pulmonary effort is normal.  Skin:    Findings: Erythema (over left PCP/distal metatarsal of left great hallux) present.  Neurological:     Mental Status: She is alert.     Assessment/plan: 1. Acute gout, unspecified cause, unspecified site Colchicine bid x 3 days. Continue cherry/cherry juice. discussed could be from her HTN and age. Diet doesn't seem to play a factor for her. Since has had 4 episodes we are going to start her on allopurinol at 100mg . Discussed can not take this during a gout flair. She can start this once flair resolves. Will need to stop if has another flair. Will start lower dose and sees how she does. Also sending in prednisone to  start if no improvement in symptoms. Want to hold off since she has such labile pressures, but if no relief with colchicine she can start this.   Return if symptoms worsen or fail to improve.    Orma Flaming, MD [place]  05/10/2019

## 2019-05-10 NOTE — Progress Notes (Deleted)
Patient: Teresa Rangel MRN: [id number] DOB: September 02, 1941 PCP: Orma Flaming, MD     I connected with Ashok Pall on 05/10/19 at @CHLAPPTIME @ by a video enabled telemedicine application and verified that I am speaking with the correct person using two identifiers.  Location patient: Home Location provider:  [facility], Office Persons participating in this virtual visit: ***  I discussed the limitations of evaluation and management by telemedicine and the availability of in person appointments. The patient expressed understanding and agreed to proceed.   Subjective:  Chief Complaint  Patient presents with  . gout L foot    HPI: The patient is a 78 y.o. female who presents today for ***  Review of Systems  Allergies Patient is allergic to fluzone [flu virus vaccine] and tetracyclines & related.  Past Medical History Patient  has a past medical history of History of stomach ulcers (1980), Hyperlipidemia, Hypertension, and Hypothyroidism.  Surgical History Patient  has a past surgical history that includes Thyroidectomy (2007); Appendectomy (1954); Tonsillectomy (1949); laparoscopy [id number]); and Colonoscopy.  Family History Pateint's family history includes Breast cancer (age of onset: 48) in her maternal aunt.  Social History Patient  reports that she has never smoked. She has never used smokeless tobacco. She reports current alcohol use of about 1.0 standard drinks of alcohol per week. She reports that she does not use drugs.    Objective: Vitals:   05/10/19 1111  Temp: 98.8 F (37.1 C)  TempSrc: Skin  Weight: 169 lb (76.7 kg)  Height: 5\' 4"  (1.626 m)    Body mass index is 29.01 kg/m.  Physical Exam     Assessment/plan:      Return if symptoms worsen or fail to improve.     Orma Flaming, MD [place]  05/10/2019

## 2019-05-11 ENCOUNTER — Encounter: Payer: Self-pay | Admitting: Family Medicine

## 2019-05-14 ENCOUNTER — Encounter: Payer: Self-pay | Admitting: Family Medicine

## 2019-05-26 ENCOUNTER — Encounter: Payer: Self-pay | Admitting: Family Medicine

## 2019-05-27 ENCOUNTER — Encounter: Payer: Self-pay | Admitting: Family Medicine

## 2019-05-28 ENCOUNTER — Ambulatory Visit (INDEPENDENT_AMBULATORY_CARE_PROVIDER_SITE_OTHER): Payer: Medicare HMO | Admitting: Family Medicine

## 2019-05-28 ENCOUNTER — Other Ambulatory Visit: Payer: Self-pay

## 2019-05-28 ENCOUNTER — Encounter: Payer: Self-pay | Admitting: Family Medicine

## 2019-05-28 ENCOUNTER — Other Ambulatory Visit: Payer: Medicare HMO

## 2019-05-28 VITALS — BP 138/76 | HR 89 | Temp 98.0°F | Ht 64.0 in | Wt 169.2 lb

## 2019-05-28 DIAGNOSIS — M10062 Idiopathic gout, left knee: Secondary | ICD-10-CM

## 2019-05-28 DIAGNOSIS — R300 Dysuria: Secondary | ICD-10-CM

## 2019-05-28 DIAGNOSIS — N3000 Acute cystitis without hematuria: Secondary | ICD-10-CM | POA: Diagnosis not present

## 2019-05-28 LAB — POCT URINALYSIS DIPSTICK
Bilirubin, UA: POSITIVE
Blood, UA: POSITIVE
Glucose, UA: NEGATIVE
Ketones, UA: POSITIVE
Nitrite, UA: NEGATIVE
Protein, UA: POSITIVE — AB
Spec Grav, UA: >=1.03 — AB (ref 1.010–1.025)
Urobilinogen, UA: 0.2 E.U./dL
pH, UA: 6 (ref 5.0–8.0)

## 2019-05-28 LAB — URIC ACID: Uric Acid, Serum: 6.8 mg/dL (ref 2.4–7.0)

## 2019-05-28 MED ORDER — CIPROFLOXACIN HCL 500 MG PO TABS: 500.0000 mg | ORAL_TABLET | Freq: Two times a day (BID) | ORAL | 0 refills | Status: AC

## 2019-05-28 MED ORDER — PREMARIN 0.625 MG/GM VA CREA: 1.0000 | TOPICAL_CREAM | VAGINAL | 12 refills | Status: DC

## 2019-05-28 NOTE — Progress Notes (Signed)
Patient: Teresa Rangel MRN: [id number] DOB: 1941-04-01 PCP: Orma Flaming, MD     Subjective:  Chief Complaint  Patient presents with  . Dysuria    HPI: The patient is a 78 y.o. female who presents today for possible UTI. She states she started to have symptoms Thursday/friday of last week. She started to have dysuria, urgency and frequency. She would get chills when she urinates. She took over the counter medication azo (or similar medication). This would be her 3rd/4th urine infection in last year. She has seen urology in the past about 20 years ago for UTIs and she can't remember if anything was done and then it went away. She has had no fever/chills, no N/V, no visible blood in her urine. She does have very dry mucosa down there. She does use eucerin "down there."   Also thinks she is having a gout flair in her left knee. She has pain in her left knee. It's 75% better after icing/stopping her allopurinol and took once dose of ibuprofen.   Review of Systems  Constitutional: Positive for chills and fatigue. Negative for fever.  Respiratory: Negative for shortness of breath.   Cardiovascular: Negative for chest pain, palpitations and leg swelling.  Gastrointestinal: Negative for abdominal pain, diarrhea, nausea and vomiting.  Genitourinary: Positive for dysuria, pelvic pain and urgency. Negative for frequency.  Musculoskeletal: Negative for back pain.  Neurological: Negative for dizziness and headaches.    Allergies Patient is allergic to fluzone [flu virus vaccine] and tetracyclines & related.  Past Medical History Patient  has a past medical history of History of stomach ulcers (1980), Hyperlipidemia, Hypertension, and Hypothyroidism.  Surgical History Patient  has a past surgical history that includes Thyroidectomy (2007); Appendectomy (1954); Tonsillectomy (1949); laparoscopy [id number]); and Colonoscopy.  Family History Pateint's family history includes Breast cancer (age of  onset: 84) in her maternal aunt.  Social History Patient  reports that she has never smoked. She has never used smokeless tobacco. She reports current alcohol use of about 1.0 standard drinks of alcohol per week. She reports that she does not use drugs.    Objective: Vitals:   05/28/19 1315  BP: 138/76  Pulse: 89  Temp: 98 F (36.7 C)  TempSrc: Skin  SpO2: 95%  Weight: 169 lb 3.2 oz (76.7 kg)  Height: 5\' 4"  (1.626 m)    Body mass index is 29.04 kg/m.  Physical Exam Vitals signs reviewed.  Constitutional:      Appearance: Normal appearance. She is normal weight.  HENT:     Head: Normocephalic and atraumatic.  Cardiovascular:     Rate and Rhythm: Normal rate and regular rhythm.     Heart sounds: Normal heart sounds.  Pulmonary:     Effort: Pulmonary effort is normal.     Breath sounds: Normal breath sounds.  Abdominal:     General: Abdomen is flat. Bowel sounds are normal.     Palpations: Abdomen is soft.     Tenderness: There is no abdominal tenderness (a little uncomfortable in bladder area with palpation, but not tender). There is no right CVA tenderness or left CVA tenderness.  Musculoskeletal:     Comments: Left knee with no erythema, warmth, tenderness  Skin:    General: Skin is warm and dry.     Findings: No erythema.  Neurological:     General: No focal deficit present.     Mental Status: She is alert and oriented to person, place, and time.    Urine  dipstick shows negative for all components, positive for leukocytes, red blood cells, protein, ketones.     Assessment/plan: 1. Acute cystitis without hematuria Culture pending. Starting her on cipro for 7 day course. This could be her 4th infection in a year if culture is positive. im also going to have her stop the eucerin as this could be contributing to the infection. Going to have her start premarin cream bid to see if this helps decrease UTI risk/frequency as well. Risks discussed with topical hormone use  and she is going to proceed. No contraindication for this. F/u with culture, if positive urology referral. Also want her to see if related to sex and keep a log. Precautions given.  - Urine Culture - POCT Urinalysis Dipstick  2. Acute idiopathic gout of left knee Checking uric acid, unsure if this is truly a gout flair. I do want her on allopurinol to prevent this and would like her to start this back if knee pain resolves and uric acid is normal.  - Uric acid    Return if symptoms worsen or fail to improve.   Orma Flaming, MD [place]   05/28/2019

## 2019-05-28 NOTE — Patient Instructions (Addendum)
Premarin cream: .5g twice a week for vaginal dryness and hopefully help UTI.   Sent in cipro for UTI as well. Higher dose for 7 days.

## 2019-05-30 ENCOUNTER — Other Ambulatory Visit: Payer: Self-pay | Admitting: Family Medicine

## 2019-05-30 ENCOUNTER — Encounter: Payer: Self-pay | Admitting: Family Medicine

## 2019-05-30 LAB — URINE CULTURE
MICRO NUMBER:: 877698
SPECIMEN QUALITY:: ADEQUATE

## 2019-05-30 MED ORDER — AMOXICILLIN-POT CLAVULANATE 875-125 MG PO TABS: 1.0000 | ORAL_TABLET | Freq: Two times a day (BID) | ORAL | 0 refills | Status: DC

## 2019-06-03 ENCOUNTER — Encounter: Payer: Self-pay | Admitting: Family Medicine

## 2019-06-03 ENCOUNTER — Telehealth: Payer: Self-pay | Admitting: Family Medicine

## 2019-06-04 ENCOUNTER — Other Ambulatory Visit: Payer: Self-pay

## 2019-06-04 ENCOUNTER — Encounter: Payer: Self-pay | Admitting: Family Medicine

## 2019-06-04 ENCOUNTER — Other Ambulatory Visit: Payer: Self-pay | Admitting: Family Medicine

## 2019-06-04 DIAGNOSIS — N3000 Acute cystitis without hematuria: Secondary | ICD-10-CM

## 2019-06-04 MED ORDER — AMOXICILLIN-POT CLAVULANATE 875-125 MG PO TABS: 1.0000 | ORAL_TABLET | Freq: Two times a day (BID) | ORAL | 0 refills | Status: DC

## 2019-06-04 NOTE — Telephone Encounter (Signed)
I have no idea. Loralyn Freshwater CMA put this medication in. I never got anything and never approved it. im not sure if pharmacy sent this? She doesn't need it so please do not pick up. im not sure what happened.   D.r Rogers Blocker

## 2019-06-04 NOTE — Telephone Encounter (Signed)
Pt want to know why was she prescribe pantoprazole. Pt thought she was getting an antibiotic. Pt stated she is taking amox-clav. Pt want call back from nurse.

## 2019-06-04 NOTE — Telephone Encounter (Signed)
Spoke to patient and clarified that the pantoprazole was sent in in error.  I will call in to pharmacy and cancel this rx.  Also advised that Dr. Rogers Blocker will send in a second 5 day course of Augmentin as original rx was only for 5 days.  Patient is no longer taking the Cipro as she was instructed to stop.

## 2019-06-05 ENCOUNTER — Encounter: Payer: Self-pay | Admitting: Family Medicine

## 2019-06-05 NOTE — Telephone Encounter (Signed)
Dr. Rogers Blocker, is pt suppose to take Augmentin for 5 days only? Also is she suppose to take Pantoprazole with antibiotic. Pt is confused on why it was sent in for her. Please advise.

## 2019-06-17 ENCOUNTER — Encounter: Payer: Self-pay | Admitting: Family Medicine

## 2019-06-18 ENCOUNTER — Telehealth: Payer: Self-pay | Admitting: Family Medicine

## 2019-06-18 ENCOUNTER — Other Ambulatory Visit: Payer: Self-pay | Admitting: Family Medicine

## 2019-06-18 MED ORDER — HYDROCHLOROTHIAZIDE 25 MG PO TABS: 25.0000 mg | ORAL_TABLET | Freq: Every day | ORAL | 1 refills | Status: DC

## 2019-06-18 MED ORDER — AMLODIPINE BESYLATE 5 MG PO TABS: 5.0000 mg | ORAL_TABLET | Freq: Every day | ORAL | 1 refills | Status: DC

## 2019-06-18 NOTE — Telephone Encounter (Signed)
jen- Please call mrs. Stephens and see if she is on escitalopram 5mg  daily. I have a refill request for this but do not see this drug in her chart anywhere. Did someone else send this in for her or is she on this and we dont' know it?   Thanks!  Dr. Rogers Blocker   - I sent in her 2 blood pressure medications.

## 2019-06-18 NOTE — Telephone Encounter (Signed)
Spoke to patient.  She states that she was on Lexapro previously per Dr. Burnice Logan.  She only took medication very short term (3 mos) and no longer takes it.  She verbalized understanding that her 2 rx refills for her bp medication have been sent it.

## 2019-06-28 ENCOUNTER — Encounter

## 2019-06-28 ENCOUNTER — Ambulatory Visit: Payer: Medicare HMO | Admitting: Podiatry

## 2019-06-28 DIAGNOSIS — N3021 Other chronic cystitis with hematuria: Secondary | ICD-10-CM | POA: Diagnosis not present

## 2019-06-29 ENCOUNTER — Other Ambulatory Visit: Payer: Self-pay

## 2019-06-29 ENCOUNTER — Other Ambulatory Visit (INDEPENDENT_AMBULATORY_CARE_PROVIDER_SITE_OTHER): Payer: Medicare HMO

## 2019-06-29 DIAGNOSIS — E039 Hypothyroidism, unspecified: Secondary | ICD-10-CM

## 2019-06-29 LAB — T4, FREE: Free T4: 1 ng/dL (ref 0.60–1.60)

## 2019-06-29 LAB — TSH: TSH: 6.13 u[IU]/mL — ABNORMAL HIGH (ref 0.35–4.50)

## 2019-07-02 ENCOUNTER — Encounter: Payer: Self-pay | Admitting: Family Medicine

## 2019-07-02 ENCOUNTER — Other Ambulatory Visit: Payer: Self-pay | Admitting: Family Medicine

## 2019-07-02 DIAGNOSIS — E039 Hypothyroidism, unspecified: Secondary | ICD-10-CM

## 2019-07-02 MED ORDER — LEVOTHYROXINE SODIUM 88 MCG PO TABS: 88.0000 ug | ORAL_TABLET | Freq: Every day | ORAL | 0 refills | Status: DC

## 2019-07-19 ENCOUNTER — Encounter: Payer: Self-pay | Admitting: Family Medicine

## 2019-07-24 ENCOUNTER — Encounter: Payer: Self-pay | Admitting: Family Medicine

## 2019-07-24 ENCOUNTER — Ambulatory Visit: Payer: Self-pay

## 2019-07-24 NOTE — Telephone Encounter (Signed)
Left message on voicemail to call office.  

## 2019-07-24 NOTE — Telephone Encounter (Signed)
Do you want her to schedule a virtual visit?

## 2019-07-24 NOTE — Telephone Encounter (Signed)
Patient stating she has laryngitis. Questioning if she should test for covid. -19.  Offered patient to com to [facility] line for testing.  After hours now.  Reccomened to com in am.  Pt. Abruptly hung up states she will go to an Urgent care.  Did not get to triage Patient.

## 2019-07-25 ENCOUNTER — Encounter: Payer: Self-pay | Admitting: Family Medicine

## 2019-07-25 ENCOUNTER — Ambulatory Visit (INDEPENDENT_AMBULATORY_CARE_PROVIDER_SITE_OTHER): Payer: Medicare HMO | Admitting: Family Medicine

## 2019-07-25 ENCOUNTER — Other Ambulatory Visit: Payer: Self-pay

## 2019-07-25 VITALS — BP 146/94

## 2019-07-25 DIAGNOSIS — J060 Acute laryngopharyngitis: Secondary | ICD-10-CM | POA: Diagnosis not present

## 2019-07-25 DIAGNOSIS — Z20822 Contact with and (suspected) exposure to covid-19: Secondary | ICD-10-CM

## 2019-07-25 MED ORDER — ALBUTEROL SULFATE HFA 108 (90 BASE) MCG/ACT IN AERS: 2.0000 | INHALATION_SPRAY | Freq: Four times a day (QID) | RESPIRATORY_TRACT | 1 refills | Status: DC | PRN

## 2019-07-25 MED ORDER — PREDNISONE 20 MG PO TABS: 40.0000 mg | ORAL_TABLET | Freq: Every day | ORAL | 0 refills | Status: DC

## 2019-07-25 NOTE — Patient Instructions (Signed)
covid testing at [facility] from 10-3pm. Please go today and quarantine until results are back.   Laryngitis Laryngitis is irritation and swelling (inflammation) of your vocal cords. This condition causes symptoms such as:  A change in your voice. It may sound low and hoarse.  Loss of voice.  Coughing.  Sore throat.  Dry throat.  Stuffy nose. Depending on the cause, this condition may go away after a short time or may last for more than 3 weeks. Treatment often involves resting your voice and using medicines to soothe your throat. Follow these instructions at home: Medicines  Take over-the-counter and prescription medicines only as told by your doctor.  If you were prescribed an antibiotic medicine, take it as told by your doctor. Do not stop taking it even if you start to feel better. General instructions  Talk as little as possible. Also avoid whispering.  Write instead of talking. Do this until your voice is back to normal.  Drink enough fluid to keep your pee (urine) pale yellow.  Breathe in moist air. Use a humidifier if you live in a dry climate.  Do not use any products that have nicotine or tobacco in them, such as cigarettes and e-cigarettes. If you need help quitting, ask your doctor. Contact a doctor if:  You have a fever.  Your pain is worse.  Your symptoms do not get better in 2 weeks. Get help right away if:  You cough up blood.  You have trouble swallowing.  You have trouble breathing. Summary  Laryngitis is inflammation of your vocal cords.  This condition causes your voice to sound low and hoarse.  Rest your voice by talking as little as possible. Also avoid whispering. This information is not intended to replace advice given to you by your health care provider. Make sure you discuss any questions you have with your health care provider. Document Released: 08/19/2011 Document Revised: 08/17/2017 Document Reviewed: 08/17/2017 Elsevier  Patient Education  2020 [employer].

## 2019-07-25 NOTE — Progress Notes (Signed)
Patient: Teresa Rangel MRN: [id number] DOB: 09/03/1941 PCP: Orma Flaming, MD     I connected with Ashok Pall on 07/25/19 a 8:13am by a video enabled telemedicine application and verified that I am speaking with the correct person using two identifiers.  Location patient: Home Location provider: [facility], Office Persons participating in this virtual visit: Hannie Kniola and DR. Rogers Blocker   I discussed the limitations of evaluation and management by telemedicine and the availability of in person appointments. The patient expressed understanding and agreed to proceed.   Subjective:  Chief Complaint  Patient presents with  . Sore Throat    Began Monday  . Laryngitis    HPI: The patient is a 78 y.o. female who presents today for laryngitis that started on Monday. She also has a very bad sore throat. She is worried about bronchitis and early covid. She has no fever/chills, body aches/chills. She is a little lethargic. She doesn't really have a cough. No shortness of breath or wheezing. 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 She denies using voice excessively, singing, screaming or being out in the cold. She does have a runny nose, but has been using her flonase. No sinus pain or pressure. She has been doing sinus exercises.   Review of Systems  Constitutional: Positive for fatigue. Negative for chills and fever.  HENT: Positive for congestion, rhinorrhea, sore throat and voice change. Negative for ear pain, sinus pressure, sinus pain and sneezing.   Respiratory: Negative for cough, shortness of breath and wheezing.   Cardiovascular: Negative for chest pain and palpitations.  Gastrointestinal: Negative for abdominal pain, diarrhea and nausea.  Musculoskeletal: Negative for arthralgias and myalgias.  Neurological: Negative for headaches.    Allergies Patient is allergic to fluzone [flu virus vaccine] and tetracyclines &  related.  Past Medical History Patient  has a past medical history of History of stomach ulcers (1980), Hyperlipidemia, Hypertension, and Hypothyroidism.  Surgical History Patient  has a past surgical history that includes Thyroidectomy (2007); Appendectomy (1954); Tonsillectomy (1949); laparoscopy [id number]); and Colonoscopy.  Family History Pateint's family history includes Breast cancer (age of onset: 8) in her maternal aunt.  Social History Patient  reports that she has never smoked. She has never used smokeless tobacco. She reports current alcohol use of about 1.0 standard drinks of alcohol per week. She reports that she does not use drugs.    Objective: Vitals:   07/25/19 0810  BP: (!) 146/94    There is no height or weight on file to calculate BMI.  Physical Exam Vitals signs reviewed.  Constitutional:      General: She is not in acute distress.    Appearance: She is well-developed. She is not ill-appearing.     Comments: hoarse  HENT:     Head: Normocephalic and atraumatic.     Comments: Minimal ttp over maxillary sinuses when she presses Neck:     Musculoskeletal: Normal range of motion.  Pulmonary:     Effort: Pulmonary effort is normal.  Neurological:     General: No focal deficit present.     Mental Status: She is alert and oriented to person, place, and time.  Psychiatric:        Mood and Affect: Mood normal.        Behavior: Behavior normal.        Assessment/plan: 1. Sore throat and laryngitis Discussed conservative therapy for laryngitis  and sore throat. No indication for bacterial source/infection or antibiotics at this time. We will do vocal rest, cool mist humidifier, fluids, warm salt water gurgles. 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 Sending her for covid testing since at school/age/sore throat and congestion.  Discussed need to quarantine until this is back. precautions given for worsening symptoms.  - Novel Coronavirus, NAA ([facility]); Future      Return if symptoms worsen or fail to improve.  Records requested if needed. Time spent with patient: 15 minutes, of which >50% was spent in obtaining information about her symptoms, reviweing her previous labs, evaluations, and treatments, counseling her about her conditions (please see discussed topics above), and developing a plan to further investigate it; she had a number of questions which I addressed.    Orma Flaming, MD [place]  07/25/2019

## 2019-07-28 LAB — NOVEL CORONAVIRUS, NAA: SARS-CoV-2, NAA: NOT DETECTED

## 2019-07-30 ENCOUNTER — Encounter: Payer: Self-pay | Admitting: Family Medicine

## 2019-08-04 DIAGNOSIS — R69 Illness, unspecified: Secondary | ICD-10-CM | POA: Diagnosis not present

## 2019-08-13 ENCOUNTER — Encounter: Payer: Self-pay | Admitting: Family Medicine

## 2019-08-15 NOTE — Telephone Encounter (Signed)
Left voicemail for the patient to call to schedule pneumonia shot and lab appointment

## 2019-08-16 DIAGNOSIS — N3021 Other chronic cystitis with hematuria: Secondary | ICD-10-CM | POA: Diagnosis not present

## 2019-08-17 ENCOUNTER — Encounter: Payer: Self-pay | Admitting: Family Medicine

## 2019-08-17 ENCOUNTER — Ambulatory Visit: Payer: Medicare HMO

## 2019-08-17 ENCOUNTER — Other Ambulatory Visit: Payer: Self-pay | Admitting: Family Medicine

## 2019-08-17 ENCOUNTER — Other Ambulatory Visit (INDEPENDENT_AMBULATORY_CARE_PROVIDER_SITE_OTHER): Payer: Medicare HMO

## 2019-08-17 ENCOUNTER — Other Ambulatory Visit: Payer: Self-pay

## 2019-08-17 DIAGNOSIS — E039 Hypothyroidism, unspecified: Secondary | ICD-10-CM

## 2019-08-17 LAB — T4, FREE: Free T4: 1.14 ng/dL (ref 0.60–1.60)

## 2019-08-17 LAB — TSH: TSH: 1.18 u[IU]/mL (ref 0.35–4.50)

## 2019-08-17 MED ORDER — LEVOTHYROXINE SODIUM 88 MCG PO TABS: 88.0000 ug | ORAL_TABLET | Freq: Every day | ORAL | 0 refills | Status: DC

## 2019-08-17 MED ORDER — LEVOTHYROXINE SODIUM 88 MCG PO TABS: 88.0000 ug | ORAL_TABLET | Freq: Every day | ORAL | 3 refills | Status: DC

## 2019-08-17 NOTE — Telephone Encounter (Signed)
Medication: levothyroxine (SYNTHROID) 88 MCG tablet [id number]   Has the patient contacted their pharmacy? Yes  (Agent: If no, request that the patient contact the pharmacy for the refill.) (Agent: If yes, when and what did the pharmacy advise?)  Preferred Pharmacy (with phone number or street name):   [facility], [place] [phone] (Phone) [phone] (Fax)    Agent: Please be advised that RX refills may take up to 3 business days. We ask that you follow-up with your pharmacy.

## 2019-08-17 NOTE — Telephone Encounter (Signed)
Requested medication (s) are due for refill today: yes  Requested medication (s) are on the active medication list: yes  Last refill:  07/02/2019  Future visit scheduled: no  Notes to clinic: review for refill Looks like dose was increased and labs were gonna be drawn   Requested Prescriptions  Pending Prescriptions Disp Refills   levothyroxine (SYNTHROID) 88 MCG tablet 60 tablet 0    Sig: Take 1 tablet (88 mcg total) by mouth daily before breakfast. Repeat labs in 6-8 weeks     Endocrinology:  Hypothyroid Agents Failed - 08/17/2019  9:14 AM      Failed - TSH needs to be rechecked within 3 months after an abnormal result. Refill until TSH is due.      Failed - TSH in normal range and within 360 days    TSH  Date Value Ref Range Status  06/29/2019 6.13 (H) 0.35 - 4.50 uIU/mL Final         Passed - Valid encounter within last 12 months    Recent Outpatient Visits          3 weeks ago Sore throat and laryngitis   [facility], Ebony Hail, MD   2 months ago Acute idiopathic gout of left knee   [facility] Arlie Solomons, Ebony Hail, MD   3 months ago Acute gout, unspecified cause, unspecified site   [place] [facility]fe, Allison, MD   3 months ago Essential hypertension   [place] Wolfe, Ebony Hail, MD   5 months ago Gastroesophageal reflux disease with esophagitis   [facility] [address], Algis Greenhouse, MD

## 2019-08-17 NOTE — Telephone Encounter (Signed)
See note

## 2019-08-23 DIAGNOSIS — D497 Neoplasm of unspecified behavior of endocrine glands and other parts of nervous system: Secondary | ICD-10-CM

## 2019-08-23 DIAGNOSIS — N3021 Other chronic cystitis with hematuria: Secondary | ICD-10-CM | POA: Diagnosis not present

## 2019-08-23 DIAGNOSIS — N3020 Other chronic cystitis without hematuria: Secondary | ICD-10-CM | POA: Diagnosis not present

## 2019-08-23 HISTORY — DX: Neoplasm of unspecified behavior of endocrine glands and other parts of nervous system: D49.7

## 2019-09-08 ENCOUNTER — Other Ambulatory Visit: Payer: Self-pay | Admitting: Family Medicine

## 2019-09-20 DIAGNOSIS — N3021 Other chronic cystitis with hematuria: Secondary | ICD-10-CM | POA: Diagnosis not present

## 2019-09-20 DIAGNOSIS — N3020 Other chronic cystitis without hematuria: Secondary | ICD-10-CM | POA: Insufficient documentation

## 2019-10-09 ENCOUNTER — Encounter: Payer: Self-pay | Admitting: Family Medicine

## 2019-10-09 DIAGNOSIS — N3020 Other chronic cystitis without hematuria: Secondary | ICD-10-CM

## 2019-10-22 ENCOUNTER — Ambulatory Visit: Payer: Medicare HMO | Attending: Internal Medicine

## 2019-10-22 DIAGNOSIS — Z23 Encounter for immunization: Secondary | ICD-10-CM | POA: Insufficient documentation

## 2019-10-22 NOTE — Progress Notes (Signed)
   Covid-19 Vaccination Clinic  Name:  Teresa Rangel    MRN: [id number] DOB: [birth year]  10/22/2019  [name] was observed post Covid-19 immunization for 15 minutes without incidence. She was provided with Vaccine Information Sheet and instruction to access the V-Safe system.   [name] was instructed to call 911 with any severe reactions post vaccine: Marland Kitchen Difficulty breathing  . Swelling of your face and throat  . A fast heartbeat  . A bad rash all over your body  . Dizziness and weakness    Immunizations Administered    Name Date Dose VIS Date Route   Pfizer COVID-19 Vaccine 10/22/2019  2:54 PM 0.3 mL 08/24/2019 Intramuscular   Manufacturer: [place]   Lot: [id number]   [place]: [id number]

## 2019-11-16 ENCOUNTER — Ambulatory Visit: Payer: Medicare HMO | Attending: Internal Medicine

## 2019-11-16 DIAGNOSIS — Z23 Encounter for immunization: Secondary | ICD-10-CM | POA: Insufficient documentation

## 2019-11-16 NOTE — Progress Notes (Signed)
   Covid-19 Vaccination Clinic  Name:  Teresa Rangel    MRN: [id number] DOB: [birth year]  11/16/2019  [name] was observed post Covid-19 immunization for 15 minutes without incident. She was provided with Vaccine Information Sheet and instruction to access the V-Safe system.   [name] was instructed to call 911 with any severe reactions post vaccine: Marland Kitchen Difficulty breathing  . Swelling of face and throat  . A fast heartbeat  . A bad rash all over body  . Dizziness and weakness   Immunizations Administered    Name Date Dose VIS Date Route   Pfizer COVID-19 Vaccine 11/16/2019 12:57 PM 0.3 mL 08/24/2019 Intramuscular   Manufacturer: [place]   Lot: [id number]   [place]: [id number]

## 2019-12-07 ENCOUNTER — Other Ambulatory Visit: Payer: Self-pay | Admitting: Family Medicine

## 2019-12-10 ENCOUNTER — Encounter: Payer: Self-pay | Admitting: Family Medicine

## 2020-01-18 ENCOUNTER — Other Ambulatory Visit: Payer: Self-pay | Admitting: Family Medicine

## 2020-01-18 DIAGNOSIS — Z1231 Encounter for screening mammogram for malignant neoplasm of breast: Secondary | ICD-10-CM

## 2020-02-14 DIAGNOSIS — H33302 Unspecified retinal break, left eye: Secondary | ICD-10-CM | POA: Diagnosis not present

## 2020-02-19 DIAGNOSIS — H35372 Puckering of macula, left eye: Secondary | ICD-10-CM | POA: Diagnosis not present

## 2020-02-19 DIAGNOSIS — H353112 Nonexudative age-related macular degeneration, right eye, intermediate dry stage: Secondary | ICD-10-CM | POA: Diagnosis not present

## 2020-02-19 DIAGNOSIS — H43821 Vitreomacular adhesion, right eye: Secondary | ICD-10-CM | POA: Diagnosis not present

## 2020-02-19 DIAGNOSIS — H353221 Exudative age-related macular degeneration, left eye, with active choroidal neovascularization: Secondary | ICD-10-CM | POA: Diagnosis not present

## 2020-02-21 ENCOUNTER — Other Ambulatory Visit: Payer: Self-pay

## 2020-02-21 ENCOUNTER — Ambulatory Visit
Admission: RE | Admit: 2020-02-21 | Discharge: 2020-02-21 | Disposition: A | Discharge status: Home / Self Care | Payer: Medicare HMO | Source: Ambulatory Visit | Attending: Family Medicine | Admitting: Family Medicine

## 2020-02-21 DIAGNOSIS — Z1231 Encounter for screening mammogram for malignant neoplasm of breast: Secondary | ICD-10-CM

## 2020-02-25 DIAGNOSIS — H353221 Exudative age-related macular degeneration, left eye, with active choroidal neovascularization: Secondary | ICD-10-CM | POA: Diagnosis not present

## 2020-02-26 ENCOUNTER — Ambulatory Visit: Payer: Medicare HMO | Admitting: Family Medicine

## 2020-02-26 DIAGNOSIS — Z0289 Encounter for other administrative examinations: Secondary | ICD-10-CM

## 2020-02-28 ENCOUNTER — Ambulatory Visit: Payer: Medicare HMO | Admitting: Podiatry

## 2020-02-28 ENCOUNTER — Other Ambulatory Visit: Payer: Self-pay

## 2020-02-28 ENCOUNTER — Ambulatory Visit (INDEPENDENT_AMBULATORY_CARE_PROVIDER_SITE_OTHER): Payer: Medicare HMO

## 2020-02-28 ENCOUNTER — Encounter: Payer: Self-pay | Admitting: Podiatry

## 2020-02-28 DIAGNOSIS — G5761 Lesion of plantar nerve, right lower limb: Secondary | ICD-10-CM | POA: Diagnosis not present

## 2020-02-28 DIAGNOSIS — M778 Other enthesopathies, not elsewhere classified: Secondary | ICD-10-CM

## 2020-02-28 DIAGNOSIS — G5781 Other specified mononeuropathies of right lower limb: Secondary | ICD-10-CM

## 2020-03-01 NOTE — Progress Notes (Signed)
She presents today for pain across the dorsal aspect of the forefoot right.  States that the last week this area has been swollen and sore discolored and tender she is denies any injuries to the right foot she states that it feels better today than it did she had previous surgery of the first metatarsophalangeal joint and the second metatarsophalangeal joint which are still numb she says.  Objective: Vital signs are stable alert and oriented x3.  Radiographic evaluation today demonstrates internal fixation to the first and second metatarsals are intact mild bony hypertrophy of the second metatarsal head.  She does have some loss of sensation on palpation of the first and second metatarsal phalangeal joint area.  She has pain on palpation to the third interdigital space of the right foot with a palpable Mulder's click consistent with a neuroma.  Assessment: Neuroma.  Plan: She will follow up with [place] on an as-needed basis.

## 2020-03-06 ENCOUNTER — Other Ambulatory Visit: Payer: Self-pay | Admitting: Family Medicine

## 2020-03-24 DIAGNOSIS — H353112 Nonexudative age-related macular degeneration, right eye, intermediate dry stage: Secondary | ICD-10-CM | POA: Diagnosis not present

## 2020-03-24 DIAGNOSIS — H353221 Exudative age-related macular degeneration, left eye, with active choroidal neovascularization: Secondary | ICD-10-CM | POA: Diagnosis not present

## 2020-04-21 DIAGNOSIS — H353221 Exudative age-related macular degeneration, left eye, with active choroidal neovascularization: Secondary | ICD-10-CM | POA: Diagnosis not present

## 2020-04-21 DIAGNOSIS — H353112 Nonexudative age-related macular degeneration, right eye, intermediate dry stage: Secondary | ICD-10-CM | POA: Diagnosis not present

## 2020-05-02 ENCOUNTER — Ambulatory Visit (INDEPENDENT_AMBULATORY_CARE_PROVIDER_SITE_OTHER): Payer: Medicare HMO

## 2020-05-02 DIAGNOSIS — Z0000 Encounter for general adult medical examination without abnormal findings: Secondary | ICD-10-CM

## 2020-05-02 DIAGNOSIS — D2371 Other benign neoplasm of skin of right lower limb, including hip: Secondary | ICD-10-CM | POA: Diagnosis not present

## 2020-05-02 NOTE — Patient Instructions (Addendum)
Teresa Rangel , Thank you for taking time to come for your Medicare Wellness Visit. I appreciate your ongoing commitment to your health goals. Please review the following plan we discussed and let me know if I can assist you in the future.   Screening recommendations/referrals: Colonoscopy: No longer Required Mammogram: No longer required Bone Density: Done 05/03/19 Recommended yearly ophthalmology/optometry visit for glaucoma screening and checkup Recommended yearly dental visit for hygiene and checkup  Vaccinations: Influenza vaccine: Up to date Pneumococcal vaccine: Up to date Tdap vaccine: Up to date Shingles vaccine: Shingrix discussed. Please contact your pharmacy for coverage information.    Covid-19:Completed 2/8 & 11/16/19  Advanced directives: Please bring a copy of your health care power of attorney and living will to the office at your convenience.  Conditions/risks identified: Lose weight and exercise  Next appointment: Follow up in one year for your annual wellness visit    Preventive Care 65 Years and Older, Female Preventive care refers to lifestyle choices and visits with your health care provider that can promote health and wellness. What does preventive care include?  A yearly physical exam. This is also called an annual well check.  Dental exams once or twice a year.  Routine eye exams. Ask your health care provider how often you should have your eyes checked.  Personal lifestyle choices, including:  Daily care of your teeth and gums.  Regular physical activity.  Eating a healthy diet.  Avoiding tobacco and drug use.  Limiting alcohol use.  Practicing safe sex.  Taking low-dose aspirin every day.  Taking vitamin and mineral supplements as recommended by your health care provider. What happens during an annual well check? The services and screenings done by your health care provider during your annual well check will depend on your age, overall health,  lifestyle risk factors, and family history of disease. Counseling  Your health care provider may ask you questions about your:  Alcohol use.  Tobacco use.  Drug use.  Emotional well-being.  Home and relationship well-being.  Sexual activity.  Eating habits.  History of falls.  Memory and ability to understand (cognition).  Work and work Statistician.  Reproductive health. Screening  You may have the following tests or measurements:  Height, weight, and BMI.  Blood pressure.  Lipid and cholesterol levels. These may be checked every 5 years, or more frequently if you are over 70 years old.  Skin check.  Lung cancer screening. You may have this screening every year starting at age 94 if you have a 30-pack-year history of smoking and currently smoke or have quit within the past 15 years.  Fecal occult blood test (FOBT) of the stool. You may have this test every year starting at age 71.  Flexible sigmoidoscopy or colonoscopy. You may have a sigmoidoscopy every 5 years or a colonoscopy every 10 years starting at age 22.  Hepatitis C blood test.  Hepatitis B blood test.  Sexually transmitted disease (STD) testing.  Diabetes screening. This is done by checking your blood sugar (glucose) after you have not eaten for a while (fasting). You may have this done every 1-3 years.  Bone density scan. This is done to screen for osteoporosis. You may have this done starting at age 49.  Mammogram. This may be done every 1-2 years. Talk to your health care provider about how often you should have regular mammograms. Talk with your health care provider about your test results, treatment options, and if necessary, the need for more  tests. Vaccines  Your health care provider may recommend certain vaccines, such as:  Influenza vaccine. This is recommended every year.  Tetanus, diphtheria, and acellular pertussis (Tdap, Td) vaccine. You may need a Td booster every 10 years.  Zoster  vaccine. You may need this after age 82.  Pneumococcal 13-valent conjugate (PCV13) vaccine. One dose is recommended after age 26.  Pneumococcal polysaccharide (PPSV23) vaccine. One dose is recommended after age 35. Talk to your health care provider about which screenings and vaccines you need and how often you need them. This information is not intended to replace advice given to you by your health care provider. Make sure you discuss any questions you have with your health care provider. Document Released: 09/26/2015 Document Revised: 05/19/2016 Document Reviewed: 07/01/2015 Elsevier Interactive Patient Education  2017 [place] Prevention in the Home Falls can cause injuries. They can happen to people of all ages. There are many things you can do to make your home safe and to help prevent falls. What can I do on the outside of my home?  Regularly fix the edges of walkways and driveways and fix any cracks.  Remove anything that might make you trip as you walk through a door, such as a raised step or threshold.  Trim any bushes or trees on the path to your home.  Use bright outdoor lighting.  Clear any walking paths of anything that might make someone trip, such as rocks or tools.  Regularly check to see if handrails are loose or broken. Make sure that both sides of any steps have handrails.  Any raised decks and porches should have guardrails on the edges.  Have any leaves, snow, or ice cleared regularly.  Use sand or salt on walking paths during winter.  Clean up any spills in your garage right away. This includes oil or grease spills. What can I do in the bathroom?  Use night lights.  Install grab bars by the toilet and in the tub and shower. Do not use towel bars as grab bars.  Use non-skid mats or decals in the tub or shower.  If you need to sit down in the shower, use a plastic, non-slip stool.  Keep the floor dry. Clean up any water that spills on the  floor as soon as it happens.  Remove soap buildup in the tub or shower regularly.  Attach bath mats securely with double-sided non-slip rug tape.  Do not have throw rugs and other things on the floor that can make you trip. What can I do in the bedroom?  Use night lights.  Make sure that you have a light by your bed that is easy to reach.  Do not use any sheets or blankets that are too big for your bed. They should not hang down onto the floor.  Have a firm chair that has side arms. You can use this for support while you get dressed.  Do not have throw rugs and other things on the floor that can make you trip. What can I do in the kitchen?  Clean up any spills right away.  Avoid walking on wet floors.  Keep items that you use a lot in easy-to-reach places.  If you need to reach something above you, use a strong step stool that has a grab bar.  Keep electrical cords out of the way.  Do not use floor polish or wax that makes floors slippery. If you must use wax, use non-skid floor  wax.  Do not have throw rugs and other things on the floor that can make you trip. What can I do with my stairs?  Do not leave any items on the stairs.  Make sure that there are handrails on both sides of the stairs and use them. Fix handrails that are broken or loose. Make sure that handrails are as long as the stairways.  Check any carpeting to make sure that it is firmly attached to the stairs. Fix any carpet that is loose or worn.  Avoid having throw rugs at the top or bottom of the stairs. If you do have throw rugs, attach them to the floor with carpet tape.  Make sure that you have a light switch at the top of the stairs and the bottom of the stairs. If you do not have them, ask someone to add them for you. What else can I do to help prevent falls?  Wear shoes that:  Do not have high heels.  Have rubber bottoms.  Are comfortable and fit you well.  Are closed at the toe. Do not wear  sandals.  If you use a stepladder:  Make sure that it is fully opened. Do not climb a closed stepladder.  Make sure that both sides of the stepladder are locked into place.  Ask someone to hold it for you, if possible.  Clearly mark and make sure that you can see:  Any grab bars or handrails.  First and last steps.  Where the edge of each step is.  Use tools that help you move around (mobility aids) if they are needed. These include:  Canes.  Walkers.  Scooters.  Crutches.  Turn on the lights when you go into a dark area. Replace any light bulbs as soon as they burn out.  Set up your furniture so you have a clear path. Avoid moving your furniture around.  If any of your floors are uneven, fix them.  If there are any pets around you, be aware of where they are.  Review your medicines with your doctor. Some medicines can make you feel dizzy. This can increase your chance of falling. Ask your doctor what other things that you can do to help prevent falls. This information is not intended to replace advice given to you by your health care provider. Make sure you discuss any questions you have with your health care provider. Document Released: 06/26/2009 Document Revised: 02/05/2016 Document Reviewed: 10/04/2014 Elsevier Interactive Patient Education  2017 [employer].

## 2020-05-02 NOTE — Progress Notes (Signed)
Virtual Visit via Telephone Note  I connected with  Teresa Rangel on 05/02/20 at 10:15 AM EDT by telephone and verified that I am speaking with the correct person using two identifiers.  Medicare Annual Wellness visit completed telephonically due to Covid-19 pandemic.   Persons participating in this call: This Health Coach and this patient.   Location: Patient: Home Provider: Office   I discussed the limitations, risks, security and privacy concerns of performing an evaluation and management service by telephone and the availability of in person appointments. The patient expressed understanding and agreed to proceed.  Unable to perform video visit due to video visit attempted and failed and/or patient does not have video capability.   Some vital signs may be absent or patient reported.   [name], LPN    Subjective:   Teresa Rangel is a 79 y.o. female who presents for Medicare Annual (Subsequent) preventive examination.  Review of Systems     Cardiac Risk Factors include: advanced age (>61men, >76 women);hypertension;dyslipidemia     Objective:    There were no vitals filed for this visit. There is no height or weight on file to calculate BMI.  Advanced Directives 05/02/2020 04/30/2019 07/04/2018 11/30/2015  Does Patient Have a Medical Advance Directive? Yes No No No  Type of Paramedic of [place];Living will - - -  Copy of [place] in Chart? No - copy requested - - -  Would patient like information on creating a medical advance directive? - No - Patient declined No - Patient declined No - patient declined information    Current Medications (verified) Outpatient Encounter Medications as of 05/02/2020  Medication Sig  . allopurinol (ZYLOPRIM) 100 MG tablet TAKE ONE TABLET BY MOUTH DAILY DO NOT TAKE DURING GOUT FLARE  . amLODipine (NORVASC) 5 MG tablet TAKE ONE TABLET BY MOUTH DAILY  . cholecalciferol (VITAMIN  D) 1000 UNITS tablet Take 2,000 Units by mouth daily.  . colchicine 0.6 MG tablet Take 0.6 mg by mouth daily.  Marland Kitchen conjugated estrogens (PREMARIN) vaginal cream Place 1 Applicatorful vaginally every 3 (three) days.  . fluticasone (FLONASE) 50 MCG/ACT nasal spray Place 2 sprays into both nostrils daily.  . hydrochlorothiazide (HYDRODIURIL) 25 MG tablet TAKE ONE TABLET BY MOUTH DAILY  . levothyroxine (SYNTHROID) 88 MCG tablet Take 1 tablet (88 mcg total) by mouth daily before breakfast.  . quinapril (ACCUPRIL) 40 MG tablet TAKE ONE TABLET BY MOUTH EVERY NIGHT AT BEDTIME  . vitamin C (ASCORBIC ACID) 500 MG tablet Take 500 mg by mouth daily.  Marland Kitchen albuterol (VENTOLIN HFA) 108 (90 Base) MCG/ACT inhaler Inhale 2 puffs into the lungs every 6 (six) hours as needed for wheezing or shortness of breath. (Patient not taking: Reported on 05/02/2020)  . pantoprazole (PROTONIX) 40 MG tablet TAKE ONE TABLET BY MOUTH DAILY (Patient not taking: Reported on 05/02/2020)  . pravastatin (PRAVACHOL) 20 MG tablet Take 1 tablet (20 mg total) by mouth daily. (Patient not taking: Reported on 05/02/2020)   No facility-administered encounter medications on file as of 05/02/2020.    Allergies (verified) Fluzone [flu virus vaccine] and Tetracyclines & related   History: Past Medical History:  Diagnosis Date  . History of stomach ulcers 1980  . Hyperlipidemia   . Hypertension   . Hypothyroidism   . 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 APPENDECTOMY  1954  . COLONOSCOPY    .  LAPAROSCOPY  1982  . THYROIDECTOMY  2007  . TONSILLECTOMY  1949   Family History  Problem Relation Age of Onset  . Breast cancer Maternal Aunt 105   Social History   Socioeconomic History  . Marital status: Married    Spouse name: Not on file  . Number of children: Not on file  . Years of education: Not on file  . Highest education level: Not on file  Occupational History  . Not on  file  Tobacco Use  . Smoking status: Never Smoker  . Smokeless tobacco: Never Used  Substance and Sexual Activity  . Alcohol use: Yes    Alcohol/week: 1.0 standard drink    Types: 1 Glasses of wine per week    Comment: occassional  . Drug use: No  . Sexual activity: Yes    Partners: Male  Other Topics Concern  . 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 Difficulty of Paying Living Expenses: Not hard at all  Food Insecurity: No Food Insecurity  . Worried About Charity fundraiser in the Last Year: Never true  . Ran Out of Food in the Last Year: Never true  Transportation Needs: No Transportation Needs  . Lack of Transportation (Medical): No  . Lack of Transportation (Non-Medical): No  Physical Activity: Inactive  . Days of Exercise per Week: 0 days  . Minutes of Exercise per Session: 0 min  Stress: No Stress Concern Present  . Feeling of Stress : Not at all  Social Connections: Socially Integrated  . Frequency of Communication with Friends and Family: Three times a week  . Frequency of Social Gatherings with Friends and Family: Three times a week  . Attends Religious Services: More than 4 times per year  . Active Member of Clubs or Organizations: Yes  . Attends Archivist Meetings: 1 to 4 times per year  . Marital Status: Married    Tobacco Counseling Counseling given: Not Answered   Clinical Intake:  Pre-visit preparation completed: Yes  Pain : No/denies pain     BMI - recorded: 27.3 Nutritional Status: BMI 25 -29 Overweight Nutritional Risks: None Diabetes: No  How often do you need to have someone help you when you read instructions, pamphlets, or other written materials from your doctor or pharmacy?: 1 - Never  Diabetic?No  Interpreter Needed?: No  Information entered by :: [name], LPN   Activities of Daily Living In  your present state of health, do you have any difficulty performing the following activities: 05/02/2020  Hearing? N  Vision? N  Difficulty concentrating or making decisions? N  Walking or climbing stairs? N  Dressing or bathing? N  Doing errands, shopping? N  Preparing Food and eating ? N  Using the Toilet? N  In the past six months, have you accidently leaked urine? N  Do you have problems with loss of bowel control? N  Managing your Medications? N  Managing your Finances? N  Housekeeping or managing your Housekeeping? N  Some recent data might be hidden    Patient Care Team: Orma Flaming, MD as PCP - General (Family Medicine) McKenzie, Candee Furbish, MD as Consulting Physician (Urology) Princess Bruins, MD as Referring Physician (Ophthalmology)  Indicate any recent Medical Services you may have received from other than [facility] providers in the past year (date may be approximate).  Assessment:   This is a routine wellness examination for Teresa Rangel.  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 Patient Stated     Lose 5 lbs and be more active      Depression Screen PHQ 2/9 Scores 05/02/2020 04/30/2019 12/19/2017 11/19/2016 07/11/2015 04/21/2015 02/26/2014  PHQ - 2 Score 0 0 0 0 0 0 0    Fall Risk Fall Risk  05/02/2020 05/02/2019 04/30/2019 12/19/2017 11/19/2016  Falls in the past year? 0 1 0 No No  Number falls in past yr: 0 0 0 - -  Injury with Fall? 0 0 0 - -  Risk for fall due to : Impaired vision - - - -  Follow up Falls  prevention discussed - Education provided - -    Any stairs in or around the home? Yes  If so, are there any without handrails? No  Home free of loose throw rugs in walkways, pet beds, electrical cords, etc? Yes  Adequate lighting in your home to reduce risk of falls? Yes   ASSISTIVE DEVICES UTILIZED TO PREVENT FALLS:  Life alert? No  Use of a cane, walker or w/c? No  Grab bars in the bathroom? Yes  Shower chair or bench in shower? Yes  Elevated toilet seat or a handicapped toilet? No   TIMED UP AND GO:  Was the test performed? No .    Cognitive Function:     6CIT Screen 05/02/2020  What Year? 0 points  What month? 0 points  Count back from 20 0 points  Months in reverse 0 points  Repeat phrase 0 points    Immunizations Immunization History  Administered Date(s) Administered  . Influenza, High Dose Seasonal PF 08/04/2019  . PFIZER SARS-COV-2 Vaccination 10/22/2019, 11/16/2019  . Pneumococcal Conjugate-13 07/07/2018  . Pneumococcal Polysaccharide-23 06/07/2013  . Tetanus 06/07/2013    TDAP status: Up to date Flu Vaccine status: Up to date Pneumococcal vaccine status: Up to date Covid-19 vaccine status: Completed vaccines  Qualifies for Shingles Vaccine? Yes   Zostavax completed No   Shingrix Completed?: No.    Education has been provided regarding the importance of this vaccine. Patient has been advised to call insurance company to determine out of pocket expense if they have not yet received this vaccine. Advised may also receive vaccine at local pharmacy or Health Dept. Verbalized acceptance and understanding.  Screening Tests Health Maintenance  Topic Date Due  . INFLUENZA VACCINE  04/13/2020  . MAMMOGRAM  02/20/2021  . TETANUS/TDAP  06/08/2023  . DEXA SCAN  Completed  . COVID-19 Vaccine  Completed  . PNA vac Low Risk Adult  Completed    Health Maintenance  Health Maintenance Due  Topic Date Due  . INFLUENZA VACCINE  04/13/2020    Colorectal  cancer screening: No longer required.  Mammogram status: No longer required.  Bone Density status: Completed 05/03/19. Results reflect: Bone density results: OSTEOPOROSIS. Repeat every 2 years.  Additional Screening:    Vision Screening: Recommended annual ophthalmology exams for early detection of glaucoma and other disorders of the eye. Is the patient up to date with their annual eye exam?  Yes  Who is the provider or what is the name of the office in which the patient attends annual eye exams? Dr Robyne Peers with [facility]   Dental Screening: Recommended annual dental exams for proper oral hygiene  Community Resource Referral / Chronic Care Management: CRR required this visit?  No   CCM required this visit?  No      Plan:     I have personally reviewed and noted the following in the patient's chart:   . Medical and social history . Use of alcohol, tobacco or illicit drugs  . Current medications and supplements . Functional ability and status . Nutritional status . Physical activity . Advanced directives . List of other physicians . Hospitalizations, surgeries, and ER visits in previous 12 months . Vitals . Screenings to include cognitive, depression, and falls . Referrals and appointments  In addition, I have reviewed and discussed with patient certain preventive protocols, quality metrics, and best practice recommendations. A written personalized care plan for preventive services as well as general preventive health recommendations were provided to patient.     [name], LPN   [id number]   Nurse Notes: None

## 2020-05-03 ENCOUNTER — Encounter: Payer: Self-pay | Admitting: Family Medicine

## 2020-05-04 ENCOUNTER — Other Ambulatory Visit: Payer: Self-pay

## 2020-05-04 MED ORDER — PRAVASTATIN SODIUM 20 MG PO TABS: 20.0000 mg | ORAL_TABLET | Freq: Every day | ORAL | 4 refills | Status: DC

## 2020-05-20 DIAGNOSIS — H353221 Exudative age-related macular degeneration, left eye, with active choroidal neovascularization: Secondary | ICD-10-CM | POA: Diagnosis not present

## 2020-05-20 DIAGNOSIS — H353112 Nonexudative age-related macular degeneration, right eye, intermediate dry stage: Secondary | ICD-10-CM | POA: Diagnosis not present

## 2020-05-20 DIAGNOSIS — H35372 Puckering of macula, left eye: Secondary | ICD-10-CM | POA: Diagnosis not present

## 2020-05-20 DIAGNOSIS — H43821 Vitreomacular adhesion, right eye: Secondary | ICD-10-CM | POA: Diagnosis not present

## 2020-05-29 ENCOUNTER — Other Ambulatory Visit: Payer: Self-pay

## 2020-05-29 ENCOUNTER — Encounter: Payer: Self-pay | Admitting: Family Medicine

## 2020-05-29 ENCOUNTER — Other Ambulatory Visit: Payer: Self-pay | Admitting: *Deleted

## 2020-05-29 ENCOUNTER — Ambulatory Visit (INDEPENDENT_AMBULATORY_CARE_PROVIDER_SITE_OTHER): Payer: Medicare HMO | Admitting: *Deleted

## 2020-05-29 DIAGNOSIS — Z20822 Contact with and (suspected) exposure to covid-19: Secondary | ICD-10-CM | POA: Diagnosis not present

## 2020-05-29 NOTE — Telephone Encounter (Signed)
Patient is scheduled for COVID TEST today

## 2020-05-29 NOTE — Progress Notes (Signed)
Pt presented to the office. COVID testing done in pt's car.

## 2020-05-31 LAB — NOVEL CORONAVIRUS, NAA: SARS-CoV-2, NAA: NOT DETECTED

## 2020-06-03 ENCOUNTER — Encounter: Payer: Self-pay | Admitting: Family Medicine

## 2020-06-04 ENCOUNTER — Other Ambulatory Visit: Payer: Self-pay | Admitting: Family Medicine

## 2020-06-04 ENCOUNTER — Other Ambulatory Visit: Payer: Self-pay

## 2020-06-04 ENCOUNTER — Encounter: Payer: Self-pay | Admitting: Family Medicine

## 2020-06-04 ENCOUNTER — Encounter: Payer: Medicare HMO | Admitting: Family Medicine

## 2020-06-04 ENCOUNTER — Ambulatory Visit (INDEPENDENT_AMBULATORY_CARE_PROVIDER_SITE_OTHER): Payer: Medicare HMO | Admitting: Family Medicine

## 2020-06-04 VITALS — BP 127/81 | HR 72 | Temp 98.0°F | Ht 64.0 in | Wt 168.0 lb

## 2020-06-04 DIAGNOSIS — E785 Hyperlipidemia, unspecified: Secondary | ICD-10-CM

## 2020-06-04 DIAGNOSIS — E038 Other specified hypothyroidism: Secondary | ICD-10-CM

## 2020-06-04 DIAGNOSIS — I10 Essential (primary) hypertension: Secondary | ICD-10-CM

## 2020-06-04 DIAGNOSIS — E559 Vitamin D deficiency, unspecified: Secondary | ICD-10-CM | POA: Diagnosis not present

## 2020-06-04 DIAGNOSIS — H3530 Unspecified macular degeneration: Secondary | ICD-10-CM | POA: Insufficient documentation

## 2020-06-04 NOTE — Progress Notes (Signed)
Patient: Teresa Rangel MRN: [id number] DOB: [birth year] PCP: Orma Flaming, MD     Subjective:  Chief Complaint  Patient presents with  . Hypertension  . Hypothyroidism  . Hyperlipidemia  . Gout  . vitamin d deficiency    HPI: The patient is a 79 y.o. female who presents today for routine thyroid check, and labs.  Hypertension: Here for follow up of hypertension.  Currently on norvasc 5mg /day, hctz 25mg /day and quinapril 40mg /day. Takes medication as prescribed and denies any side effects. Exercise includes walking.  Weight has been stable. Denies any chest pain, headaches, shortness of breath, vision changes, swelling in lower extremities.   Hypothyroidism She is taking her thyroid daily. She feels like dosage is perfect. Euthyroid and takes in Am on empty stomach.   Vitamin D deficiency -recheck today  -on 2000IU/day  Dyslipidemia -currently on pravachol 20mg  nightly. Takes a prescribed.   Gout -no flairs since starting allopurinol  Review of Systems  Constitutional: Negative for chills, fatigue and fever.  HENT: Negative for dental problem, ear pain, hearing loss and trouble swallowing.   Eyes: Negative for visual disturbance.  Respiratory: Negative for cough, chest tightness and shortness of breath.   Cardiovascular: Negative for chest pain, palpitations and leg swelling.  Gastrointestinal: Negative for abdominal pain, blood in stool, diarrhea and nausea.  Endocrine: Negative for cold intolerance, polydipsia, polyphagia and polyuria.  Genitourinary: Negative for dysuria, frequency, hematuria and urgency.  Musculoskeletal: Negative for arthralgias.  Skin: Negative for rash.  Neurological: Negative for dizziness and headaches.  Psychiatric/Behavioral: Negative for dysphoric mood and sleep disturbance. The patient is not nervous/anxious.     Allergies Patient is allergic to fluzone [flu virus vaccine] and tetracyclines & related.  Past Medical  History Patient  has a past medical history of History of stomach ulcers (1980), Hyperlipidemia, Hypertension, Hypothyroidism, and Tumor of thyroid (08/23/2019).  Surgical History Patient  has a past surgical history that includes Thyroidectomy (2007); Appendectomy (1954); Tonsillectomy (1949); laparoscopy (8413); and Colonoscopy.  Family History Pateint's family history includes Breast cancer (age of onset: 11) in her maternal aunt.  Social History Patient  reports that she has never smoked. She has never used smokeless tobacco. She reports current alcohol use of about 1.0 standard drink of alcohol per week. She reports that she does not use drugs.    Objective: Vitals:   06/04/20 1115  BP: 127/81  Pulse: 72  Temp: 98 F (36.7 C)  TempSrc: Temporal  SpO2: 97%  Weight: 168 lb (76.2 kg)  Height: 5\' 4"  (1.626 m)    Body mass index is 28.84 kg/m.  Physical Exam Vitals reviewed.  Constitutional:      Appearance: Normal appearance. She is well-developed and normal weight.  HENT:     Head: Normocephalic and atraumatic.     Right Ear: Tympanic membrane, ear canal and external ear normal.     Left Ear: Tympanic membrane, ear canal and external ear normal.     Nose: Nose normal.     Mouth/Throat:     Mouth: Mucous membranes are moist.  Eyes:     Conjunctiva/sclera: Conjunctivae normal.     Pupils: Pupils are equal, round, and reactive to light.  Neck:     Thyroid: No thyromegaly.  Cardiovascular:     Rate and Rhythm: Normal rate and regular rhythm.     Heart sounds: Normal heart sounds. No murmur heard.   Pulmonary:     Effort: Pulmonary effort is normal.     Breath  sounds: Normal breath sounds.  Abdominal:     General: Bowel sounds are normal. There is no distension.     Palpations: Abdomen is soft.     Tenderness: There is no abdominal tenderness.  Musculoskeletal:     Cervical back: Normal range of motion and neck supple.  Lymphadenopathy:     Cervical: No cervical  adenopathy.  Skin:    General: Skin is warm and dry.     Capillary Refill: Capillary refill takes less than 2 seconds.     Findings: No rash.  Neurological:     General: No focal deficit present.     Mental Status: She is alert and oriented to person, place, and time.     Cranial Nerves: No cranial nerve deficit.     Coordination: Coordination normal.     Deep Tendon Reflexes: Reflexes normal.  Psychiatric:        Mood and Affect: Mood normal.        Behavior: Behavior normal.      Office Visit from 06/04/2020 in [place]  PHQ-2 Total Score 0         Assessment/plan: 1. Essential hypertension Blood pressure is to goal. Continue current anti-hypertensive medications per hpi.  Refills not given and routine lab work will be done today. Recommended routine exercise and healthy diet including DASH diet and mediterranean diet. Encouraged weight loss. F/u in 6 months.    - CBC with Differential/Platelet; Future - COMPLETE METABOLIC PANEL WITH GFR; Future - COMPLETE METABOLIC PANEL WITH GFR - CBC with Differential/Platelet  2. Other specified hypothyroidism  - T4, free; Future - TSH; Future - T4, free - TSH  3. Vitamin D deficiency  - VITAMIN D 25 Hydroxy (Vit-D Deficiency, Fractures); Future - VITAMIN D 25 Hydroxy (Vit-D Deficiency, Fractures)  4. Dyslipidemia  - Lipid panel; Future - Lipid panel    This visit occurred during the SARS-CoV-2 public health emergency.  Safety protocols were in place, including screening questions prior to the visit, additional usage of staff PPE, and extensive cleaning of exam room while observing appropriate contact time as indicated for disinfecting solutions.    Return in about 6 months (around 12/02/2020) for htn/labs .   Orma Flaming, MD [place]   06/04/2020

## 2020-06-05 ENCOUNTER — Encounter: Payer: Self-pay | Admitting: Family Medicine

## 2020-06-05 LAB — VITAMIN D 25 HYDROXY (VIT D DEFICIENCY, FRACTURES): Vit D, 25-Hydroxy: 41 ng/mL (ref 30–100)

## 2020-06-05 LAB — LIPID PANEL
Cholesterol: 258 mg/dL — ABNORMAL HIGH (ref <200)
HDL: 55 mg/dL (ref >=50)
LDL Cholesterol (Calc): 156 mg/dL (calc) — ABNORMAL HIGH
Non-HDL Cholesterol (Calc): 203 mg/dL (calc) — ABNORMAL HIGH (ref <130)
Total CHOL/HDL Ratio: 4.7 (calc) (ref <5.0)
Triglycerides: 288 mg/dL — ABNORMAL HIGH (ref <150)

## 2020-06-05 LAB — CBC WITH DIFFERENTIAL/PLATELET
Absolute Monocytes: 736 cells/uL (ref 200–950)
Basophils Absolute: 64 cells/uL (ref 0–200)
Basophils Relative: 0.8 %
Eosinophils Absolute: 264 cells/uL (ref 15–500)
Eosinophils Relative: 3.3 %
HCT: 46.8 % — ABNORMAL HIGH (ref 35.0–45.0)
Hemoglobin: 15.6 g/dL — ABNORMAL HIGH (ref 11.7–15.5)
Lymphs Abs: 2280 cells/uL (ref 850–3900)
MCH: 28.7 pg (ref 27.0–33.0)
MCHC: 33.3 g/dL (ref 32.0–36.0)
MCV: 86 fL (ref 80.0–100.0)
MPV: 9.5 fL (ref 7.5–12.5)
Monocytes Relative: 9.2 %
Neutro Abs: 4656 cells/uL (ref 1500–7800)
Neutrophils Relative %: 58.2 %
Platelets: 270 10*3/uL (ref 140–400)
RBC: 5.44 10*6/uL — ABNORMAL HIGH (ref 3.80–5.10)
RDW: 13.1 % (ref 11.0–15.0)
Total Lymphocyte: 28.5 %
WBC: 8 10*3/uL (ref 3.8–10.8)

## 2020-06-05 LAB — COMPLETE METABOLIC PANEL WITH GFR
AG Ratio: 1.6 (calc) (ref 1.0–2.5)
ALT: 21 U/L (ref 6–29)
AST: 24 U/L (ref 10–35)
Albumin: 4.4 g/dL (ref 3.6–5.1)
Alkaline phosphatase (APISO): 79 U/L (ref 37–153)
BUN: 15 mg/dL (ref 7–25)
CO2: 29 mmol/L (ref 20–32)
Calcium: 9.2 mg/dL (ref 8.6–10.4)
Chloride: 100 mmol/L (ref 98–110)
Creat: 0.74 mg/dL (ref 0.60–0.88)
GFR, Est African American: 89 mL/min/{1.73_m2} (ref >=60)
GFR, Est Non African American: 77 mL/min/{1.73_m2} (ref >=60)
Globulin: 2.8 g/dL (calc) (ref 1.9–3.7)
Glucose, Bld: 71 mg/dL (ref 65–99)
Potassium: 3.9 mmol/L (ref 3.5–5.3)
Sodium: 139 mmol/L (ref 135–146)
Total Bilirubin: 0.6 mg/dL (ref 0.2–1.2)
Total Protein: 7.2 g/dL (ref 6.1–8.1)

## 2020-06-05 LAB — T4, FREE: Free T4: 0.9 ng/dL (ref 0.8–1.8)

## 2020-06-05 LAB — TSH: TSH: 20.2 mIU/L — ABNORMAL HIGH (ref 0.40–4.50)

## 2020-06-08 ENCOUNTER — Other Ambulatory Visit: Payer: Self-pay | Admitting: Family Medicine

## 2020-06-08 DIAGNOSIS — E038 Other specified hypothyroidism: Secondary | ICD-10-CM

## 2020-06-08 MED ORDER — LEVOTHYROXINE SODIUM 100 MCG PO TABS
100.0000 ug | ORAL_TABLET | Freq: Every day | ORAL | 0 refills | Status: DC
Start: 2020-06-08 — End: 2020-11-12

## 2020-06-09 ENCOUNTER — Encounter: Payer: Self-pay | Admitting: Family Medicine

## 2020-06-11 ENCOUNTER — Encounter: Payer: Self-pay | Admitting: Family Medicine

## 2020-06-12 DIAGNOSIS — N3021 Other chronic cystitis with hematuria: Secondary | ICD-10-CM | POA: Diagnosis not present

## 2020-06-17 DIAGNOSIS — H353112 Nonexudative age-related macular degeneration, right eye, intermediate dry stage: Secondary | ICD-10-CM | POA: Diagnosis not present

## 2020-06-17 DIAGNOSIS — H35372 Puckering of macula, left eye: Secondary | ICD-10-CM | POA: Diagnosis not present

## 2020-06-17 DIAGNOSIS — H353221 Exudative age-related macular degeneration, left eye, with active choroidal neovascularization: Secondary | ICD-10-CM | POA: Diagnosis not present

## 2020-06-17 DIAGNOSIS — H43811 Vitreous degeneration, right eye: Secondary | ICD-10-CM | POA: Diagnosis not present

## 2020-06-30 DIAGNOSIS — N3021 Other chronic cystitis with hematuria: Secondary | ICD-10-CM | POA: Diagnosis not present

## 2020-07-09 ENCOUNTER — Other Ambulatory Visit: Payer: Self-pay | Admitting: Family Medicine

## 2020-07-15 DIAGNOSIS — H43811 Vitreous degeneration, right eye: Secondary | ICD-10-CM | POA: Diagnosis not present

## 2020-07-15 DIAGNOSIS — H35372 Puckering of macula, left eye: Secondary | ICD-10-CM | POA: Diagnosis not present

## 2020-07-15 DIAGNOSIS — H353112 Nonexudative age-related macular degeneration, right eye, intermediate dry stage: Secondary | ICD-10-CM | POA: Diagnosis not present

## 2020-07-15 DIAGNOSIS — H353221 Exudative age-related macular degeneration, left eye, with active choroidal neovascularization: Secondary | ICD-10-CM | POA: Diagnosis not present

## 2020-07-21 ENCOUNTER — Other Ambulatory Visit: Payer: Medicare HMO

## 2020-07-21 DIAGNOSIS — E038 Other specified hypothyroidism: Secondary | ICD-10-CM | POA: Diagnosis not present

## 2020-07-22 LAB — TSH: TSH: 0.33 mIU/L — ABNORMAL LOW (ref 0.40–4.50)

## 2020-07-22 LAB — T4, FREE: Free T4: 1.6 ng/dL (ref 0.8–1.8)

## 2020-07-23 ENCOUNTER — Encounter: Payer: Self-pay | Admitting: Family Medicine

## 2020-08-04 ENCOUNTER — Other Ambulatory Visit: Payer: Self-pay | Admitting: Family Medicine

## 2020-08-12 DIAGNOSIS — H353221 Exudative age-related macular degeneration, left eye, with active choroidal neovascularization: Secondary | ICD-10-CM | POA: Diagnosis not present

## 2020-08-12 DIAGNOSIS — H353112 Nonexudative age-related macular degeneration, right eye, intermediate dry stage: Secondary | ICD-10-CM | POA: Diagnosis not present

## 2020-08-29 ENCOUNTER — Encounter: Payer: Self-pay | Admitting: Family Medicine

## 2020-08-29 DIAGNOSIS — M25561 Pain in right knee: Secondary | ICD-10-CM

## 2020-08-29 DIAGNOSIS — M25562 Pain in left knee: Secondary | ICD-10-CM

## 2020-09-01 ENCOUNTER — Encounter: Payer: Self-pay | Admitting: Family Medicine

## 2020-09-08 ENCOUNTER — Encounter: Payer: Self-pay | Admitting: Family Medicine

## 2020-09-09 DIAGNOSIS — H43811 Vitreous degeneration, right eye: Secondary | ICD-10-CM | POA: Diagnosis not present

## 2020-09-09 DIAGNOSIS — H35372 Puckering of macula, left eye: Secondary | ICD-10-CM | POA: Diagnosis not present

## 2020-09-09 DIAGNOSIS — H353112 Nonexudative age-related macular degeneration, right eye, intermediate dry stage: Secondary | ICD-10-CM | POA: Diagnosis not present

## 2020-09-09 DIAGNOSIS — H353221 Exudative age-related macular degeneration, left eye, with active choroidal neovascularization: Secondary | ICD-10-CM | POA: Diagnosis not present

## 2020-09-18 DIAGNOSIS — M25562 Pain in left knee: Secondary | ICD-10-CM | POA: Diagnosis not present

## 2020-09-18 DIAGNOSIS — M1712 Unilateral primary osteoarthritis, left knee: Secondary | ICD-10-CM | POA: Diagnosis not present

## 2020-09-19 ENCOUNTER — Other Ambulatory Visit: Payer: Self-pay

## 2020-09-19 ENCOUNTER — Ambulatory Visit (INDEPENDENT_AMBULATORY_CARE_PROVIDER_SITE_OTHER): Payer: Medicare HMO | Admitting: Family Medicine

## 2020-09-19 ENCOUNTER — Encounter: Payer: Self-pay | Admitting: Family Medicine

## 2020-09-19 VITALS — BP 136/78 | HR 68 | Temp 98.1°F | Ht 64.0 in | Wt 165.8 lb

## 2020-09-19 DIAGNOSIS — E038 Other specified hypothyroidism: Secondary | ICD-10-CM | POA: Diagnosis not present

## 2020-09-19 DIAGNOSIS — E785 Hyperlipidemia, unspecified: Secondary | ICD-10-CM

## 2020-09-19 DIAGNOSIS — F419 Anxiety disorder, unspecified: Secondary | ICD-10-CM

## 2020-09-19 DIAGNOSIS — F32A Anxiety disorder, unspecified: Secondary | ICD-10-CM

## 2020-09-19 DIAGNOSIS — R69 Illness, unspecified: Secondary | ICD-10-CM | POA: Diagnosis not present

## 2020-09-19 LAB — LIPID PANEL
Cholesterol: 237 mg/dL — ABNORMAL HIGH (ref 0–200)
HDL: 65.5 mg/dL (ref >39.00)
LDL Cholesterol: 153 mg/dL — ABNORMAL HIGH (ref 0–99)
NonHDL: 171.55
Total CHOL/HDL Ratio: 4
Triglycerides: 93 mg/dL (ref 0.0–149.0)
VLDL: 18.6 mg/dL (ref 0.0–40.0)

## 2020-09-19 LAB — TSH: TSH: 7.4 u[IU]/mL — ABNORMAL HIGH (ref 0.35–4.50)

## 2020-09-19 LAB — T4, FREE: Free T4: 0.47 ng/dL — ABNORMAL LOW (ref 0.60–1.60)

## 2020-09-19 MED ORDER — FLUOXETINE HCL 20 MG PO TABS: 20.0000 mg | ORAL_TABLET | Freq: Every day | ORAL | 1 refills | Status: DC

## 2020-09-19 NOTE — Progress Notes (Unsigned)
Patient: Teresa Rangel MRN: [id number] DOB: 05-Feb-1941 PCP: Orma Flaming, MD     Subjective:  Chief Complaint  Patient presents with  . Hypothyroidism  . Depression  . Hyperlipidemia    HPI: The patient is a 80 y.o. female who presents today for Depression and Thyroid labs.  Hypothyroidism Have a hard time getting her thyroid to goal. States she is taking on an empty stomach in the AM. Over due for labs. She is currently on 156mcg 3x/week and 53mcg 4x/week.   Depression/anxiety She states it's a mix of a lot of things. She was on lexapro in 2018-2019 in the past and feels like she needs to be back on something. She was put on this for possibly for both anxiety and depression. She was on prozac through menopause. She states between covid, being 80 years of age and feeling like she is breaking down. She also knew she was anxious, but didn't think she was as bad as the paper shows her to be. She feels short tempered, can never relax, feels like something is going to happen. Her husband feels like she needs to be on something because he thinks she is very irritable. Her best friends husband just died and she has been trying to deal with this as well. She has excellent support with her husband and family. Denies any si/hi.   Hyperlipidemia Her cholesterol went up quite a bit. She tells me she wasn't taking her medication like she should. She has started this back.   Review of Systems  Constitutional: Negative for chills, fatigue and fever.  HENT: Negative for dental problem, ear pain, hearing loss and trouble swallowing.   Eyes: Negative for visual disturbance.  Respiratory: Negative for cough, chest tightness and shortness of breath.   Cardiovascular: Negative for chest pain, palpitations and leg swelling.  Gastrointestinal: Negative for abdominal pain, blood in stool, diarrhea and nausea.  Endocrine: Negative for cold intolerance, polydipsia, polyphagia and polyuria.   Genitourinary: Negative for dysuria and hematuria.  Musculoskeletal: Negative for arthralgias.  Skin: Negative for rash.  Neurological: Negative for dizziness and headaches.  Psychiatric/Behavioral: Positive for dysphoric mood and sleep disturbance. The patient is not nervous/anxious.     Allergies Patient is allergic to fluzone [influenza virus vaccine] and tetracyclines & related.  Past Medical History Patient  has a past medical history of History of stomach ulcers (1980), Hyperlipidemia, Hypertension, Hypothyroidism, and Tumor of thyroid (08/23/2019).  Surgical History Patient  has a past surgical history that includes Thyroidectomy (2007); Appendectomy (1954); Tonsillectomy (1949); laparoscopy (7829); and Colonoscopy.  Family History Pateint's family history includes Breast cancer (age of onset: 34) in her maternal aunt.  Social History Patient  reports that she has never smoked. She has never used smokeless tobacco. She reports current alcohol use of about 1.0 standard drink of alcohol per week. She reports that she does not use drugs.    Objective: Vitals:   09/19/20 1032  BP: 136/78  Pulse: 68  Temp: 98.1 F (36.7 C)  TempSrc: Temporal  SpO2: 97%  Weight: 165 lb 12.8 oz (75.2 kg)  Height: 5\' 4"  (1.626 m)    Body mass index is 28.46 kg/m.  Physical Exam Vitals reviewed.  Constitutional:      Appearance: Normal appearance. She is well-developed, normal weight and well-nourished.  HENT:     Head: Normocephalic and atraumatic.     Right Ear: External ear normal.     Left Ear: External ear normal.     Mouth/Throat:  Mouth: Oropharynx is clear and moist.  Eyes:     Extraocular Movements: EOM normal.     Conjunctiva/sclera: Conjunctivae normal.     Pupils: Pupils are equal, round, and reactive to light.  Neck:     Thyroid: No thyromegaly.     Comments: No thyromegaly  Cardiovascular:     Rate and Rhythm: Normal rate and regular rhythm.     Pulses: Normal  pulses and intact distal pulses.     Heart sounds: Normal heart sounds. No murmur heard.   Pulmonary:     Effort: Pulmonary effort is normal.     Breath sounds: Normal breath sounds.  Abdominal:     General: Abdomen is flat. Bowel sounds are normal. There is no distension.     Palpations: Abdomen is soft.     Tenderness: There is no abdominal tenderness.  Musculoskeletal:     Cervical back: Normal range of motion and neck supple.  Lymphadenopathy:     Cervical: No cervical adenopathy.  Skin:    General: Skin is warm and dry.     Capillary Refill: Capillary refill takes less than 2 seconds.     Findings: No rash.  Neurological:     General: No focal deficit present.     Mental Status: She is alert and oriented to person, place, and time.     Cranial Nerves: No cranial nerve deficit.     Coordination: Coordination normal.     Deep Tendon Reflexes: Reflexes normal.  Psychiatric:        Mood and Affect: Mood and affect and mood normal.        Behavior: Behavior normal.     Comments: No si/hi/ah/vh       [place] Office Visit from 09/19/2020 in [place]  PHQ-9 Total Score 16     GAD 7 : Generalized Anxiety Score 09/19/2020  Nervous, Anxious, on Edge 2  Control/stop worrying 2  Worry too much - different things 2  Trouble relaxing 3  Restless 3  Easily annoyed or irritable 2  Afraid - awful might happen 2  Total GAD 7 Score 16  Anxiety Difficulty Very difficult     Assessment/plan: 1. Other specified hypothyroidism Taking medication as prescribed on empty stomach. Having a hard time getting wnl. Due today after dosage change take 42mcg 4x/week and 129mcg 3x/week. Will adjust as needed. Also had some issues with medication after further discussing and was not taking as prescribed for awhile or on wrong dose.  - T4, free - TSH  2. Dyslipidemia Was not taking her medication on last lab draw. Will do again today, has been  - Lipid  panel  3. Anxiety and depression phq 9 score is moderate-severe. Her GAD7 score is also moderately severe. Discussed options of therapy, exercise and medication. Due to how it's affecting her daily life and relationships we are going to start medications. Will start her on low dose prozac. Discussed will take 3-4 weeks to get into her system. I've explained to her that drugs of the SSRI class can have side effects such as weight gain, sexual dysfunction, insomnia, headache, nausea. These medications are generally effective at alleviating symptoms of anxiety and/or depression. Let me know if significant side effects do occur. If any SI/HI she is to call 911 or go to ER. Will have close f/u in 1 month for recheck. Encouraged exercise and counseling as well.   This visit occurred during the SARS-CoV-2 public health emergency.  Safety protocols were in place, including screening questions prior to the visit, additional usage of staff PPE, and extensive cleaning of exam room while observing appropriate contact time as indicated for disinfecting solutions.       Return in about 1 month (around 10/20/2020) for anxiety and depression. telehealth is fine .   Orma Flaming, MD [place]   09/20/2020

## 2020-09-19 NOTE — Patient Instructions (Signed)
prozac 20mg /day. We will start this and it will treat both anxiety and depression. Let me know if any issues. Can take 3 weeks to get into your system so usually by a month we can see how you are doing...   Repeating thyroid and cholesterol labs.    Vitamins you can take for prophylaxis for covid.  1)vitamin D3: 2000IU/day 2) vitamin C: 500mg /day 3) zinc: 20mg /day 4) quercetin: 250mg /day 5) melatonin 6mg /night  Get iodine drops to have if you get covid (1% or 2%) can get off [employer] and a pulse ox.    i'll see you back in 1 month for recheck for anxiety and depression.   Hugs!

## 2020-09-22 ENCOUNTER — Encounter: Payer: Self-pay | Admitting: Family Medicine

## 2020-09-22 DIAGNOSIS — L700 Acne vulgaris: Secondary | ICD-10-CM | POA: Diagnosis not present

## 2020-09-22 DIAGNOSIS — L821 Other seborrheic keratosis: Secondary | ICD-10-CM | POA: Diagnosis not present

## 2020-09-22 DIAGNOSIS — L720 Epidermal cyst: Secondary | ICD-10-CM | POA: Diagnosis not present

## 2020-09-22 DIAGNOSIS — L570 Actinic keratosis: Secondary | ICD-10-CM | POA: Diagnosis not present

## 2020-09-22 DIAGNOSIS — D1801 Hemangioma of skin and subcutaneous tissue: Secondary | ICD-10-CM | POA: Diagnosis not present

## 2020-09-24 ENCOUNTER — Other Ambulatory Visit: Payer: Self-pay | Admitting: Family Medicine

## 2020-09-24 DIAGNOSIS — E038 Other specified hypothyroidism: Secondary | ICD-10-CM

## 2020-10-16 ENCOUNTER — Other Ambulatory Visit: Payer: Self-pay | Admitting: Family Medicine

## 2020-10-20 DIAGNOSIS — H353112 Nonexudative age-related macular degeneration, right eye, intermediate dry stage: Secondary | ICD-10-CM | POA: Diagnosis not present

## 2020-10-20 DIAGNOSIS — H353221 Exudative age-related macular degeneration, left eye, with active choroidal neovascularization: Secondary | ICD-10-CM | POA: Diagnosis not present

## 2020-10-30 ENCOUNTER — Other Ambulatory Visit: Payer: Self-pay | Admitting: Family Medicine

## 2020-10-30 ENCOUNTER — Other Ambulatory Visit: Payer: Self-pay

## 2020-11-07 ENCOUNTER — Encounter: Payer: Self-pay | Admitting: Family Medicine

## 2020-11-07 ENCOUNTER — Other Ambulatory Visit: Payer: Self-pay

## 2020-11-07 ENCOUNTER — Other Ambulatory Visit (INDEPENDENT_AMBULATORY_CARE_PROVIDER_SITE_OTHER): Payer: Medicare HMO

## 2020-11-07 DIAGNOSIS — E038 Other specified hypothyroidism: Secondary | ICD-10-CM | POA: Diagnosis not present

## 2020-11-07 LAB — TSH: TSH: 2.73 u[IU]/mL (ref 0.35–4.50)

## 2020-11-07 LAB — T4, FREE: Free T4: 1.33 ng/dL (ref 0.60–1.60)

## 2020-11-12 ENCOUNTER — Other Ambulatory Visit: Payer: Self-pay

## 2020-11-12 MED ORDER — LEVOTHYROXINE SODIUM 88 MCG PO TABS: ORAL_TABLET | ORAL | 3 refills | Status: DC

## 2020-11-12 MED ORDER — LEVOTHYROXINE SODIUM 100 MCG PO TABS: 100.0000 ug | ORAL_TABLET | Freq: Every day | ORAL | 0 refills | Status: DC

## 2020-11-12 NOTE — Telephone Encounter (Signed)
I have sent in refill for 100 mcg, I did not see the 88 mcg in her list. Is it okay to send?

## 2020-11-21 DIAGNOSIS — M25561 Pain in right knee: Secondary | ICD-10-CM | POA: Diagnosis not present

## 2020-11-21 DIAGNOSIS — M25562 Pain in left knee: Secondary | ICD-10-CM | POA: Diagnosis not present

## 2020-12-15 DIAGNOSIS — H353221 Exudative age-related macular degeneration, left eye, with active choroidal neovascularization: Secondary | ICD-10-CM | POA: Diagnosis not present

## 2020-12-15 DIAGNOSIS — H353112 Nonexudative age-related macular degeneration, right eye, intermediate dry stage: Secondary | ICD-10-CM | POA: Diagnosis not present

## 2021-01-18 ENCOUNTER — Other Ambulatory Visit: Payer: Self-pay | Admitting: Family Medicine

## 2021-01-24 ENCOUNTER — Other Ambulatory Visit: Payer: Self-pay | Admitting: Family Medicine

## 2021-01-26 DIAGNOSIS — H353112 Nonexudative age-related macular degeneration, right eye, intermediate dry stage: Secondary | ICD-10-CM | POA: Diagnosis not present

## 2021-01-26 DIAGNOSIS — H353221 Exudative age-related macular degeneration, left eye, with active choroidal neovascularization: Secondary | ICD-10-CM | POA: Diagnosis not present

## 2021-01-26 DIAGNOSIS — H35372 Puckering of macula, left eye: Secondary | ICD-10-CM | POA: Diagnosis not present

## 2021-01-26 DIAGNOSIS — H43811 Vitreous degeneration, right eye: Secondary | ICD-10-CM | POA: Diagnosis not present

## 2021-02-04 DIAGNOSIS — R8271 Bacteriuria: Secondary | ICD-10-CM | POA: Diagnosis not present

## 2021-02-04 DIAGNOSIS — N3021 Other chronic cystitis with hematuria: Secondary | ICD-10-CM | POA: Diagnosis not present

## 2021-02-06 ENCOUNTER — Encounter: Payer: Self-pay | Admitting: Family Medicine

## 2021-02-06 ENCOUNTER — Ambulatory Visit (INDEPENDENT_AMBULATORY_CARE_PROVIDER_SITE_OTHER): Payer: Medicare HMO | Admitting: Family Medicine

## 2021-02-06 ENCOUNTER — Other Ambulatory Visit: Payer: Self-pay

## 2021-02-06 VITALS — BP 152/76 | HR 77 | Temp 97.6°F | Ht 64.0 in | Wt 161.0 lb

## 2021-02-06 DIAGNOSIS — F5101 Primary insomnia: Secondary | ICD-10-CM | POA: Diagnosis not present

## 2021-02-06 DIAGNOSIS — R69 Illness, unspecified: Secondary | ICD-10-CM | POA: Diagnosis not present

## 2021-02-06 MED ORDER — TRAZODONE HCL 50 MG PO TABS: 25.0000 mg | ORAL_TABLET | Freq: Every evening | ORAL | 3 refills | Status: DC | PRN

## 2021-02-06 MED ORDER — LEVOTHYROXINE SODIUM 100 MCG PO TABS: 100.0000 ug | ORAL_TABLET | Freq: Every day | ORAL | 1 refills | Status: DC

## 2021-02-06 NOTE — Progress Notes (Signed)
Patient: Teresa Rangel MRN: [id number] DOB: 10/15/1940 PCP: Orma Flaming, MD     Subjective:  Chief Complaint  Patient presents with  . Anxiety  . Depression  . Insomnia    HPI: The patient is a 80 y.o. female who presents today for Anxiety/Depression follow up. She says that she is feeling a lot better. She states that her Thyroid was out of wack that may have been why she felt the way she did. She has weaned down nearly off the prozac. Also has complaints of insomnia.     Insomnia She has always had issues with sleep. She states it has become much worse. She was on ambien in the past and took this for a long time. She took melatonin for a while and this worked, but then she started to have dreams and it became ineffective. Follows good sleep hygiene. Has not tried ashwagandha.   Anxiety I started her on prozac, but her thyroid has all been out of whack and we have her finally within range and she has been feeling better. She wonders if this was the cause of her anxiety. She has weaned down to prozac 3 days a week and feels like her anxiety is fine. She is wanting to go off of it.    Review of Systems  Constitutional: Negative for chills, fatigue and fever.  HENT: Negative for dental problem, ear pain, hearing loss and trouble swallowing.   Eyes: Negative for visual disturbance.  Respiratory: Negative for cough, chest tightness and shortness of breath.   Cardiovascular: Negative for chest pain, palpitations and leg swelling.  Gastrointestinal: Negative for abdominal pain, blood in stool, diarrhea and nausea.  Endocrine: Negative for cold intolerance, polydipsia, polyphagia and polyuria.  Genitourinary: Negative for dysuria and hematuria.  Musculoskeletal: Negative for arthralgias.  Skin: Negative for rash.  Neurological: Negative for dizziness and headaches.  Psychiatric/Behavioral: Negative for dysphoric mood and sleep disturbance. The patient is not nervous/anxious.      Allergies Patient is allergic to fluzone [influenza virus vaccine] and tetracyclines & related.  Past Medical History Patient  has a past medical history of History of stomach ulcers (1980), Hyperlipidemia, Hypertension, Hypothyroidism, and Tumor of thyroid (08/23/2019).  Surgical History Patient  has a past surgical history that includes Thyroidectomy (2007); Appendectomy (1954); Tonsillectomy (1949); laparoscopy (6812); and Colonoscopy.  Family History Pateint's family history includes Breast cancer (age of onset: 37) in her maternal aunt.  Social History Patient  reports that she has never smoked. She has never used smokeless tobacco. She reports current alcohol use of about 1.0 standard drink of alcohol per week. She reports that she does not use drugs.    Objective: Vitals:   02/06/21 1257  BP: (!) 152/76  Pulse: 77  Temp: 97.6 F (36.4 C)  TempSrc: Temporal  SpO2: 97%  Weight: 161 lb (73 kg)  Height: 5\' 4"  (1.626 m)    Body mass index is 27.64 kg/m.  Physical Exam Vitals reviewed.  Constitutional:      Appearance: Normal appearance. She is normal weight.  HENT:     Head: Normocephalic and atraumatic.  Pulmonary:     Effort: Pulmonary effort is normal.  Neurological:     General: No focal deficit present.     Mental Status: She is alert and oriented to person, place, and time.  Psychiatric:        Mood and Affect: Mood normal.        Behavior: Behavior normal.  GAD 7 : Generalized Anxiety Score 02/06/2021 09/19/2020  Nervous, Anxious, on Edge 1 2  Control/stop worrying 0 2  Worry too much - different things 1 2  Trouble relaxing 1 3  Restless 1 3  Easily annoyed or irritable 0 2  Afraid - awful might happen 1 2  Total GAD 7 Score 5 16  Anxiety Difficulty Somewhat difficult Very difficult        Assessment/plan: 1. Primary insomnia Already doing sleep hygine -trial valerian root and ashwagandha. If these do not work then I have sent in  trazodone to trial as needed. F/u in 2 months.   Anxiety -thyroid vs. Situational. Doing much better and GAD7 score significantly improved. She can stop prozac all together and see how she does. May find she still needs this, but may also be helpful that her thyroid is now euthyroid. F/u in 3 months.    Return in about 2 months (around 04/08/2021) for htn/thyroid f/u and labs wiht dr. Jerline Pain (he sees her husband) .   Orma Flaming, MD [place]   02/06/2021

## 2021-02-06 NOTE — Patient Instructions (Addendum)
[phone] ( my cell phone)     Natural things for sleep 1) ashwagandha before bed. Helps with stress/sleep.  2) valerian root for sleep  3) no caffeine after 2pm.  4) exercise 5) lavender oil also helps 6) trazodone also sent in and you can do this as needed. Prescription drug that will help sleep. 1/2-1 tab as needed.    Refilled thyroid medication.    Can stop prozac now.

## 2021-02-16 ENCOUNTER — Other Ambulatory Visit: Payer: Self-pay | Admitting: Family Medicine

## 2021-02-16 DIAGNOSIS — Z1231 Encounter for screening mammogram for malignant neoplasm of breast: Secondary | ICD-10-CM

## 2021-02-17 DIAGNOSIS — M25561 Pain in right knee: Secondary | ICD-10-CM | POA: Diagnosis not present

## 2021-02-17 DIAGNOSIS — M25562 Pain in left knee: Secondary | ICD-10-CM | POA: Diagnosis not present

## 2021-02-17 DIAGNOSIS — M1712 Unilateral primary osteoarthritis, left knee: Secondary | ICD-10-CM | POA: Diagnosis not present

## 2021-03-09 DIAGNOSIS — H43811 Vitreous degeneration, right eye: Secondary | ICD-10-CM | POA: Diagnosis not present

## 2021-03-09 DIAGNOSIS — H35372 Puckering of macula, left eye: Secondary | ICD-10-CM | POA: Diagnosis not present

## 2021-03-09 DIAGNOSIS — H353221 Exudative age-related macular degeneration, left eye, with active choroidal neovascularization: Secondary | ICD-10-CM | POA: Diagnosis not present

## 2021-03-09 DIAGNOSIS — H353112 Nonexudative age-related macular degeneration, right eye, intermediate dry stage: Secondary | ICD-10-CM | POA: Diagnosis not present

## 2021-03-14 ENCOUNTER — Other Ambulatory Visit: Payer: Self-pay | Admitting: Family Medicine

## 2021-04-08 ENCOUNTER — Other Ambulatory Visit: Payer: Medicare HMO

## 2021-04-08 ENCOUNTER — Other Ambulatory Visit: Payer: Self-pay

## 2021-04-10 ENCOUNTER — Other Ambulatory Visit: Payer: Self-pay

## 2021-04-10 ENCOUNTER — Ambulatory Visit
Admission: RE | Admit: 2021-04-10 | Discharge: 2021-04-10 | Disposition: A | Discharge status: Home / Self Care | Payer: Medicare HMO | Source: Ambulatory Visit | Attending: Family Medicine | Admitting: Family Medicine

## 2021-04-10 DIAGNOSIS — Z1231 Encounter for screening mammogram for malignant neoplasm of breast: Secondary | ICD-10-CM

## 2021-04-13 DIAGNOSIS — N3021 Other chronic cystitis with hematuria: Secondary | ICD-10-CM | POA: Diagnosis not present

## 2021-04-20 DIAGNOSIS — H35372 Puckering of macula, left eye: Secondary | ICD-10-CM | POA: Diagnosis not present

## 2021-04-20 DIAGNOSIS — H43813 Vitreous degeneration, bilateral: Secondary | ICD-10-CM | POA: Diagnosis not present

## 2021-04-20 DIAGNOSIS — H353221 Exudative age-related macular degeneration, left eye, with active choroidal neovascularization: Secondary | ICD-10-CM | POA: Diagnosis not present

## 2021-04-20 DIAGNOSIS — H353112 Nonexudative age-related macular degeneration, right eye, intermediate dry stage: Secondary | ICD-10-CM | POA: Diagnosis not present

## 2021-04-30 DIAGNOSIS — M1712 Unilateral primary osteoarthritis, left knee: Secondary | ICD-10-CM | POA: Diagnosis not present

## 2021-05-07 DIAGNOSIS — M1712 Unilateral primary osteoarthritis, left knee: Secondary | ICD-10-CM | POA: Diagnosis not present

## 2021-05-08 ENCOUNTER — Ambulatory Visit: Payer: Medicare HMO

## 2021-05-14 ENCOUNTER — Encounter: Payer: Medicare HMO | Admitting: Physician Assistant

## 2021-05-14 DIAGNOSIS — M1712 Unilateral primary osteoarthritis, left knee: Secondary | ICD-10-CM | POA: Diagnosis not present

## 2021-05-15 ENCOUNTER — Other Ambulatory Visit: Payer: Self-pay

## 2021-05-15 ENCOUNTER — Ambulatory Visit (INDEPENDENT_AMBULATORY_CARE_PROVIDER_SITE_OTHER): Payer: Medicare HMO | Admitting: Cardiology

## 2021-05-15 DIAGNOSIS — Z5329 Procedure and treatment not carried out because of patient's decision for other reasons: Secondary | ICD-10-CM

## 2021-05-26 ENCOUNTER — Other Ambulatory Visit: Payer: Self-pay

## 2021-05-26 ENCOUNTER — Encounter: Payer: Self-pay | Admitting: Physician Assistant

## 2021-05-26 ENCOUNTER — Ambulatory Visit (INDEPENDENT_AMBULATORY_CARE_PROVIDER_SITE_OTHER): Payer: Medicare HMO | Admitting: Physician Assistant

## 2021-05-26 VITALS — BP 120/73 | HR 66 | Temp 97.5°F | Ht 64.0 in | Wt 159.4 lb

## 2021-05-26 DIAGNOSIS — F5101 Primary insomnia: Secondary | ICD-10-CM

## 2021-05-26 DIAGNOSIS — E038 Other specified hypothyroidism: Secondary | ICD-10-CM

## 2021-05-26 DIAGNOSIS — E785 Hyperlipidemia, unspecified: Secondary | ICD-10-CM

## 2021-05-26 DIAGNOSIS — R69 Illness, unspecified: Secondary | ICD-10-CM | POA: Diagnosis not present

## 2021-05-26 DIAGNOSIS — I10 Essential (primary) hypertension: Secondary | ICD-10-CM

## 2021-05-26 LAB — CBC WITH DIFFERENTIAL/PLATELET
Basophils Absolute: 0 10*3/uL (ref 0.0–0.1)
Basophils Relative: 0.9 % (ref 0.0–3.0)
Eosinophils Absolute: 0.2 10*3/uL (ref 0.0–0.7)
Eosinophils Relative: 4 % (ref 0.0–5.0)
HCT: 42.3 % (ref 36.0–46.0)
Hemoglobin: 14.5 g/dL (ref 12.0–15.0)
Lymphocytes Relative: 27.4 % (ref 12.0–46.0)
Lymphs Abs: 1.6 10*3/uL (ref 0.7–4.0)
MCHC: 34.2 g/dL (ref 30.0–36.0)
MCV: 82.7 fl (ref 78.0–100.0)
Monocytes Absolute: 0.5 10*3/uL (ref 0.1–1.0)
Monocytes Relative: 9.1 % (ref 3.0–12.0)
Neutro Abs: 3.3 10*3/uL (ref 1.4–7.7)
Neutrophils Relative %: 58.6 % (ref 43.0–77.0)
Platelets: 221 10*3/uL (ref 150.0–400.0)
RBC: 5.12 Mil/uL — ABNORMAL HIGH (ref 3.87–5.11)
RDW: 13 % (ref 11.5–15.5)
WBC: 5.7 10*3/uL (ref 4.0–10.5)

## 2021-05-26 LAB — LIPID PANEL
Cholesterol: 183 mg/dL (ref 0–200)
HDL: 55.7 mg/dL (ref >39.00)
LDL Cholesterol: 103 mg/dL — ABNORMAL HIGH (ref 0–99)
NonHDL: 127.06
Total CHOL/HDL Ratio: 3
Triglycerides: 119 mg/dL (ref 0.0–149.0)
VLDL: 23.8 mg/dL (ref 0.0–40.0)

## 2021-05-26 LAB — COMPREHENSIVE METABOLIC PANEL
ALT: 16 U/L (ref 0–35)
AST: 20 U/L (ref 0–37)
Albumin: 4 g/dL (ref 3.5–5.2)
Alkaline Phosphatase: 60 U/L (ref 39–117)
BUN: 17 mg/dL (ref 6–23)
CO2: 32 mEq/L (ref 19–32)
Calcium: 9.3 mg/dL (ref 8.4–10.5)
Chloride: 100 mEq/L (ref 96–112)
Creatinine, Ser: 0.69 mg/dL (ref 0.40–1.20)
GFR: 81.41 mL/min (ref >60.00)
Glucose, Bld: 85 mg/dL (ref 70–99)
Potassium: 3.1 mEq/L — ABNORMAL LOW (ref 3.5–5.1)
Sodium: 140 mEq/L (ref 135–145)
Total Bilirubin: 0.7 mg/dL (ref 0.2–1.2)
Total Protein: 6.7 g/dL (ref 6.0–8.3)

## 2021-05-26 LAB — T4, FREE: Free T4: 1.26 ng/dL (ref 0.60–1.60)

## 2021-05-26 LAB — TSH: TSH: 0.19 u[IU]/mL — ABNORMAL LOW (ref 0.35–5.50)

## 2021-05-26 NOTE — Progress Notes (Signed)
Established Patient Office Visit  Subjective:  Patient ID: Teresa Rangel, female    DOB: 1941/05/21  Age: 80 y.o. MRN: [id number]  CC:  Chief Complaint  Patient presents with   Hypertension   Thyroid Problem   Medication Refill    Albuterol    HPI Teresa Rangel presents for [facility] visit from Dr. Rogers Blocker and lab check today. She has been taking her medications as directed and denies any concerns or issues today.  Past Medical History:  Diagnosis Date   History of stomach ulcers 1980   Hyperlipidemia    Hypertension    Hypothyroidism    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   Benign     Past Surgical History:  Procedure Laterality Date   [place]   THYROIDECTOMY  2007   TONSILLECTOMY  1949    Family History  Problem Relation Age of Onset   Breast cancer Maternal Aunt 61    Social History   Socioeconomic History   Marital status: Married    Spouse name: Not on file   Number of children: Not on file   Years of education: Not on file   Highest education level: Not on file  Occupational History   Not on file  Tobacco Use   Smoking status: Never   Smokeless tobacco: Never  Substance and Sexual Activity   Alcohol use: Yes    Alcohol/week: 1.0 standard drink    Types: 1 Glasses of wine per week    Comment: occassional   Drug use: No   Sexual activity: Yes    Partners: Male  Other Topics Concern   Not on file  Social History Narrative   Part time Pharmacist, 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 18 g 1   allopurinol (ZYLOPRIM) 100 MG tablet TAKE ONE TABLET BY MOUTH DAILY--DO NOT TAKE DURING GOUT FLARE 90 tablet 3   amLODipine (NORVASC) 5 MG tablet TAKE ONE TABLET BY MOUTH DAILY 90 tablet 3   cholecalciferol (VITAMIN D) 1000 UNITS tablet Take 2,000 Units by mouth daily.     conjugated estrogens (PREMARIN) vaginal cream Place 1 Applicatorful vaginally every 3 (three) days. 42.5 g 12   hydrochlorothiazide (HYDRODIURIL) 25 MG tablet TAKE ONE TABLET BY MOUTH DAILY 90 tablet 3   levothyroxine (SYNTHROID) 100 MCG tablet Take 1 tablet (100 mcg total) by mouth daily. 90 tablet 1   levothyroxine (SYNTHROID) 88 MCG tablet Take one tablet on Friday, Saturday and Sunday. Other days are 159mg. 40 tablet 3   pravastatin (PRAVACHOL) 20 MG tablet Take 1 tablet (20 mg total) by mouth daily. 90 tablet 4   quinapril (ACCUPRIL) 40 MG tablet TAKE ONE TABLET BY MOUTH EVERY NIGHT AT BEDTIME 90 tablet 3   traZODone (DESYREL) 50 MG tablet Take 0.5-1 tablets (25-50 mg total) by mouth at bedtime as needed for sleep. 30 tablet 3   vitamin C (ASCORBIC ACID) 500 MG  tablet Take 500 mg by mouth daily.     clindamycin (CLEOCIN T) 1 % lotion Apply topically.     cephALEXin (KEFLEX) 500 MG capsule Take 500 mg by mouth 2 (two) times daily.     colchicine 0.6 MG tablet Take 0.6 mg by mouth daily. (Patient not taking: No sig reported)     No facility-administered medications prior to visit.    Allergies  Allergen Reactions   Fluzone [Influenza Virus Vaccine] Other (See Comments)    Lethargic, muscle aches, flat on back for 3 days   Tetracyclines & Related     Stomach cramps    ROS Review of Systems REFER TO HPI FOR PERTINENT POSITIVES AND NEGATIVES    Objective:    Physical Exam Vitals and nursing note reviewed.  Constitutional:      General: She is not in acute distress.    Appearance: Normal appearance. She is normal weight.  HENT:     Head:  Normocephalic.     Right Ear: External ear normal.     Left Ear: External ear normal.     Nose: Nose normal.     Mouth/Throat:     Mouth: Mucous membranes are moist.  Eyes:     Extraocular Movements: Extraocular movements intact.     Conjunctiva/sclera: Conjunctivae normal.     Pupils: Pupils are equal, round, and reactive to light.  Neck:     Thyroid: No thyroid mass, thyromegaly or thyroid tenderness.  Cardiovascular:     Rate and Rhythm: Normal rate and regular rhythm.     Pulses: Normal pulses.     Heart sounds: No murmur heard. Pulmonary:     Effort: Pulmonary effort is normal.     Breath sounds: Normal breath sounds.  Abdominal:     Tenderness: There is no abdominal tenderness.  Musculoskeletal:        General: Normal range of motion.     Cervical back: Normal range of motion.  Skin:    General: Skin is warm.  Neurological:     General: No focal deficit present.     Mental Status: She is alert and oriented to person, place, and time.     Gait: Gait normal.  Psychiatric:        Mood and Affect: Mood normal.        Behavior: Behavior normal.    BP 120/73   Pulse 66   Temp (!) 97.5 F (36.4 C)   Ht '5\' 4"'$  (1.626 m)   Wt 159 lb 6.1 oz (72.3 kg)   LMP  (LMP Unknown)   SpO2 97%   BMI 27.36 kg/m  Wt Readings from Last 3 Encounters:  05/26/21 159 lb 6.1 oz (72.3 kg)  02/06/21 161 lb (73 kg)  09/19/20 165 lb 12.8 oz (75.2 kg)     Health Maintenance Due  Topic Date Due   Zoster Vaccines- Shingrix (1 of 2) Never done   INFLUENZA VACCINE  04/13/2021    There are no preventive care reminders to display for this patient.  Lab Results  Component Value Date   TSH 2.73 11/07/2020   Lab Results  Component Value Date   WBC 8.0 06/04/2020   HGB 15.6 (H) 06/04/2020   HCT 46.8 (H) 06/04/2020   MCV 86.0 06/04/2020   PLT 270 06/04/2020   Lab Results  Component Value Date   NA 139 06/04/2020   K 3.9 06/04/2020   CO2 29 06/04/2020   GLUCOSE 71 06/04/2020   BUN  15 06/04/2020   CREATININE 0.74 06/04/2020   BILITOT 0.6 06/04/2020   ALKPHOS 68 05/02/2019   AST 24 06/04/2020   ALT 21 06/04/2020   PROT 7.2 06/04/2020   ALBUMIN 4.3 05/02/2019   CALCIUM 9.2 06/04/2020   ANIONGAP 8 07/05/2018   GFR 82.01 05/02/2019   Lab Results  Component Value Date   CHOL 237 (H) 09/19/2020   Lab Results  Component Value Date   HDL 65.50 09/19/2020   Lab Results  Component Value Date   LDLCALC 153 (H) 09/19/2020   Lab Results  Component Value Date   TRIG 93.0 09/19/2020   Lab Results  Component Value Date   CHOLHDL 4 09/19/2020   No results found for: HGBA1C    Assessment & Plan:   Problem List Items Addressed This Visit   None  1. Other specified hypothyroidism She has found a good regimen between alternating every other day levothyroxine 100 mcg and then levothyroxine 88 mcg.  Feels like everything has been stable.  Would like to recheck labs today.  2. Dyslipidemia Her cholesterol has been elevated.  She has been taking pravastatin 20 mg daily.  We will recheck her fasting lipid panel today.  3. Primary insomnia She will take trazodone 50 mg 1/2 tablet at night as needed when she feels anxious and is having trouble sleeping.  This seems to be working well for her.  4. Essential hypertension Her blood pressure is to goal.  She is taking amlodipine 5 mg daily, HCTZ 25 mg daily, quinapril 40 mg daily.   Follow-up: No follow-ups on file.    Marthena Whitmyer M Yuvraj Pfeifer, PA-C

## 2021-05-26 NOTE — Patient Instructions (Signed)
Good to meet you today! Please go to the lab for blood work and I will send results through [facility]. Keep up great work taking care of yourself! Call if any concerns.

## 2021-05-27 ENCOUNTER — Encounter: Payer: Self-pay | Admitting: Physician Assistant

## 2021-05-28 ENCOUNTER — Other Ambulatory Visit: Payer: Self-pay

## 2021-05-28 ENCOUNTER — Ambulatory Visit (INDEPENDENT_AMBULATORY_CARE_PROVIDER_SITE_OTHER): Payer: Medicare HMO

## 2021-05-28 ENCOUNTER — Encounter: Payer: Self-pay | Admitting: Physician Assistant

## 2021-05-28 DIAGNOSIS — Z23 Encounter for immunization: Secondary | ICD-10-CM

## 2021-05-30 ENCOUNTER — Encounter: Payer: Self-pay | Admitting: Cardiology

## 2021-05-30 ENCOUNTER — Ambulatory Visit (INDEPENDENT_AMBULATORY_CARE_PROVIDER_SITE_OTHER): Payer: Medicare HMO | Admitting: Cardiology

## 2021-05-30 DIAGNOSIS — Z0000 Encounter for general adult medical examination without abnormal findings: Secondary | ICD-10-CM | POA: Diagnosis not present

## 2021-05-30 NOTE — Patient Instructions (Signed)
Health Maintenance, Female Adopting a healthy lifestyle and getting preventive care are important in promoting health and wellness. Ask your health care provider about: The right schedule for you to have regular tests and exams. Things you can do on your own to prevent diseases and keep yourself healthy. What should I know about diet, weight, and exercise? Eat a healthy diet  Eat a diet that includes plenty of vegetables, fruits, low-fat dairy products, and lean protein. Do not eat a lot of foods that are high in solid fats, added sugars, or sodium. Maintain a healthy weight Body mass index (BMI) is used to identify weight problems. It estimates body fat based on height and weight. Your health care provider can help determine your BMI and help you achieve or maintain a healthy weight. Get regular exercise Get regular exercise. This is one of the most important things you can do for your health. Most adults should: Exercise for at least 150 minutes each week. The exercise should increase your heart rate and make you sweat (moderate-intensity exercise). Do strengthening exercises at least twice a week. This is in addition to the moderate-intensity exercise. Spend less time sitting. Even light physical activity can be beneficial. Watch cholesterol and blood lipids Have your blood tested for lipids and cholesterol at 81 years of age, then have this test every 5 years. Have your cholesterol levels checked more often if: Your lipid or cholesterol levels are high. You are older than 81 years of age. You are at high risk for heart disease. What should I know about cancer screening? Depending on your health history and family history, you may need to have cancer screening at various ages. This may include screening for: Breast cancer. Cervical cancer. Colorectal cancer. Skin cancer. Lung cancer. What should I know about heart disease, diabetes, and high blood pressure? Blood pressure and heart  disease High blood pressure causes heart disease and increases the risk of stroke. This is more likely to develop in people who have high blood pressure readings, are of African descent, or are overweight. Have your blood pressure checked: Every 3-5 years if you are 18-39 years of age. Every year if you are 40 years old or older. Diabetes Have regular diabetes screenings. This checks your fasting blood sugar level. Have the screening done: Once every three years after age 40 if you are at a normal weight and have a low risk for diabetes. More often and at a younger age if you are overweight or have a high risk for diabetes. What should I know about preventing infection? Hepatitis B If you have a higher risk for hepatitis B, you should be screened for this virus. Talk with your health care provider to find out if you are at risk for hepatitis B infection. Hepatitis C Testing is recommended for: Everyone born from 1945 through 1965. Anyone with known risk factors for hepatitis C. Sexually transmitted infections (STIs) Get screened for STIs, including gonorrhea and chlamydia, if: You are sexually active and are younger than 81 years of age. You are older than 81 years of age and your health care provider tells you that you are at risk for this type of infection. Your sexual activity has changed since you were last screened, and you are at increased risk for chlamydia or gonorrhea. Ask your health care provider if you are at risk. Ask your health care provider about whether you are at high risk for HIV. Your health care provider may recommend a prescription medicine   to help prevent HIV infection. If you choose to take medicine to prevent HIV, you should first get tested for HIV. You should then be tested every 3 months for as long as you are taking the medicine. Pregnancy If you are about to stop having your period (premenopausal) and you may become pregnant, seek counseling before you get  pregnant. Take 400 to 800 micrograms (mcg) of folic acid every day if you become pregnant. Ask for birth control (contraception) if you want to prevent pregnancy. Osteoporosis and menopause Osteoporosis is a disease in which the bones lose minerals and strength with aging. This can result in bone fractures. If you are 65 years old or older, or if you are at risk for osteoporosis and fractures, ask your health care provider if you should: Be screened for bone loss. Take a calcium or vitamin D supplement to lower your risk of fractures. Be given hormone replacement therapy (HRT) to treat symptoms of menopause. Follow these instructions at home: Lifestyle Do not use any products that contain nicotine or tobacco, such as cigarettes, e-cigarettes, and chewing tobacco. If you need help quitting, ask your health care provider. Do not use street drugs. Do not share needles. Ask your health care provider for help if you need support or information about quitting drugs. Alcohol use Do not drink alcohol if: Your health care provider tells you not to drink. You are pregnant, may be pregnant, or are planning to become pregnant. If you drink alcohol: Limit how much you use to 0-1 drink a day. Limit intake if you are breastfeeding. Be aware of how much alcohol is in your drink. In the [place] one drink equals one 12 oz bottle of beer (355 mL), one 5 oz glass of wine (148 mL), or one 1 oz glass of hard liquor (44 mL). General instructions Schedule regular health, dental, and eye exams. Stay current with your vaccines. Tell your health care provider if: You often feel depressed. You have ever been abused or do not feel safe at home. Summary Adopting a healthy lifestyle and getting preventive care are important in promoting health and wellness. Follow your health care provider's instructions about healthy diet, exercising, and getting tested or screened for diseases. Follow your health care provider's  instructions on monitoring your cholesterol and blood pressure. This information is not intended to replace advice given to you by your health care provider. Make sure you discuss any questions you have with your health care provider. Document Revised: 11/07/2020 Document Reviewed: 08/23/2018 Elsevier Patient Education  2022 [employer]  

## 2021-05-30 NOTE — Progress Notes (Addendum)
Subjective:   Teresa Rangel is a 80 y.o. female who presents for Medicare Annual (Subsequent) preventive examination.  I connected with  Teresa Rangel on 05/30/21 by a audio enabled telemedicine application and verified that I am speaking with the correct person using two identifiers.   I discussed the limitations of evaluation and management by telemedicine. The patient expressed understanding and agreed to proceed.  Location of [place]: Home Location of Provider: Office Persons participating in the visit: Teresa Rangel and Rennis Harding, RN    Review of Systems    Defer to PCO Cardiac Risk Factors include: advanced age (>67mn, >>24women);dyslipidemia;hypertension     Objective:    Today's Vitals   05/30/21 1156  PainSc: 3    There is no height or weight on file to calculate BMI.  Advanced Directives 05/30/2021 05/02/2020 04/30/2019 07/04/2018 11/30/2015  Does Patient Have a Medical Advance Directive? Yes Yes No No No  Type of [place] will - - -  Does patient want to make changes to medical advance directive? No - Patient declined - - - -  Copy of H[place] Chart? No - copy requested No - copy requested - - -  Would patient like information on creating a medical advance directive? - - No - Patient declined No - Patient declined No - patient declined information    Current Medications (verified) Outpatient Encounter Medications as of 05/30/2021  Medication Sig   albuterol (VENTOLIN HFA) 108 (90 Base) MCG/ACT inhaler Inhale 2 puffs into the lungs every 6 (six) hours as needed for wheezing or shortness of breath.   allopurinol (ZYLOPRIM) 100 MG tablet TAKE ONE TABLET BY MOUTH DAILY--DO NOT TAKE DURING GOUT FLARE   amLODipine (NORVASC) 5 MG tablet TAKE ONE TABLET BY MOUTH DAILY   cholecalciferol (VITAMIN D) 1000 UNITS tablet Take 2,000 Units by mouth daily.    hydrochlorothiazide (HYDRODIURIL) 25 MG tablet TAKE ONE TABLET BY MOUTH DAILY   levothyroxine (SYNTHROID) 88 MCG tablet Take one tablet on Friday, Saturday and Sunday. Other days are 1025m. (Patient taking differently: Take one tablet  Daily)   pravastatin (PRAVACHOL) 20 MG tablet Take 1 tablet (20 mg total) by mouth daily.   quinapril (ACCUPRIL) 40 MG tablet TAKE ONE TABLET BY MOUTH EVERY NIGHT AT BEDTIME   traZODone (DESYREL) 50 MG tablet Take 0.5-1 tablets (25-50 mg total) by mouth at bedtime as needed for sleep.   vitamin C (ASCORBIC ACID) 500 MG tablet Take 500 mg by mouth daily.   [DISCONTINUED] levothyroxine (SYNTHROID) 100 MCG tablet Take 1 tablet (100 mcg total) by mouth daily.   [DISCONTINUED] conjugated estrogens (PREMARIN) vaginal cream Place 1 Applicatorful vaginally every 3 (three) days.   No facility-administered encounter medications on file as of 05/30/2021.    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   Benign    Past Surgical History:  Procedure Laterality Date   [place]  [place]   TONSILLECTOMY  1949   Family History  Problem Relation Age of Onset   Breast cancer Maternal AuAunt 55 Social History   Socioeconomic History  Marital status: Married    Spouse name: Not on file   Number of children: 2   Years of education: Not on file   Highest education level: Not on file  Occupational History   Occupation: Retired  Tobacco Use   Smoking status: Never   Smokeless tobacco: Never  Vaping Use   Vaping Use: Never used  Substance and Sexual Activity   Alcohol use: Yes    Alcohol/week: 1.0 standard drink    Types: 1 Glasses of wine per week    Comment: occassional   Drug use: No   Sexual  activity: Yes    Partners: Male  Other Topics Concern   Not on file  Social History Narrative   Part time Pharmacist, 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    Difficulty of Paying Living Expenses: Not hard at all  Food Insecurity: Unknown   Worried About Charity fundraiser in the Last Year: Not on file   [employer] of Food in the Last Year: Never true  Transportation Needs: No Transportation Needs   Lack of Transportation (Medical): No   Lack of Transportation (Non-Medical): No  Physical Activity: Inactive   Days of Exercise per Week: 0 days   Minutes of Exercise per Session: 0 min  Stress: No Stress Concern Present   Feeling of Stress : Not at all  Social Connections: Socially Integrated   Frequency of Communication with Friends and Family: More than three times a week   Frequency of Social Gatherings with Friends and Family: More than three times a week   Attends Religious Services: More than 4 times per year   Active Member of [employer] or Organizations: Yes   Attends Music therapist: More than 4 times per year   Marital Status: Married    Tobacco Counseling Counseling given: Not Answered   Clinical Intake:  Pre-visit preparation completed: Yes  Pain : 0-10 Pain Score: 3  Pain Type: Chronic pain (knee pain) Pain Location: Knee Pain Orientation: Left Pain Descriptors / Indicators: Other (Comment) (weak when walking) Pain Onset: Today Pain Frequency: Constant     Nutritional Risks: None Diabetes: No  How often do you need to have someone help you when you read instructions, pamphlets, or other written materials from your doctor or pharmacy?: 1 - Never What is the last grade level you completed in school?: 18 years  Diabetic?No  Interpreter Needed?: No  Information entered by :: Rennis Harding, RN   Activities of Daily Living In your present state of health, do you have any difficulty performing  the following activities: 05/30/2021  Hearing? N  Vision? N  Difficulty concentrating or making decisions? N  Walking or climbing stairs? Y  Comment knee issues  Dressing or bathing? N  Doing errands, shopping? N  Preparing Food and eating ? Y  Using the Toilet? Y  In the past six months, have you accidently leaked urine? N  Do you have problems with loss of bowel control? N  Managing your Medications? Y  Managing your Finances? Y  Housekeeping or managing your Housekeeping? Y  Some recent data might be hidden    Patient Care Team: Allwardt, Randa Evens, PA-C as PCP - General (Physician Assistant) Alyson Ingles Candee Furbish, MD as Consulting Physician (Urology) Princess Bruins, MD as Referring Physician (Ophthalmology)  Indicate any recent Medical Services you may have received from other than [facility] providers in the past year (  date may be approximate).     Assessment:   This is a routine wellness examination for Teresa Rangel.  Hearing/Vision screen No results found.  Dietary issues and exercise activities discussed: Current Exercise Habits: The patient does not participate in regular exercise at present, Exercise limited by: None identified   Goals Addressed   None   Depression Screen PHQ 2/9 Scores 05/30/2021 05/26/2021 02/06/2021 09/19/2020 06/04/2020 05/02/2020 04/30/2019  PHQ - 2 Score 0 0 1 4 0 0 0  PHQ- 9 Score - - 5 16 - - -    Fall Risk Fall Risk  05/30/2021 05/26/2021 09/19/2020 06/04/2020 05/02/2020  Falls in the past year? 0 0 0 0 0  Number falls in past yr: 0 0 - - 0  Injury with Fall? 0 0 - - 0  Risk for fall due to : No Fall Risks No Fall Risks No Fall Risks No Fall Risks Impaired vision  Follow up - Education provided - - Falls prevention discussed    FALL RISK PREVENTION PERTAINING TO THE HOME:  Any stairs in or around the home? Yes  If so, are there any without handrails? Yes  Home free of loose throw rugs in walkways, pet beds, electrical cords, etc? No  Adequate lighting in  your home to reduce risk of falls? Yes   ASSISTIVE DEVICES UTILIZED TO PREVENT FALLS:  Life alert? No  Use of a cane, walker or w/c? No  Grab bars in the bathroom? Yes  Shower chair or bench in shower? No  Elevated toilet seat or a handicapped toilet? Yes   TIMED UP AND GO:  Was the test performed?  N/A .  Length of time to ambulate 10 feet: N/A sec.     Cognitive Function:     6CIT Screen 05/30/2021 05/02/2020  What Year? 0 points 0 points  What month? 0 points 0 points  What time? 0 points -  Count back from 20 0 points 0 points  Months in reverse 0 points 0 points  Repeat phrase 0 points 0 points  Total Score 0 -    Immunizations Immunization History  Administered Date(s) Administered   Fluad Quad(high Dose 65+) 05/28/2021   Influenza, High Dose Seasonal PF 08/04/2019   PFIZER(Purple Top)SARS-COV-2 Vaccination 10/22/2019, 11/16/2019   Pneumococcal Conjugate-13 07/07/2018   Pneumococcal Polysaccharide-23 06/07/2013   Tetanus 06/07/2013    TDAP status: Up to date  Flu Vaccine status: Up to date  Pneumococcal vaccine status: Due, Education has been provided regarding the importance of this vaccine. Advised may receive this vaccine at local pharmacy or Health Dept. Aware to provide a copy of the vaccination record if obtained from local pharmacy or Health Dept. Verbalized acceptance and understanding.  Covid-19 vaccine status: Completed vaccines, 3rd/booster at [place]   Qualifies for Shingles Vaccine? No   Zostavax completed No   Shingrix Completed?: No.    Education has been provided regarding the importance of this vaccine. Patient has been advised to call insurance company to determine out of pocket expense if they have not yet received this vaccine. Advised may also receive vaccine at local pharmacy or Health Dept. Verbalized acceptance and understanding.  Screening Tests Health Maintenance  Topic Date Due   Zoster Vaccines- Shingrix (1 of 2) Never done    COVID-19 Vaccine (3 - Booster for Pfizer series) 06/11/2021 (Originally 04/17/2020)   MAMMOGRAM  04/10/2022   TETANUS/TDAP  06/08/2023   INFLUENZA VACCINE  Completed   DEXA SCAN  Completed   HPV  [place] Maintenance  Health Maintenance Due  Topic Date Due   Zoster Vaccines- Shingrix (1 of 2) Never done      Mammogram status: Completed  . Repeat every year  Bone Density status: Completed 2020. Results reflect: Bone density results: OSTEOPOROSIS. Repeat every 2 years.  Lung Cancer Screening: (Low Dose CT Chest recommended if Age 17-80 years, 30 pack-year currently smoking OR have quit w/in 15years.) does not qualify.   Lung Cancer Screening Referral: N/A  Additional Screening:  Hepatitis C Screening: does not qualify; Completed   Vision Screening: Recommended annual ophthalmology exams for early detection of glaucoma and other disorders of the eye. Is the patient up to date with their annual eye exam?  Yes  Who is the provider or what is the name of the office in which the patient attends annual eye exams? Dr Baird [facility] If pt is not established with a provider, would they like to be referred to a provider to establish care? No .   Dental Screening: Recommended annual dental exams for proper oral hygiene  Community Resource Referral / Chronic Care Management: CRR required this visit?  No   CCM required this visit?  No      Plan:     I have personally reviewed and noted the following in the patient's chart:   Medical and social history Use of alcohol, tobacco or illicit drugs  Current medications and supplements including opioid prescriptions.  Functional ability and status Nutritional status Physical activity Advanced directives List of other physicians Hospitalizations, surgeries, and ER visits in previous 12 months Vitals Screenings to include cognitive, depression, and falls Referrals and appointments  In addition, I have reviewed and  discussed with patient certain preventive protocols, quality metrics, and best practice recommendations. A written personalized care plan for preventive services as well as general preventive health recommendations were provided to patient.     Rennis Harding, RN   05/30/2021   Nurse Notes:  Non-Face to Face 40 minutes   Teresa Rangel , Thank you for taking time to come for your Medicare Wellness Visit. I appreciate your ongoing commitment to your health goals. Please review the following plan we discussed and let me know if I can assist you in the future.   These are the goals we discussed:  Goals      Patient Stated     Lose 5 lbs and be more active        This is a list of the screening recommended for you and due dates:  Health Maintenance  Topic Date Due   Zoster (Shingles) Vaccine (1 of 2) Never done   COVID-19 Vaccine (3 - Booster for Pfizer series) 06/11/2021*   Mammogram  04/10/2022   Tetanus Vaccine  06/08/2023   Flu Shot  Completed   DEXA scan (bone density measurement)  Completed   HPV Vaccine  Aged Out  *Topic was postponed. The date shown is not the original due date.

## 2021-06-01 DIAGNOSIS — H353221 Exudative age-related macular degeneration, left eye, with active choroidal neovascularization: Secondary | ICD-10-CM | POA: Diagnosis not present

## 2021-06-01 DIAGNOSIS — H353112 Nonexudative age-related macular degeneration, right eye, intermediate dry stage: Secondary | ICD-10-CM | POA: Diagnosis not present

## 2021-06-08 ENCOUNTER — Encounter: Payer: Self-pay | Admitting: Physician Assistant

## 2021-06-12 DIAGNOSIS — N3021 Other chronic cystitis with hematuria: Secondary | ICD-10-CM | POA: Diagnosis not present

## 2021-06-15 ENCOUNTER — Other Ambulatory Visit: Payer: Self-pay | Admitting: Family Medicine

## 2021-06-15 NOTE — Telephone Encounter (Signed)
Teresa Rangel, okay to refill Trazodone?

## 2021-06-16 DIAGNOSIS — N3021 Other chronic cystitis with hematuria: Secondary | ICD-10-CM | POA: Diagnosis not present

## 2021-06-16 DIAGNOSIS — N3000 Acute cystitis without hematuria: Secondary | ICD-10-CM | POA: Diagnosis not present

## 2021-06-17 ENCOUNTER — Other Ambulatory Visit: Payer: Self-pay | Admitting: Family Medicine

## 2021-06-18 DIAGNOSIS — M1712 Unilateral primary osteoarthritis, left knee: Secondary | ICD-10-CM | POA: Diagnosis not present

## 2021-06-18 DIAGNOSIS — M1711 Unilateral primary osteoarthritis, right knee: Secondary | ICD-10-CM | POA: Diagnosis not present

## 2021-06-29 NOTE — Progress Notes (Signed)
Left patient vm that I have changed the status of this appt.  Also, patient should not have received a no show fee.  I have asked if she would like to reschedule and if so to give our office a call back.

## 2021-07-06 ENCOUNTER — Encounter: Payer: Self-pay | Admitting: Physician Assistant

## 2021-07-06 ENCOUNTER — Other Ambulatory Visit: Payer: Self-pay | Admitting: Family Medicine

## 2021-07-06 MED ORDER — LEVOTHYROXINE SODIUM 88 MCG PO TABS: ORAL_TABLET | ORAL | 3 refills | Status: DC

## 2021-07-09 ENCOUNTER — Other Ambulatory Visit: Payer: Self-pay

## 2021-07-09 MED ORDER — LEVOTHYROXINE SODIUM 88 MCG PO TABS: 88.0000 ug | ORAL_TABLET | Freq: Every day | ORAL | 0 refills | Status: DC

## 2021-07-13 DIAGNOSIS — H353112 Nonexudative age-related macular degeneration, right eye, intermediate dry stage: Secondary | ICD-10-CM | POA: Diagnosis not present

## 2021-07-13 DIAGNOSIS — H353221 Exudative age-related macular degeneration, left eye, with active choroidal neovascularization: Secondary | ICD-10-CM | POA: Diagnosis not present

## 2021-07-16 ENCOUNTER — Other Ambulatory Visit: Payer: Self-pay | Admitting: Family Medicine

## 2021-07-26 ENCOUNTER — Other Ambulatory Visit: Payer: Self-pay | Admitting: Family Medicine

## 2021-08-07 ENCOUNTER — Encounter: Payer: Self-pay | Admitting: Physician Assistant

## 2021-08-13 ENCOUNTER — Telehealth: Payer: Self-pay

## 2021-08-13 NOTE — Telephone Encounter (Signed)
Pt called stating that she needs labs done for thyroid. Can orders be placed? Please Advise.

## 2021-08-14 ENCOUNTER — Other Ambulatory Visit: Payer: Self-pay

## 2021-08-14 DIAGNOSIS — R7989 Other specified abnormal findings of blood chemistry: Secondary | ICD-10-CM

## 2021-08-14 NOTE — Telephone Encounter (Signed)
Order placed for TSH

## 2021-08-14 NOTE — Telephone Encounter (Signed)
Pt is scheduled °

## 2021-08-18 ENCOUNTER — Other Ambulatory Visit: Payer: Self-pay

## 2021-08-18 ENCOUNTER — Other Ambulatory Visit (INDEPENDENT_AMBULATORY_CARE_PROVIDER_SITE_OTHER): Payer: Medicare HMO

## 2021-08-18 DIAGNOSIS — R7989 Other specified abnormal findings of blood chemistry: Secondary | ICD-10-CM

## 2021-08-18 DIAGNOSIS — J40 Bronchitis, not specified as acute or chronic: Secondary | ICD-10-CM | POA: Insufficient documentation

## 2021-08-18 LAB — TSH: TSH: 0.55 u[IU]/mL (ref 0.35–5.50)

## 2021-08-19 ENCOUNTER — Encounter: Payer: Self-pay | Admitting: Physician Assistant

## 2021-08-24 ENCOUNTER — Encounter: Payer: Self-pay | Admitting: Physician Assistant

## 2021-08-25 ENCOUNTER — Telehealth (INDEPENDENT_AMBULATORY_CARE_PROVIDER_SITE_OTHER): Payer: Medicare HMO | Admitting: Physician Assistant

## 2021-08-25 ENCOUNTER — Other Ambulatory Visit: Payer: Self-pay

## 2021-08-25 ENCOUNTER — Encounter: Payer: Self-pay | Admitting: Physician Assistant

## 2021-08-25 ENCOUNTER — Ambulatory Visit (INDEPENDENT_AMBULATORY_CARE_PROVIDER_SITE_OTHER)
Admission: RE | Admit: 2021-08-25 | Discharge: 2021-08-25 | Disposition: A | Discharge status: Home / Self Care | Payer: Medicare HMO | Source: Ambulatory Visit | Attending: Physician Assistant | Admitting: Physician Assistant

## 2021-08-25 DIAGNOSIS — S22089A Unspecified fracture of T11-T12 vertebra, initial encounter for closed fracture: Secondary | ICD-10-CM | POA: Diagnosis not present

## 2021-08-25 DIAGNOSIS — R059 Cough, unspecified: Secondary | ICD-10-CM | POA: Diagnosis not present

## 2021-08-25 DIAGNOSIS — R058 Other specified cough: Secondary | ICD-10-CM

## 2021-08-25 MED ORDER — PROMETHAZINE-DM 6.25-15 MG/5ML PO SYRP: 5.0000 mL | ORAL_SOLUTION | Freq: Every evening | ORAL | 0 refills | Status: DC | PRN

## 2021-08-25 MED ORDER — BENZONATATE 100 MG PO CAPS: 100.0000 mg | ORAL_CAPSULE | Freq: Three times a day (TID) | ORAL | 0 refills | Status: AC | PRN

## 2021-08-25 NOTE — Progress Notes (Signed)
Virtual Visit via Video Note  I connected with  Teresa Rangel  on 08/25/21 at 11:30 AM EST by a video enabled telemedicine application and verified that I am speaking with the correct person using two identifiers.  Location: Patient: home Provider: Therapist, music at [place] present: Patient and myself   I discussed the limitations of evaluation and management by telemedicine and the availability of in person appointments. The patient expressed understanding and agreed to proceed.   History of Present Illness:  Chief complaint: Cough Symptom onset: 1 week  Pertinent positives: Hx of bronchitis, congestion, productive cough green sputum Pertinent negatives: SOB, chest pain, fever, body aches, N/V/D  Treatments tried: Albuterol q 6 hrs, Mucinex x 6 days, Ibuprofen, Vit C, Delsym cough  Sick exposure: No known sick contacts   COVID-19 test has been negative at home     Observations/Objective:  BP 143/80 HR 76 bpm 98.1 F  Gen: Awake, alert, no acute distress Resp: Breathing is even and non-labored; productive intermittent cough Psych: calm/pleasant demeanor Neuro: Alert and Oriented x 3, + facial symmetry, speech is clear.   Assessment and Plan:  1. Cough productive of purulent sputum -Plan for CXR at [facility] today; pending results, may need antibiotics if evidence of PNA present -Tessalon perles and promethazine-DM to take as directed -Cont to push fluids, rest -Recheck in person prn    Follow Up Instructions:    I discussed the assessment and treatment plan with the patient. The patient was provided an opportunity to ask questions and all were answered. The patient agreed with the plan and demonstrated an understanding of the instructions.   The patient was advised to call back or seek an in-person evaluation if the symptoms worsen or if the condition fails to improve as anticipated.  Shaconda Hajduk M Gregoria Selvy, PA-C

## 2021-08-25 NOTE — Patient Instructions (Signed)
Please go to [facility] for chest xray and I will call with results.

## 2021-08-31 DIAGNOSIS — H353112 Nonexudative age-related macular degeneration, right eye, intermediate dry stage: Secondary | ICD-10-CM | POA: Diagnosis not present

## 2021-08-31 DIAGNOSIS — H35372 Puckering of macula, left eye: Secondary | ICD-10-CM | POA: Diagnosis not present

## 2021-08-31 DIAGNOSIS — H43813 Vitreous degeneration, bilateral: Secondary | ICD-10-CM | POA: Diagnosis not present

## 2021-08-31 DIAGNOSIS — H353221 Exudative age-related macular degeneration, left eye, with active choroidal neovascularization: Secondary | ICD-10-CM | POA: Diagnosis not present

## 2021-09-04 ENCOUNTER — Telehealth (INDEPENDENT_AMBULATORY_CARE_PROVIDER_SITE_OTHER): Payer: Medicare HMO | Admitting: Family Medicine

## 2021-09-04 ENCOUNTER — Encounter: Payer: Self-pay | Admitting: Family Medicine

## 2021-09-04 ENCOUNTER — Encounter: Payer: Self-pay | Admitting: Physician Assistant

## 2021-09-04 VITALS — BP 167/90 | HR 110 | Temp 100.5°F | Ht 64.0 in | Wt 159.4 lb

## 2021-09-04 DIAGNOSIS — U071 COVID-19: Secondary | ICD-10-CM

## 2021-09-04 MED ORDER — MOLNUPIRAVIR EUA 200MG CAPSULE: 4.0000 | ORAL_CAPSULE | Freq: Two times a day (BID) | ORAL | 0 refills | Status: AC

## 2021-09-04 NOTE — Patient Instructions (Signed)
Advised of CDC guidelines for self isolation/ ending isolation.  Advised of safe practice guidelines. Symptom Tier reviewed.  Encouraged to monitor for any worsening symptoms; watch for increased shortness of breath, weakness, and signs of dehydration. Advised when to seek emergency care.  Instructed to rest and hydrate well.  Advised to leave the house during recommended isolation period, only if it is necessary to seek medical care  

## 2021-09-04 NOTE — Telephone Encounter (Signed)
Patient seen virtually with Dr. Cherlynn Kaiser.

## 2021-09-04 NOTE — Progress Notes (Signed)
Virtual telephone visit    Virtual Visit via Telephone Note   This visit type was conducted due to national recommendations for restrictions regarding the COVID-19 Pandemic (e.g. social distancing) in an effort to limit this patient's exposure and mitigate transmission in our community. Due to her co-morbid illnesses, this patient is at least at moderate risk for complications without adequate follow up. This format is felt to be most appropriate for this patient at this time. The patient did not have access to video technology or had technical difficulties with video requiring transitioning to audio format only (telephone). Physical exam was limited to content and character of the telephone converstion. i was able to get the patient set up on a telephone visit.   Patient location: home Patient and provider in visit Provider location: Office  I discussed the limitations of evaluation and management by telemedicine and the availability of in person appointments. The patient expressed understanding and agreed to proceed.   Visit Date: 09/04/2021  Today's healthcare provider: Wellington Hampshire., MD     Subjective:    Patient ID: Teresa Rangel, female    DOB: 02/17/1941, 80 y.o.   MRN: [id number]  Chief Complaint  Patient presents with   Cough    Non productive Had a really bad night    Fever    Alternating between tylenol and ibuprofen    Nasal Congestion   Fatigue    HPI Patient sch  today for covid .  while talking w/husband at his appt(covid+), he told me pt dx covid test + this am.  Sick since last pm.  Cough, congestion, fever, aches, fatigue.  No sob.     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   Benign     Past Surgical History:  Procedure Laterality Date   [place]   THYROIDECTOMY  2007   TONSILLECTOMY  1949    Family History  Problem Relation Age of Onset   Breast cancer Maternal Aunt 30    Social History   Socioeconomic History   Marital status: Married    Spouse name: Not on file   Number of children: 2   Years of education: Not on file   Highest education level: Not on file  Occupational History   Occupation: Retired  Tobacco Use   Smoking status: Never   Smokeless tobacco: Never  Vaping Use   Vaping Use: Never used  Substance and Sexual Activity   Alcohol use: Yes    Alcohol/week: 1.0 standard drink    Types: 1 Glasses of wine per week    Comment: occassional   Drug use: No   Sexual activity: Yes    Partners: Male  Other Topics Concern   Not on file  Social History Narrative   Part time Pharmacist, 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    Difficulty of Paying Living Expenses: Not hard at all  Food Insecurity: Unknown   Worried About Charity fundraiser in the Last Year: Not on file   [employer] of Food in the Last Year: Never true  Transportation Needs: No Transportation Needs   Lack of Transportation (Medical): No   Lack  of Transportation (Non-Medical): No  Physical Activity: Inactive   Days of Exercise per Week: 0 days   Minutes of Exercise per Session: 0 min  Stress: No Stress Concern Present   Feeling of Stress : Not at all  Social Connections: Socially Integrated   Frequency of Communication with Friends and Family: More than three times a week   Frequency of Social Gatherings with Friends and Family: More than three times a week   Attends Religious Services: More than 4 times per year   Active Member of [employer] or Organizations: Yes   Attends Music therapist: More than 4 times per year   Marital Status: Married  Human resources officer Violence: Not At Risk   Fear of Current or Ex-Partner: No   Emotionally Abused: No   Physically Abused: No    Sexually Abused: No    Outpatient Medications Prior to Visit  Medication Sig Dispense Refill   albuterol (VENTOLIN HFA) 108 (90 Base) MCG/ACT inhaler INHALE TWO PUFFS BY MOUTH EVERY 6 HOURS AS NEEDED FOR WHEEZING OR FOR SHORTNESS OF BREATH 18 g 1   allopurinol (ZYLOPRIM) 100 MG tablet TAKE ONE TABLET BY MOUTH DAILY--DO NOT TAKE DURING GOUT FLARE 90 tablet 3   amLODipine (NORVASC) 5 MG tablet TAKE ONE TABLET BY MOUTH DAILY 90 tablet 3   cholecalciferol (VITAMIN D) 1000 UNITS tablet Take 2,000 Units by mouth daily.     hydrochlorothiazide (HYDRODIURIL) 25 MG tablet TAKE ONE TABLET BY MOUTH DAILY 90 tablet 3   levothyroxine (SYNTHROID) 88 MCG tablet Take 1 tablet (88 mcg total) by mouth daily before breakfast. 90 tablet 0   pravastatin (PRAVACHOL) 20 MG tablet TAKE ONE TABLET BY MOUTH DAILY 90 tablet 1   promethazine-dextromethorphan (PROMETHAZINE-DM) 6.25-15 MG/5ML syrup Take 5 mLs by mouth at bedtime as needed for cough. 120 mL 0   quinapril (ACCUPRIL) 40 MG tablet TAKE ONE TABLET BY MOUTH EVERY NIGHT AT BEDTIME 90 tablet 3   traZODone (DESYREL) 50 MG tablet TAKE 1/2 TO 1 TABLET AT BEDTIME AS NEEDED FOR SLEEP 30 tablet 2   vitamin C (ASCORBIC ACID) 500 MG tablet Take 500 mg by mouth daily.     No facility-administered medications prior to visit.    Allergies  Allergen Reactions   Tetracyclines & Related     Stomach cramps    ROS     Objective:    Physical Exam  BP (!) 167/90    Pulse (!) 110    Temp (!) 100.5 F (38.1 C) (Oral)    Ht 5\' 4"  (1.626 m)    Wt 159 lb 6.1 oz (72.3 kg)    LMP  (LMP Unknown)    SpO2 92%    BMI 27.36 kg/m  Wt Readings from Last 3 Encounters:  09/04/21 159 lb 6.1 oz (72.3 kg)  05/26/21 159 lb 6.1 oz (72.3 kg)  02/06/21 161 lb (73 kg)    Lab Results  Component Value Date   WBC 5.7 05/26/2021   HGB 14.5 05/26/2021   HCT 42.3 05/26/2021   PLT 221.0 05/26/2021   GLUCOSE 85 05/26/2021   CHOL 183 05/26/2021   TRIG 119.0 05/26/2021   HDL 55.70  05/26/2021   LDLDIRECT 133.1 12/07/2012   LDLCALC 103 (H) 05/26/2021   ALT 16 05/26/2021   AST 20 05/26/2021   NA 140 05/26/2021   K 3.1 (L) 05/26/2021   CL 100 05/26/2021   CREATININE 0.69 05/26/2021   BUN 17 05/26/2021   CO2 32 05/26/2021  TSH 0.55 08/18/2021   MICROALBUR 2.0 (H) 05/02/2019    Lab Results  Component Value Date   TSH 0.55 08/18/2021   Lab Results  Component Value Date   WBC 5.7 05/26/2021   HGB 14.5 05/26/2021   HCT 42.3 05/26/2021   MCV 82.7 05/26/2021   PLT 221.0 05/26/2021   Lab Results  Component Value Date   NA 140 05/26/2021   K 3.1 (L) 05/26/2021   CO2 32 05/26/2021   GLUCOSE 85 05/26/2021   BUN 17 05/26/2021   CREATININE 0.69 05/26/2021   BILITOT 0.7 05/26/2021   ALKPHOS 60 05/26/2021   AST 20 05/26/2021   ALT 16 05/26/2021   PROT 6.7 05/26/2021   ALBUMIN 4.0 05/26/2021   CALCIUM 9.3 05/26/2021   ANIONGAP 8 07/05/2018   GFR 81.41 05/26/2021   Lab Results  Component Value Date   CHOL 183 05/26/2021   Lab Results  Component Value Date   HDL 55.70 05/26/2021   Lab Results  Component Value Date   LDLCALC 103 (H) 05/26/2021   Lab Results  Component Value Date   TRIG 119.0 05/26/2021   Lab Results  Component Value Date   CHOLHDL 3 05/26/2021   No results found for: HGBA1C     Assessment & Plan:   Problem List Items Addressed This Visit   None Visit Diagnoses     COVID-19    -  Primary   Relevant Medications   molnupiravir EUA (LAGEVRIO) 200 mg CAPS capsule      Covid-d/t comorbidities/age, will tx.  Aware of warning signs, quarantine discussed  I am having Quincy Simmonds. Echeverri start on molnupiravir EUA. I am also having her maintain her cholecalciferol, vitamin C, quinapril, allopurinol, hydrochlorothiazide, amLODipine, traZODone, albuterol, levothyroxine, pravastatin, and promethazine-dextromethorphan.  Meds ordered this encounter  Medications   molnupiravir EUA (LAGEVRIO) 200 mg CAPS capsule    Sig: Take 4  capsules (800 mg total) by mouth 2 (two) times daily for 5 days.    Dispense:  40 capsule    Refill:  0     I discussed the assessment and treatment plan with the patient. The patient was provided an opportunity to ask questions and all were answered. The patient agreed with the plan and demonstrated an understanding of the instructions.   The patient was advised to call back or seek an in-person evaluation if the symptoms worsen or if the condition fails to improve as anticipated.  I provided 10 minutes of non-face-to-face time during this encounter.   Wellington Hampshire., MD [place] [phone] (phone) [phone] (fax)  [place]

## 2021-09-17 ENCOUNTER — Encounter: Payer: Self-pay | Admitting: Physician Assistant

## 2021-09-17 ENCOUNTER — Other Ambulatory Visit: Payer: Self-pay | Admitting: Physician Assistant

## 2021-09-17 MED ORDER — PROMETHAZINE-DM 6.25-15 MG/5ML PO SYRP: 5.0000 mL | ORAL_SOLUTION | Freq: Every evening | ORAL | 0 refills | Status: DC | PRN

## 2021-09-24 DIAGNOSIS — H353121 Nonexudative age-related macular degeneration, left eye, early dry stage: Secondary | ICD-10-CM | POA: Diagnosis not present

## 2021-09-24 DIAGNOSIS — H353211 Exudative age-related macular degeneration, right eye, with active choroidal neovascularization: Secondary | ICD-10-CM | POA: Diagnosis not present

## 2021-09-24 DIAGNOSIS — Z961 Presence of intraocular lens: Secondary | ICD-10-CM | POA: Diagnosis not present

## 2021-09-24 DIAGNOSIS — H52203 Unspecified astigmatism, bilateral: Secondary | ICD-10-CM | POA: Diagnosis not present

## 2021-10-01 ENCOUNTER — Encounter: Payer: Self-pay | Admitting: Physician Assistant

## 2021-10-04 ENCOUNTER — Other Ambulatory Visit: Payer: Self-pay | Admitting: Physician Assistant

## 2021-10-04 DIAGNOSIS — R053 Chronic cough: Secondary | ICD-10-CM

## 2021-10-06 ENCOUNTER — Encounter: Payer: Self-pay | Admitting: Physician Assistant

## 2021-10-07 ENCOUNTER — Other Ambulatory Visit: Payer: Self-pay

## 2021-10-07 MED ORDER — FLUTICASONE PROPIONATE 50 MCG/ACT NA SUSP: 2.0000 | Freq: Every day | NASAL | 6 refills | Status: DC

## 2021-10-12 DIAGNOSIS — H353112 Nonexudative age-related macular degeneration, right eye, intermediate dry stage: Secondary | ICD-10-CM | POA: Diagnosis not present

## 2021-10-12 DIAGNOSIS — H353221 Exudative age-related macular degeneration, left eye, with active choroidal neovascularization: Secondary | ICD-10-CM | POA: Diagnosis not present

## 2021-10-27 ENCOUNTER — Encounter: Payer: Self-pay | Admitting: Pulmonary Disease

## 2021-10-27 ENCOUNTER — Other Ambulatory Visit: Payer: Self-pay

## 2021-10-27 ENCOUNTER — Ambulatory Visit: Payer: Medicare HMO | Admitting: Pulmonary Disease

## 2021-10-27 VITALS — BP 138/76 | HR 80 | Temp 98.8°F | Ht 65.0 in | Wt 162.0 lb

## 2021-10-27 DIAGNOSIS — R053 Chronic cough: Secondary | ICD-10-CM

## 2021-10-27 DIAGNOSIS — J4520 Mild intermittent asthma, uncomplicated: Secondary | ICD-10-CM | POA: Diagnosis not present

## 2021-10-27 MED ORDER — FLUTICASONE FUROATE-VILANTEROL 200-25 MCG/ACT IN AEPB: 1.0000 | INHALATION_SPRAY | Freq: Every day | RESPIRATORY_TRACT | 11 refills | Status: DC

## 2021-10-27 NOTE — Progress Notes (Signed)
@Patient  ID: Teresa Rangel, female    DOB: September 13, 1941, 81 y.o.   MRN: [id number]  Chief Complaint  Patient presents with   Consult    Consult for chronic cough. Pt states that her coughing is a lot better. Pt states that she has a history of bronchitis. But right now she is not coughing that much anymore. She did have covid in december.     Referring provider: Allwardt, Randa Evens, PA-C  HPI:   81 y.o. woman whom we are seeing in consultation for evaluation of chronic cough.  Note from referring provider reviewed.  Patient is prior to her current bouts of cough throughout the year.  Usually 2 or 3 times a year.  Often with upper respiratory illness.  Anytime she gets a cold it settles in her chest and she gets bronchitis per her report.  Often treated with azithromycin.  Occasionally addition of prednisone.  No prednisone the last year or so per her report.  Gradually her symptoms improved.  Albuterol use as needed also helps things.  She notes history with seasonal allergies.  Often times pollens will trigger bronchitis-like symptoms as described above.  Worse in the fall and spring.  Another flare late 08/2021 into early 09/2021.  Associate with COVID infection.  Cough is worse.  This gradually improved and nearly resolved.  She coughs a few times in the room.  She is never needed maintenance inhaler.  Never formally diagnosed with asthma.  Chest imaging chest x-ray 08/2021 demonstrates clear lungs bilaterally on my interpretation.  PMH: Hypertension, hyperlipidemia, seasonal allergy, OSA, chronic bronchitis Surgical history: Reviewed with patient, denies any Family history: First-degree relatives with history of emphysema, asthma Social history: Never smoker, lives in [place] / Pulmonary Flowsheets:   ACT:  No flowsheet data found.  MMRC: No flowsheet data found.  Epworth:  No flowsheet data found.  Tests:   FENO:  No results found for:  NITRICOXIDE  PFT: No flowsheet data found.  WALK:  No flowsheet data found.  Imaging: Personally reviewed and as per EMR discussion this note  Lab Results: Personally reviewed CBC    Component Value Date/Time   WBC 5.7 05/26/2021 0907   RBC 5.12 (H) 05/26/2021 0907   HGB 14.5 05/26/2021 0907   HCT 42.3 05/26/2021 0907   PLT 221.0 05/26/2021 0907   MCV 82.7 05/26/2021 0907   MCH 28.7 06/04/2020 1215   MCHC 34.2 05/26/2021 0907   RDW 13.0 05/26/2021 0907   LYMPHSABS 1.6 05/26/2021 0907   MONOABS 0.5 05/26/2021 0907   EOSABS 0.2 05/26/2021 0907   BASOSABS 0.0 05/26/2021 0907    BMET    Component Value Date/Time   NA 140 05/26/2021 0907   K 3.1 (L) 05/26/2021 0907   CL 100 05/26/2021 0907   CO2 32 05/26/2021 0907   GLUCOSE 85 05/26/2021 0907   BUN 17 05/26/2021 0907   CREATININE 0.69 05/26/2021 0907   CREATININE 0.74 06/04/2020 1215   CALCIUM 9.3 05/26/2021 0907   GFRNONAA 77 06/04/2020 1215   GFRAA 89 06/04/2020 1215    BNP No results found for: BNP  ProBNP    Component Value Date/Time   PROBNP 77.3 01/01/2013 0100    Specialty Problems   None   Allergies  Allergen Reactions   Tetracyclines & Related     Stomach cramps    Immunization History  Administered Date(s) Administered   Fluad Quad(high Dose 65+) 05/28/2021   Influenza, High Dose Seasonal PF 08/04/2019  PFIZER(Purple Top)SARS-COV-2 Vaccination 10/22/2019, 11/16/2019   Pneumococcal Conjugate-13 07/07/2018   Pneumococcal Polysaccharide-23 06/07/2013   Tetanus 06/07/2013    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   Benign     Tobacco History: Social History   Tobacco Use  Smoking Status Never  Smokeless Tobacco Never   Counseling given: Not Answered   Continue to not smoke  Outpatient Encounter Medications as of  10/27/2021  Medication Sig   albuterol (VENTOLIN HFA) 108 (90 Base) MCG/ACT inhaler INHALE TWO PUFFS BY MOUTH EVERY 6 HOURS AS NEEDED FOR WHEEZING OR FOR SHORTNESS OF BREATH   allopurinol (ZYLOPRIM) 100 MG tablet TAKE ONE TABLET BY MOUTH DAILY--DO NOT TAKE DURING GOUT FLARE   amLODipine (NORVASC) 5 MG tablet TAKE ONE TABLET BY MOUTH DAILY   cholecalciferol (VITAMIN D) 1000 UNITS tablet Take 2,000 Units by mouth daily.   fluticasone (FLONASE) 50 MCG/ACT nasal spray Place 2 sprays into both nostrils daily.   fluticasone furoate-vilanterol (BREO ELLIPTA) 200-25 MCG/ACT AEPB Inhale 1 puff into the lungs daily.   hydrochlorothiazide (HYDRODIURIL) 25 MG tablet TAKE ONE TABLET BY MOUTH DAILY   pravastatin (PRAVACHOL) 20 MG tablet TAKE ONE TABLET BY MOUTH DAILY   quinapril (ACCUPRIL) 40 MG tablet TAKE ONE TABLET BY MOUTH EVERY NIGHT AT BEDTIME   traZODone (DESYREL) 50 MG tablet TAKE 1/2 TO 1 TABLET AT BEDTIME AS NEEDED FOR SLEEP   vitamin C (ASCORBIC ACID) 500 MG tablet Take 500 mg by mouth daily.   levothyroxine (SYNTHROID) 88 MCG tablet Take 1 tablet (88 mcg total) by mouth daily before breakfast.   promethazine-dextromethorphan (PROMETHAZINE-DM) 6.25-15 MG/5ML syrup Take 5 mLs by mouth at bedtime as needed for cough. (Patient not taking: Reported on 10/27/2021)   No facility-administered encounter medications on file as of 10/27/2021.     Review of Systems  Review of Systems   Physical Exam  BP 138/76 (BP Location: Right Arm, Patient Position: Sitting, Cuff Size: Normal)    Pulse 80    Temp 98.8 F (37.1 C) (Oral)    Ht 5\' 5"  (1.651 m)    Wt 162 lb (73.5 kg)    LMP  (LMP Unknown)    SpO2 98%    BMI 26.96 kg/m   Wt Readings from Last 5 Encounters:  10/27/21 162 lb (73.5 kg)  09/04/21 159 lb 6.1 oz (72.3 kg)  05/26/21 159 lb 6.1 oz (72.3 kg)  02/06/21 161 lb (73 kg)  09/19/20 165 lb 12.8 oz (75.2 kg)    BMI Readings from Last 5 Encounters:  10/27/21 26.96 kg/m  09/04/21 27.36 kg/m   05/26/21 27.36 kg/m  02/06/21 27.64 kg/m  09/19/20 28.46 kg/m     Physical Exam General: Well-appearing, no acute distress Eyes: EOMI, no icterus Neck: Supple, JVP Pulmonary: Clear, normal work of breathing Cardiovascular: Regular in rhythm, no murmur Neuro: Normal gait, no weakness MSK: No synovitis, no joint effusion Abdomen: Nondistended, bowel sounds present Psych: Normal mood, full affect   Assessment & Plan:   Cough: In the setting of COVID infection.  Often recurs seasonally as well with upper respiratory infections.  High suspicion for cough variant asthma given recurrent episodes of this and bronchitis.  Chest imaging clear 08/2021.  Asthma: Based on recurrent bronchitis, cough with seasonal changes and pollen as well as upper respiratory illnesses.  At least once a year, often 2-3.  Treated numerous times with azithromycin, has used prednisone at times although none in the last year.  Discussed role and rationale for maintenance inhaler.  Prescribed Breo 1 puff daily.  Instructed her to continue albuterol 2 puffs every 4-6 hours as needed for cough or other asthma symptoms such as chest tightness or shortness of breath.   Return in about 3 months (around 01/24/2022).   Lanier Clam, MD 10/27/2021

## 2021-10-27 NOTE — Patient Instructions (Signed)
Nice to meet you  Use Breo 1 puff once a day - rinse mouth after every use   Continue albuterol 2 puffs every 4-6 hours as needed for cough or chest tightness or shortness of breath  Return to clinic in 3 months or sooner as needed

## 2021-10-30 ENCOUNTER — Other Ambulatory Visit: Payer: Self-pay | Admitting: Family Medicine

## 2021-11-01 ENCOUNTER — Encounter: Payer: Self-pay | Admitting: Physician Assistant

## 2021-11-02 ENCOUNTER — Other Ambulatory Visit: Payer: Self-pay

## 2021-11-02 MED ORDER — LEVOTHYROXINE SODIUM 88 MCG PO TABS: 88.0000 ug | ORAL_TABLET | Freq: Every day | ORAL | 0 refills | Status: DC

## 2021-11-05 DIAGNOSIS — L821 Other seborrheic keratosis: Secondary | ICD-10-CM | POA: Diagnosis not present

## 2021-11-05 DIAGNOSIS — D1801 Hemangioma of skin and subcutaneous tissue: Secondary | ICD-10-CM | POA: Diagnosis not present

## 2021-11-05 DIAGNOSIS — L718 Other rosacea: Secondary | ICD-10-CM | POA: Diagnosis not present

## 2021-11-05 DIAGNOSIS — L853 Xerosis cutis: Secondary | ICD-10-CM | POA: Diagnosis not present

## 2021-11-05 DIAGNOSIS — L720 Epidermal cyst: Secondary | ICD-10-CM | POA: Diagnosis not present

## 2021-11-23 DIAGNOSIS — H35372 Puckering of macula, left eye: Secondary | ICD-10-CM | POA: Diagnosis not present

## 2021-11-23 DIAGNOSIS — H353112 Nonexudative age-related macular degeneration, right eye, intermediate dry stage: Secondary | ICD-10-CM | POA: Diagnosis not present

## 2021-11-23 DIAGNOSIS — H353221 Exudative age-related macular degeneration, left eye, with active choroidal neovascularization: Secondary | ICD-10-CM | POA: Diagnosis not present

## 2021-11-23 DIAGNOSIS — H43813 Vitreous degeneration, bilateral: Secondary | ICD-10-CM | POA: Diagnosis not present

## 2021-12-07 ENCOUNTER — Encounter: Payer: Self-pay | Admitting: Physician Assistant

## 2021-12-07 ENCOUNTER — Other Ambulatory Visit: Payer: Self-pay | Admitting: Physician Assistant

## 2021-12-07 MED ORDER — LEVOTHYROXINE SODIUM 88 MCG PO TABS: 88.0000 ug | ORAL_TABLET | Freq: Every day | ORAL | 0 refills | Status: DC

## 2021-12-13 ENCOUNTER — Encounter: Payer: Self-pay | Admitting: Physician Assistant

## 2021-12-14 ENCOUNTER — Other Ambulatory Visit: Payer: Self-pay | Admitting: Physician Assistant

## 2021-12-14 MED ORDER — FOSINOPRIL SODIUM 40 MG PO TABS: 40.0000 mg | ORAL_TABLET | Freq: Every day | ORAL | 1 refills | Status: DC

## 2021-12-14 MED ORDER — LEVOTHYROXINE SODIUM 88 MCG PO TABS: 88.0000 ug | ORAL_TABLET | Freq: Every day | ORAL | 3 refills | Status: DC

## 2021-12-14 NOTE — Telephone Encounter (Signed)
Please advise 

## 2022-01-12 ENCOUNTER — Other Ambulatory Visit: Payer: Self-pay

## 2022-01-12 ENCOUNTER — Other Ambulatory Visit: Payer: Self-pay | Admitting: Family Medicine

## 2022-01-14 ENCOUNTER — Encounter: Payer: Self-pay | Admitting: Physician Assistant

## 2022-01-25 ENCOUNTER — Ambulatory Visit: Payer: Medicare HMO | Admitting: Pulmonary Disease

## 2022-01-25 DIAGNOSIS — H353221 Exudative age-related macular degeneration, left eye, with active choroidal neovascularization: Secondary | ICD-10-CM | POA: Diagnosis not present

## 2022-01-27 ENCOUNTER — Other Ambulatory Visit: Payer: Self-pay

## 2022-01-27 MED ORDER — PRAVASTATIN SODIUM 20 MG PO TABS: 20.0000 mg | ORAL_TABLET | Freq: Every day | ORAL | 0 refills | Status: DC

## 2022-02-02 ENCOUNTER — Ambulatory Visit (INDEPENDENT_AMBULATORY_CARE_PROVIDER_SITE_OTHER): Payer: Medicare HMO | Admitting: Physician Assistant

## 2022-02-02 VITALS — BP 127/76 | HR 70 | Temp 97.9°F | Ht 65.0 in | Wt 159.8 lb

## 2022-02-02 DIAGNOSIS — E038 Other specified hypothyroidism: Secondary | ICD-10-CM | POA: Diagnosis not present

## 2022-02-02 DIAGNOSIS — F5101 Primary insomnia: Secondary | ICD-10-CM | POA: Diagnosis not present

## 2022-02-02 DIAGNOSIS — R69 Illness, unspecified: Secondary | ICD-10-CM | POA: Diagnosis not present

## 2022-02-02 DIAGNOSIS — I10 Essential (primary) hypertension: Secondary | ICD-10-CM

## 2022-02-02 DIAGNOSIS — E785 Hyperlipidemia, unspecified: Secondary | ICD-10-CM | POA: Diagnosis not present

## 2022-02-02 DIAGNOSIS — R053 Chronic cough: Secondary | ICD-10-CM | POA: Insufficient documentation

## 2022-02-02 LAB — TSH: TSH: 0.71 u[IU]/mL (ref 0.35–5.50)

## 2022-02-02 LAB — CBC WITH DIFFERENTIAL/PLATELET
Basophils Absolute: 0.1 10*3/uL (ref 0.0–0.1)
Basophils Relative: 0.9 % (ref 0.0–3.0)
Eosinophils Absolute: 0.3 10*3/uL (ref 0.0–0.7)
Eosinophils Relative: 4.7 % (ref 0.0–5.0)
HCT: 42.9 % (ref 36.0–46.0)
Hemoglobin: 14.7 g/dL (ref 12.0–15.0)
Lymphocytes Relative: 26.4 % (ref 12.0–46.0)
Lymphs Abs: 1.7 10*3/uL (ref 0.7–4.0)
MCHC: 34.2 g/dL (ref 30.0–36.0)
MCV: 84.3 fl (ref 78.0–100.0)
Monocytes Absolute: 0.6 10*3/uL (ref 0.1–1.0)
Monocytes Relative: 9 % (ref 3.0–12.0)
Neutro Abs: 3.8 10*3/uL (ref 1.4–7.7)
Neutrophils Relative %: 59 % (ref 43.0–77.0)
Platelets: 228 10*3/uL (ref 150.0–400.0)
RBC: 5.09 Mil/uL (ref 3.87–5.11)
RDW: 13.2 % (ref 11.5–15.5)
WBC: 6.4 10*3/uL (ref 4.0–10.5)

## 2022-02-02 LAB — COMPREHENSIVE METABOLIC PANEL
ALT: 15 U/L (ref 0–35)
AST: 20 U/L (ref 0–37)
Albumin: 4.3 g/dL (ref 3.5–5.2)
Alkaline Phosphatase: 56 U/L (ref 39–117)
BUN: 19 mg/dL (ref 6–23)
CO2: 30 mEq/L (ref 19–32)
Calcium: 9.5 mg/dL (ref 8.4–10.5)
Chloride: 101 mEq/L (ref 96–112)
Creatinine, Ser: 0.72 mg/dL (ref 0.40–1.20)
GFR: 78.06 mL/min (ref >60.00)
Glucose, Bld: 92 mg/dL (ref 70–99)
Potassium: 3.5 mEq/L (ref 3.5–5.1)
Sodium: 140 mEq/L (ref 135–145)
Total Bilirubin: 0.9 mg/dL (ref 0.2–1.2)
Total Protein: 6.9 g/dL (ref 6.0–8.3)

## 2022-02-02 LAB — LIPID PANEL
Cholesterol: 189 mg/dL (ref 0–200)
HDL: 55.4 mg/dL (ref >39.00)
LDL Cholesterol: 111 mg/dL — ABNORMAL HIGH (ref 0–99)
NonHDL: 134.05
Total CHOL/HDL Ratio: 3
Triglycerides: 116 mg/dL (ref 0.0–149.0)
VLDL: 23.2 mg/dL (ref 0.0–40.0)

## 2022-02-02 NOTE — Progress Notes (Signed)
Subjective:    Patient ID: Teresa Rangel, female    DOB: 1940-12-09, 81 y.o.   MRN: [id number]  Chief Complaint  Patient presents with   Follow-up    Pt being seen as follow up pt is requesting blood work for thyroid check and Lipids; pt has hx of chronic cough and on new inhaler daily    HPI Patient is in today for regular follow-up. Fasting this morning. Needing labs checked.   Taking her medications as directed. No new symptoms or concerns to report today. See A/P for details of chronic conditions discussed.   Interim hx: -Using Breo for chronic cough - helping tremendously - Rx from Dr. 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   Benign     Past Surgical History:  Procedure Laterality Date   [place]   THYROIDECTOMY  2007   TONSILLECTOMY  1949    Family History  Problem Relation Age of Onset   Breast cancer Maternal Aunt 89    Social History   Tobacco Use   Smoking status: Never   Smokeless tobacco: Never  Vaping Use   Vaping Use: Never used  Substance Use Topics   Alcohol use: Yes    Alcohol/week: 1.0 standard drink    Types: 1 Glasses of wine per week    Comment: occassional   Drug use: No     Allergies  Allergen Reactions   Tetracyclines & Related     Stomach cramps    Review of Systems NEGATIVE UNLESS OTHERWISE INDICATED IN HPI      Objective:     BP 127/76 (BP Location: Left Arm)   Pulse 70   Temp 97.9 F (36.6 C) (Temporal)   Ht '5\' 5"'$  (1.651 m)   Wt 159 lb 12.8 oz (72.5 kg)   LMP  (LMP Unknown)   SpO2 97%   BMI 26.59 kg/m   Wt Readings from Last 3 Encounters:   02/02/22 159 lb 12.8 oz (72.5 kg)  10/27/21 162 lb (73.5 kg)  09/04/21 159 lb 6.1 oz (72.3 kg)    BP Readings from Last 3 Encounters:  02/02/22 127/76  10/27/21 138/76  09/04/21 (!) 167/90     Physical Exam Vitals and nursing note reviewed.  Constitutional:      Appearance: Normal appearance. She is normal weight. She is not toxic-appearing.  HENT:     Head: Normocephalic and atraumatic.     Right Ear: External ear normal.     Left Ear: External ear normal.     Nose: Nose normal.     Mouth/Throat:     Mouth: Mucous membranes are moist.  Eyes:     Extraocular Movements: Extraocular movements intact.     Conjunctiva/sclera: Conjunctivae normal.     Pupils: Pupils are equal, round, and reactive to light.  Cardiovascular:     Rate and Rhythm: Normal rate and regular rhythm.     Pulses: Normal pulses.     Heart sounds: Normal heart sounds.  Pulmonary:     Effort: Pulmonary effort is normal.  Breath sounds: Normal breath sounds.  Musculoskeletal:     Cervical back: Normal range of motion and neck supple.  Skin:    General: Skin is warm and dry.  Neurological:     General: No focal deficit present.     Mental Status: She is alert and oriented to person, place, and time.  Psychiatric:        Mood and Affect: Mood normal.        Behavior: Behavior normal.       Assessment & Plan:   Problem List Items Addressed This Visit       Cardiovascular and Mediastinum   Essential hypertension - Primary   Relevant Orders   CBC with Differential/Platelet (Completed)   Comprehensive metabolic panel (Completed)     Endocrine   Hypothyroidism   Relevant Orders   TSH (Completed)     Other   Dyslipidemia   Relevant Orders   Lipid panel (Completed)   Chronic cough   Primary insomnia   Relevant Orders   CBC with Differential/Platelet (Completed)   Comprehensive metabolic panel (Completed)    1. Essential hypertension Blood pressure is stable and at goal.  Encouraged  her to continue monitoring at home.  She is going to continue on amlodipine 5 mg, fosinopril 40 mg, hydrochlorothiazide 25 mg.  2. Chronic cough Patient reports that symptoms have improved.  She is doing well with Breo.  3. Dyslipidemia Plan to recheck fasting lipid panel today.  She is currently taking pravastatin 20 mg.  She tries to work on healthy lifestyle habits.  4. Primary insomnia Patient is doing well with trazodone 50 mg at bedtime.  5. Other specified hypothyroidism Plan to recheck TSH and adjust accordingly.  She is currently taking Synthroid 88 mcg daily.    Return in about 6 months (around 08/05/2022) for recheck .  This note was prepared with assistance of Systems analyst. Occasional wrong-word or sound-a-like substitutions may have occurred due to the inherent limitations of voice recognition software.  Time Spent: 31 minutes of total time was spent on the date of the encounter performing the following actions: chart review prior to seeing the patient, obtaining history, performing a medically necessary exam, counseling on the treatment plan, placing orders, and documenting in our EHR.       Zyonna Vardaman M Jyssica Rief, PA-C

## 2022-02-02 NOTE — Patient Instructions (Signed)
Good to see you as always!  Please go to the lab for blood work and I will send results through [place].  Have a great summer!   Call if any concerns.

## 2022-02-10 ENCOUNTER — Ambulatory Visit: Payer: Medicare HMO | Admitting: Pulmonary Disease

## 2022-02-25 ENCOUNTER — Ambulatory Visit: Payer: Medicare HMO | Admitting: Pulmonary Disease

## 2022-02-25 ENCOUNTER — Encounter: Payer: Self-pay | Admitting: Pulmonary Disease

## 2022-02-25 VITALS — BP 124/66 | HR 68 | Temp 98.4°F | Ht 64.0 in | Wt 160.2 lb

## 2022-02-25 DIAGNOSIS — R053 Chronic cough: Secondary | ICD-10-CM | POA: Diagnosis not present

## 2022-02-25 NOTE — Progress Notes (Signed)
$'@Patient'N$  ID: Teresa Rangel, female    DOB: [birth year], 81 y.o.   MRN: [id number]  Chief Complaint  Patient presents with   Follow-up    Follow up for cough. Pt states that her cough is better. She only has the cough in the mornings now and sometimes in the evening. Pt states that she does have some mucus with the cough. Pt is on Breo daily. Albuterol and Flonase as needed.     Referring provider: Allwardt, Randa Evens, PA-C  HPI:   81 y.o. woman whom we are seeing in follow up for evaluation of chronic cough.  Most recent PCP note reviewed.  Returns for scheduled f/u. Concern for asthma in addition to nasal congestion, post nasal drip contributing to cough.  Prescribed Breo at last visit.  Seems cough is improved in the interim.  Has some ongoing rhinorrhea.  Productive cough in the morning mainly.  Things collecting from overnight when lying supine.  Pleased with response and cough.  Notes that typically she gets bronchitis in the early fall and sometimes in the winter.  True test will be how the next few months ago.  HPI at initial visit: Patient is prior to her current bouts of cough throughout the year.  Usually 2 or 3 times a year.  Often with upper respiratory illness.  Anytime she gets a cold it settles in her chest and she gets bronchitis per her report.  Often treated with azithromycin.  Occasionally addition of prednisone.  No prednisone the last year or so per her report.  Gradually her symptoms improved.  Albuterol use as needed also helps things.  She notes history with seasonal allergies.  Often times pollens will trigger bronchitis-like symptoms as described above.  Worse in the fall and spring.  Another flare late 08/2021 into early 09/2021.  Associate with COVID infection.  Cough is worse.  This gradually improved and nearly resolved.  She coughs a few times in the room.  She is never needed maintenance inhaler.  Never formally diagnosed with asthma.  Chest imaging chest  x-ray 08/2021 demonstrates clear lungs bilaterally on my interpretation.  PMH: Hypertension, hyperlipidemia, seasonal allergy, OSA, chronic bronchitis Surgical history: Reviewed with patient, denies any Family history: First-degree relatives with history of emphysema, asthma Social history: Never smoker, lives in [place] / Pulmonary Flowsheets:   ACT:      No data to display          MMRC:     No data to display          Epworth:      No data to display          Tests:   FENO:  No results found for: "NITRICOXIDE"  PFT:     No data to display          WALK:      No data to display          Imaging: Personally reviewed and as per EMR discussion this note  Lab Results: Personally reviewed CBC    Component Value Date/Time   WBC 6.4 02/02/2022 1029   RBC 5.09 02/02/2022 1029   HGB 14.7 02/02/2022 1029   HCT 42.9 02/02/2022 1029   PLT 228.0 02/02/2022 1029   MCV 84.3 02/02/2022 1029   MCH 28.7 06/04/2020 1215   MCHC 34.2 02/02/2022 1029   RDW 13.2 02/02/2022 1029   LYMPHSABS 1.7 02/02/2022 1029   MONOABS 0.6 02/02/2022 1029   EOSABS  0.3 02/02/2022 1029   BASOSABS 0.1 02/02/2022 1029    BMET    Component Value Date/Time   NA 140 02/02/2022 1029   K 3.5 02/02/2022 1029   CL 101 02/02/2022 1029   CO2 30 02/02/2022 1029   GLUCOSE 92 02/02/2022 1029   BUN 19 02/02/2022 1029   CREATININE 0.72 02/02/2022 1029   CREATININE 0.74 06/04/2020 1215   CALCIUM 9.5 02/02/2022 1029   GFRNONAA 77 06/04/2020 1215   GFRAA 89 06/04/2020 1215    BNP No results found for: "BNP"  ProBNP    Component Value Date/Time   PROBNP 77.3 01/01/2013 0100    Specialty Problems       Pulmonary Problems   Chronic cough     Allergies  Allergen Reactions   Tetracyclines & Related     Stomach cramps    Immunization History  Administered Date(s) Administered   Fluad Quad(high Dose 65+) 05/28/2021   Influenza, High Dose Seasonal  PF 08/04/2019   PFIZER(Purple Top)SARS-COV-2 Vaccination 10/22/2019, 11/16/2019   Pneumococcal Conjugate-13 07/07/2018   Pneumococcal Polysaccharide-23 06/07/2013   Tetanus 06/07/2013    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   fluticasone (FLONASE) 50 MCG/ACT nasal spray Place 2 sprays into both nostrils daily.   fluticasone furoate-vilanterol (BREO ELLIPTA) 200-25 MCG/ACT AEPB Inhale 1 puff into the lungs daily.   fosinopril (MONOPRIL) 40 MG tablet Take 1 tablet (40 mg total) by mouth daily.   hydrochlorothiazide (HYDRODIURIL) 25 MG tablet TAKE ONE TABLET BY MOUTH DAILY   levothyroxine (SYNTHROID) 88 MCG tablet Take 1 tablet (88 mcg total) by mouth daily before breakfast.   pravastatin (PRAVACHOL) 20 MG tablet Take 1 tablet (20 mg total) by mouth daily.   traZODone (DESYREL) 50 MG tablet TAKE 1/2 TO 1 TABLET AT BEDTIME AS NEEDED FOR SLEEP   vitamin C (ASCORBIC ACID) 500 MG tablet Take 500 mg by mouth daily.   [DISCONTINUED] promethazine-dextromethorphan (PROMETHAZINE-DM) 6.25-15 MG/5ML syrup Take 5 mLs by mouth at bedtime as needed for cough.   No facility-administered encounter medications on file as of 02/25/2022.     Review of Systems  Review of Systems  N/A Physical Exam  BP 124/66 (BP Location:  Left Arm, Patient Position: Sitting, Cuff Size: Normal)   Pulse 68   Temp 98.4 F (36.9 C) (Oral)   Ht '5\' 4"'$  (1.626 m)   Wt 160 lb 3.2 oz (72.7 kg)   LMP  (LMP Unknown)   SpO2 98%   BMI 27.50 kg/m   Wt Readings from Last 5 Encounters:  02/25/22 160 lb 3.2 oz (72.7 kg)  02/02/22 159 lb 12.8 oz (72.5 kg)  10/27/21 162 lb (73.5 kg)  09/04/21 159 lb 6.1 oz (72.3 kg)  05/26/21  159 lb 6.1 oz (72.3 kg)    BMI Readings from Last 5 Encounters:  02/25/22 27.50 kg/m  02/02/22 26.59 kg/m  10/27/21 26.96 kg/m  09/04/21 27.36 kg/m  05/26/21 27.36 kg/m     Physical Exam General: Well-appearing, no acute distress Eyes: EOMI, no icterus Neck: Supple, JVP Pulmonary: Clear, normal work of breathing Cardiovascular: Regular in rhythm, no murmur Neuro: Normal gait, no weakness MSK: No synovitis, no joint effusion Abdomen: Nondistended, bowel sounds present Psych: Normal mood, full affect   Assessment & Plan:   Cough: Onset in the setting of COVID infection.  Often recurs seasonally as well with upper respiratory infections.  High suspicion for cough variant asthma given recurrent episodes of this and bronchitis.  Also likely contributor postnasal drip given chronic rhinorrhea.  Chest imaging clear 08/2021.  Seems improved with high-dose Breo.  Not using the nasal sprays for now.  May need to encourage this in the future if cough recurs given description of chronic rhinorrhea.  Asthma: Based on recurrent bronchitis, cough with seasonal changes and pollen as well as upper respiratory illnesses.  At least once a year, often 2-3.  Treated numerous times with azithromycin, has used prednisone at times although none 2022.  Continue high-dose Breo 1 puff daily.  Instructed her to continue albuterol 2 puffs every 4-6 hours as needed for cough or other asthma symptoms such as chest tightness or shortness of breath.  Has not required albuterol in the last several months.   Return in about 4  months (around 06/27/2022).   Lanier Clam, MD 02/25/2022

## 2022-02-25 NOTE — Patient Instructions (Signed)
Nice to see you again  No changes in medication  Lets assess how things are going in early to mid fall and that we can make a plan from there  Return to clinic in 4 months or sooner as needed with Dr. Silas Flood

## 2022-03-05 ENCOUNTER — Other Ambulatory Visit: Payer: Self-pay | Admitting: Physician Assistant

## 2022-03-05 DIAGNOSIS — Z1231 Encounter for screening mammogram for malignant neoplasm of breast: Secondary | ICD-10-CM

## 2022-03-08 DIAGNOSIS — H353112 Nonexudative age-related macular degeneration, right eye, intermediate dry stage: Secondary | ICD-10-CM | POA: Diagnosis not present

## 2022-03-08 DIAGNOSIS — H353221 Exudative age-related macular degeneration, left eye, with active choroidal neovascularization: Secondary | ICD-10-CM | POA: Diagnosis not present

## 2022-04-09 ENCOUNTER — Other Ambulatory Visit: Payer: Self-pay

## 2022-04-09 MED ORDER — HYDROCHLOROTHIAZIDE 25 MG PO TABS: 25.0000 mg | ORAL_TABLET | Freq: Every day | ORAL | 0 refills | Status: DC

## 2022-04-09 MED ORDER — PRAVASTATIN SODIUM 20 MG PO TABS: 20.0000 mg | ORAL_TABLET | Freq: Every day | ORAL | 0 refills | Status: DC

## 2022-04-09 MED ORDER — AMLODIPINE BESYLATE 5 MG PO TABS: 5.0000 mg | ORAL_TABLET | Freq: Every day | ORAL | 0 refills | Status: DC

## 2022-04-12 ENCOUNTER — Ambulatory Visit
Admission: RE | Admit: 2022-04-12 | Discharge: 2022-04-12 | Disposition: A | Discharge status: Home / Self Care | Payer: Medicare HMO | Source: Ambulatory Visit | Attending: Physician Assistant | Admitting: Physician Assistant

## 2022-04-12 DIAGNOSIS — Z1231 Encounter for screening mammogram for malignant neoplasm of breast: Secondary | ICD-10-CM | POA: Diagnosis not present

## 2022-04-16 ENCOUNTER — Telehealth: Payer: Self-pay

## 2022-04-16 NOTE — Telephone Encounter (Signed)
Patient pharmacy sent request for refill for Allopurinol 100 mg but not on active medication lists. Please advise

## 2022-04-16 NOTE — Telephone Encounter (Signed)
Patient not requesting this medication and has asked Kristopher Oppenheim to remove several times. Contacted pharmacy as well

## 2022-04-19 DIAGNOSIS — H353221 Exudative age-related macular degeneration, left eye, with active choroidal neovascularization: Secondary | ICD-10-CM | POA: Diagnosis not present

## 2022-04-19 DIAGNOSIS — H43813 Vitreous degeneration, bilateral: Secondary | ICD-10-CM | POA: Diagnosis not present

## 2022-04-19 DIAGNOSIS — H35372 Puckering of macula, left eye: Secondary | ICD-10-CM | POA: Diagnosis not present

## 2022-04-19 DIAGNOSIS — H353112 Nonexudative age-related macular degeneration, right eye, intermediate dry stage: Secondary | ICD-10-CM | POA: Diagnosis not present

## 2022-04-24 ENCOUNTER — Other Ambulatory Visit: Payer: Self-pay | Admitting: Physician Assistant

## 2022-05-31 ENCOUNTER — Ambulatory Visit (INDEPENDENT_AMBULATORY_CARE_PROVIDER_SITE_OTHER): Payer: Medicare HMO

## 2022-05-31 VITALS — Wt 158.0 lb

## 2022-05-31 DIAGNOSIS — H353112 Nonexudative age-related macular degeneration, right eye, intermediate dry stage: Secondary | ICD-10-CM | POA: Diagnosis not present

## 2022-05-31 DIAGNOSIS — H43813 Vitreous degeneration, bilateral: Secondary | ICD-10-CM | POA: Diagnosis not present

## 2022-05-31 DIAGNOSIS — H353221 Exudative age-related macular degeneration, left eye, with active choroidal neovascularization: Secondary | ICD-10-CM | POA: Diagnosis not present

## 2022-05-31 DIAGNOSIS — Z0000 Encounter for general adult medical examination without abnormal findings: Secondary | ICD-10-CM | POA: Diagnosis not present

## 2022-05-31 NOTE — Patient Instructions (Signed)
Teresa Rangel , Thank you for taking time to come for your Medicare Wellness Visit. I appreciate your ongoing commitment to your health goals. Please review the following plan we discussed and let me know if I can assist you in the future.   Screening recommendations/referrals: Colonoscopy: no longer required Mammogram: done 04/12/22 repeat every year  Bone Density: done 05/03/19 repeat as directed  Recommended yearly ophthalmology/optometry visit for glaucoma screening and checkup Recommended yearly dental visit for hygiene and checkup  Vaccinations: Influenza vaccine: due  Pneumococcal vaccine: Up to date Tdap vaccine: done 06/07/13 repeat every 10 years  Shingles vaccine: 1st dose 04/12/22   Covid-19:completed 2/8, 11/16/19  Advanced directives: Please bring a copy of your health care power of attorney and living will to the office at your convenience.  Conditions/risks identified: LOSE 8 LBS   Next appointment: Follow up in one year for your annual wellness visit    Preventive Care 65 Years and Older, Female Preventive care refers to lifestyle choices and visits with your health care provider that can promote health and wellness. What does preventive care include? A yearly physical exam. This is also called an annual well check. Dental exams once or twice a year. Routine eye exams. Ask your health care provider how often you should have your eyes checked. Personal lifestyle choices, including: Daily care of your teeth and gums. Regular physical activity. Eating a healthy diet. Avoiding tobacco and drug use. Limiting alcohol use. Practicing safe sex. Taking low-dose aspirin every day. Taking vitamin and mineral supplements as recommended by your health care provider. What happens during an annual well check? The services and screenings done by your health care provider during your annual well check will depend on your age, overall health, lifestyle risk factors, and family history of  disease. Counseling  Your health care provider may ask you questions about your: Alcohol use. Tobacco use. Drug use. Emotional well-being. Home and relationship well-being. Sexual activity. Eating habits. History of falls. Memory and ability to understand (cognition). Work and work Statistician. Reproductive health. Screening  You may have the following tests or measurements: Height, weight, and BMI. Blood pressure. Lipid and cholesterol levels. These may be checked every 5 years, or more frequently if you are over 30 years old. Skin check. Lung cancer screening. You may have this screening every year starting at age 59 if you have a 30-pack-year history of smoking and currently smoke or have quit within the past 15 years. Fecal occult blood test (FOBT) of the stool. You may have this test every year starting at age 19. Flexible sigmoidoscopy or colonoscopy. You may have a sigmoidoscopy every 5 years or a colonoscopy every 10 years starting at age 33. Hepatitis C blood test. Hepatitis B blood test. Sexually transmitted disease (STD) testing. Diabetes screening. This is done by checking your blood sugar (glucose) after you have not eaten for a while (fasting). You may have this done every 1-3 years. Bone density scan. This is done to screen for osteoporosis. You may have this done starting at age 86. Mammogram. This may be done every 1-2 years. Talk to your health care provider about how often you should have regular mammograms. Talk with your health care provider about your test results, treatment options, and if necessary, the need for more tests. Vaccines  Your health care provider may recommend certain vaccines, such as: Influenza vaccine. This is recommended every year. Tetanus, diphtheria, and acellular pertussis (Tdap, Td) vaccine. You may need a Td booster  every 10 years. Zoster vaccine. You may need this after age 32. Pneumococcal 13-valent conjugate (PCV13) vaccine. One  dose is recommended after age 23. Pneumococcal polysaccharide (PPSV23) vaccine. One dose is recommended after age 28. Talk to your health care provider about which screenings and vaccines you need and how often you need them. This information is not intended to replace advice given to you by your health care provider. Make sure you discuss any questions you have with your health care provider. Document Released: 09/26/2015 Document Revised: 05/19/2016 Document Reviewed: 07/01/2015 Elsevier Interactive Patient Education  2017 [place] Prevention in the Home Falls can cause injuries. They can happen to people of all ages. There are many things you can do to make your home safe and to help prevent falls. What can I do on the outside of my home? Regularly fix the edges of walkways and driveways and fix any cracks. Remove anything that might make you trip as you walk through a door, such as a raised step or threshold. Trim any bushes or trees on the path to your home. Use bright outdoor lighting. Clear any walking paths of anything that might make someone trip, such as rocks or tools. Regularly check to see if handrails are loose or broken. Make sure that both sides of any steps have handrails. Any raised decks and porches should have guardrails on the edges. Have any leaves, snow, or ice cleared regularly. Use sand or salt on walking paths during winter. Clean up any spills in your garage right away. This includes oil or grease spills. What can I do in the bathroom? Use night lights. Install grab bars by the toilet and in the tub and shower. Do not use towel bars as grab bars. Use non-skid mats or decals in the tub or shower. If you need to sit down in the shower, use a plastic, non-slip stool. Keep the floor dry. Clean up any water that spills on the floor as soon as it happens. Remove soap buildup in the tub or shower regularly. Attach bath mats securely with double-sided  non-slip rug tape. Do not have throw rugs and other things on the floor that can make you trip. What can I do in the bedroom? Use night lights. Make sure that you have a light by your bed that is easy to reach. Do not use any sheets or blankets that are too big for your bed. They should not hang down onto the floor. Have a firm chair that has side arms. You can use this for support while you get dressed. Do not have throw rugs and other things on the floor that can make you trip. What can I do in the kitchen? Clean up any spills right away. Avoid walking on wet floors. Keep items that you use a lot in easy-to-reach places. If you need to reach something above you, use a strong step stool that has a grab bar. Keep electrical cords out of the way. Do not use floor polish or wax that makes floors slippery. If you must use wax, use non-skid floor wax. Do not have throw rugs and other things on the floor that can make you trip. What can I do with my stairs? Do not leave any items on the stairs. Make sure that there are handrails on both sides of the stairs and use them. Fix handrails that are broken or loose. Make sure that handrails are as long as the stairways. Check any carpeting to  make sure that it is firmly attached to the stairs. Fix any carpet that is loose or worn. Avoid having throw rugs at the top or bottom of the stairs. If you do have throw rugs, attach them to the floor with carpet tape. Make sure that you have a light switch at the top of the stairs and the bottom of the stairs. If you do not have them, ask someone to add them for you. What else can I do to help prevent falls? Wear shoes that: Do not have high heels. Have rubber bottoms. Are comfortable and fit you well. Are closed at the toe. Do not wear sandals. If you use a stepladder: Make sure that it is fully opened. Do not climb a closed stepladder. Make sure that both sides of the stepladder are locked into place. Ask  someone to hold it for you, if possible. Clearly mark and make sure that you can see: Any grab bars or handrails. First and last steps. Where the edge of each step is. Use tools that help you move around (mobility aids) if they are needed. These include: Canes. Walkers. Scooters. Crutches. Turn on the lights when you go into a dark area. Replace any light bulbs as soon as they burn out. Set up your furniture so you have a clear path. Avoid moving your furniture around. If any of your floors are uneven, fix them. If there are any pets around you, be aware of where they are. Review your medicines with your doctor. Some medicines can make you feel dizzy. This can increase your chance of falling. Ask your doctor what other things that you can do to help prevent falls. This information is not intended to replace advice given to you by your health care provider. Make sure you discuss any questions you have with your health care provider. Document Released: 06/26/2009 Document Revised: 02/05/2016 Document Reviewed: 10/04/2014 Elsevier Interactive Patient Education  2017 [employer].

## 2022-05-31 NOTE — Progress Notes (Signed)
Virtual Visit via Telephone Note  I connected with  Teresa Rangel on 05/31/22 at  1:45 PM EDT by telephone and verified that I am speaking with the correct person using two identifiers.  Medicare Annual Wellness visit completed telephonically due to Covid-19 pandemic.   Persons participating in this call: This Health Coach and this patient.   Location: Patient: home Provider: office    I discussed the limitations, risks, security and privacy concerns of performing an evaluation and management service by telephone and the availability of in person appointments. The patient expressed understanding and agreed to proceed.  Unable to perform video visit due to video visit attempted and failed and/or patient does not have video capability.   Some vital signs may be absent or patient reported.   [name], LPN   Subjective:   Teresa Rangel is a 81 y.o. female who presents for Medicare Annual (Subsequent) preventive examination.  Review of Systems     Cardiac Risk Factors include: advanced age (>53mn, >>84women);hypertension;dyslipidemia     Objective:    Today's Vitals   05/31/22 1349  Weight: 158 lb (71.7 kg)   Body mass index is 27.12 kg/m.     05/30/2021   12:04 PM 05/02/2020   10:29 AM 04/30/2019    3:43 PM 07/04/2018   11:00 PM 11/30/2015    1:54 PM  Advanced Directives  Does Patient Have a Medical Advance Directive? Yes Yes No No No  Type of [place] will     Does patient want to make changes to medical advance directive? No - Patient declined      Copy of [place] Chart? No - copy requested No - copy requested     Would patient like information on creating a medical advance directive?   No - Patient declined No - Patient declined No - patient declined information    Current Medications (verified) Outpatient Encounter Medications as of  05/31/2022  Medication Sig   albuterol (VENTOLIN HFA) 108 (90 Base) MCG/ACT inhaler INHALE TWO PUFFS BY MOUTH EVERY 6 HOURS AS NEEDED FOR WHEEZING OR FOR SHORTNESS OF BREATH   amLODipine (NORVASC) 5 MG tablet Take 1 tablet (5 mg total) by mouth daily.   cholecalciferol (VITAMIN D) 1000 UNITS tablet Take 2,000 Units by mouth daily.   fluticasone (FLONASE) 50 MCG/ACT nasal spray Place 2 sprays into both nostrils daily.   fluticasone furoate-vilanterol (BREO ELLIPTA) 200-25 MCG/ACT AEPB Inhale 1 puff into the lungs daily.   hydrochlorothiazide (HYDRODIURIL) 25 MG tablet Take 1 tablet (25 mg total) by mouth daily.   pravastatin (PRAVACHOL) 20 MG tablet Take 1 tablet (20 mg total) by mouth daily.   vitamin C (ASCORBIC ACID) 500 MG tablet Take 500 mg by mouth daily.   fosinopril (MONOPRIL) 40 MG tablet Take 1 tablet (40 mg total) by mouth daily.   levothyroxine (SYNTHROID) 88 MCG tablet Take 1 tablet (88 mcg total) by mouth daily before breakfast.   [DISCONTINUED] traZODone (DESYREL) 50 MG tablet TAKE 1/2 TO 1 TABLET AT BEDTIME AS NEEDED FOR SLEEP   No facility-administered encounter medications on file as of 05/31/2022.    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   Benign    Past Surgical History:  Procedure Laterality Date   [place]   THYROIDECTOMY  2007   TONSILLECTOMY  1949   Family History  Problem Relation Age of Onset   Breast cancer Maternal Aunt 18   Social History   Socioeconomic History   Marital status: Married    Spouse name: Not on file   Number of children: 2   Years of education: Not on file   Highest education level: Not on file  Occupational History   Occupation: Retired  Tobacco Use    Smoking status: Never   Smokeless tobacco: Never  Vaping Use   Vaping Use: Never used  Substance and Sexual Activity   Alcohol use: Not Currently    Alcohol/week: 1.0 standard drink of alcohol    Types: 1 Glasses of wine per week    Comment: occassional   Drug use: No   Sexual activity: Yes    Partners: Male  Other Topics Concern   Not on file  Social History Narrative   Part time Pharmacist, 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   Financial Resource Strain: Low Risk  (05/31/2022)   Overall Financial Resource Strain (CARDIA)    Difficulty of Paying Living Expenses: Not hard at all  Food Insecurity: No Food Insecurity (05/31/2022)   Hunger Vital Sign    Worried About [place] in the Last Year: Never true    [place] in the Last Year: Never true  Transportation Needs: No Transportation Needs (05/31/2022)   PRAPARE - Hydrologist (Medical): No    Lack of Transportation (Non-Medical): No  Physical Activity: Insufficiently Active (05/31/2022)   Exercise Vital Sign    Days of Exercise per Week: 1 day    Minutes of Exercise per Session: 30 min  Stress: No Stress Concern Present (05/31/2022)   [place]    Feeling of Stress : Not at all  Social Connections: [place] (05/31/2022)   Social Connection and Isolation Panel [NHANES]    Frequency of Communication with Friends and Family: More than three times a week    Frequency of Social Gatherings with Friends and Family: More than three times a week    Attends Religious Services: More than 4 times per year    Active Member of [employer] or Organizations: Yes    Attends Archivist Meetings: 1 to 4 times per year    Marital Status: Married    Tobacco Counseling Counseling given: Not Answered   Clinical Intake:  Pre-visit preparation completed: Yes  Pain : No/denies pain     BMI - recorded:  27.12 Nutritional Status: BMI 25 -29 Overweight Nutritional Risks: None Diabetes: No  How often do you need to have someone help you when you read instructions, pamphlets, or other written materials from your doctor or pharmacy?: 1 - Never  Diabetic?no  Interpreter Needed?: No  Information entered by :: [name], LPN   Activities of Daily Living    05/31/2022    1:56 PM  In your present state of health, do you have any difficulty performing the following activities:  Hearing? 0  Vision? 0  Difficulty concentrating or making decisions? 0  Walking or climbing stairs? 0  Dressing or bathing? 0  Doing errands, shopping? 0  Preparing Food and eating ? N  Using the Toilet? N  In the past six months, have you accidently leaked urine? N  Do you have problems with loss of bowel control? N  Managing your Medications? N  Managing your Finances? N  Housekeeping or managing your Housekeeping? N    Patient Care Team: Allward[place], Randa Evens, PA-C as PCP - General (Physician Assistant) Alyson Ingles Candee Furbish, MD as Consulting Physician (Urology) Princess Bruins, MD as Referring Physician (Ophthalmology)  Indicate any recent Medical Services you may have received from other than [facility] providers in the past year (date may be approximate).     Assessment:   This is a routine wellness examination for Eilee.  Hearing/Vision screen Hearing Screening - Comments:: Pt denies any hearing issues  Vision Screening - Comments:: Pt follows up Dr Delman Cheadle for annual eye exams   Dietary issues and exercise activities discussed: Current Exercise Habits: Home exercise routine, Type of exercise: walking, Time (Minutes): 30, Frequency (Times/Week): 1, Weekly Exercise (Minutes/Week): 30   Goals Addressed             This Visit's Progress    Patient Stated       Lose 8 lbs        Depression Screen    05/31/2022    1:54 PM 05/30/2021   11:59 AM 05/26/2021    8:34 AM 02/06/2021    2:40 PM  09/19/2020   11:11 AM 06/04/2020   11:35 AM 05/02/2020   10:27 AM  PHQ 2/9 Scores  PHQ - 2 Score 0 0 0 1 4 0 0  PHQ- 9 Score    5 16      Fall Risk    05/31/2022    1:56 PM 05/30/2021   12:08 PM 05/26/2021    8:34 AM 09/19/2020   10:25 AM 06/04/2020   11:36 AM  [place] in the past year? 0 0 0 0 0  Number falls in past yr: 0 0 0    Injury with Fall? 0 0 0    Risk for fall due to : No Fall Risks;Impaired vision No Fall Risks No Fall Risks No Fall Risks No Fall Risks  Follow up Falls prevention discussed  Education provided      FALL RISK PREVENTION PERTAINING TO THE HOME:  Any stairs in or around the home? Yes  If so, are there any without handrails? No  Home free of loose throw rugs in walkways, pet beds, electrical cords, etc? Yes  Adequate lighting in your home to reduce risk of falls? Yes   ASSISTIVE DEVICES UTILIZED TO PREVENT FALLS:  Life alert? No  Use of a cane, walker or w/c? No  Grab bars in the bathroom? Yes  Shower chair or bench in shower? No  Elevated toilet seat or a handicapped toilet? No   TIMED UP AND GO:  Was the test performed? No .  Cognitive Function:        05/31/2022    1:57 PM 05/30/2021   12:09 PM 05/02/2020   10:41 AM  6CIT Screen  What Year? 0 points 0 points 0 points  What month? 0 points 0 points 0 points  What time? 0 points 0 points   Count back from 20 0 points 0 points 0 points  Months in reverse 0 points 0 points 0 points  Repeat phrase 0 points 0 points 0 points  Total Score 0 points 0 points     Immunizations Immunization  History  Administered Date(s) Administered   Fluad Quad(high Dose 65+) 05/28/2021   Influenza, High Dose Seasonal PF 08/04/2019   PFIZER(Purple Top)SARS-COV-2 Vaccination 10/22/2019, 11/16/2019   Pneumococcal Conjugate-13 07/07/2018   Pneumococcal Polysaccharide-23 06/07/2013   Tetanus 06/07/2013   Zoster Recombinat (Shingrix) 04/12/2022    TDAP status: Up to date  Flu Vaccine status: Due,  Education has been provided regarding the importance of this vaccine. Advised may receive this vaccine at local pharmacy or Health Dept. Aware to provide a copy of the vaccination record if obtained from local pharmacy or Health Dept. Verbalized acceptance and understanding.  Pneumococcal vaccine status: Up to date  Covid-19 vaccine status: Completed vaccines  Qualifies for Shingles Vaccine? Yes   Zostavax completed Yes   Shingrix Completed?: Yes  Screening Tests Health Maintenance  Topic Date Due   COVID-19 Vaccine (3 - Pfizer series) 01/11/2020   INFLUENZA VACCINE  04/13/2022   Zoster Vaccines- Shingrix (2 of 2) 06/07/2022   MAMMOGRAM  04/13/2023   TETANUS/TDAP  06/08/2023   Pneumonia Vaccine 36+ Years old  Completed   DEXA SCAN  Completed   HPV VACCINES  Aged Out    Health Maintenance  Health Maintenance Due  Topic Date Due   COVID-19 Vaccine (3 - Pfizer series) 01/11/2020   INFLUENZA VACCINE  04/13/2022    Colorectal cancer screening: No longer required.   Mammogram status: Completed 04/12/22. Repeat every year  Bone Density status: Completed 05/03/19. Results reflect: Bone density results: OSTEOPOROSIS. Repeat every 2 years.  Additional Screening:  Vision Screening: Recommended annual ophthalmology exams for early detection of glaucoma and other disorders of the eye. Is the patient up to date with their annual eye exam?  Yes  Who is the provider or what is the name of the office in which the patient attends annual eye exams? Dr Delman Cheadle If pt is not established with a provider, would they like to be referred to a provider to establish care? No .   Dental Screening: Recommended annual dental exams for proper oral hygiene  Community Resource Referral / Chronic Care Management: CRR required this visit?  No   CCM required this visit?  No      Plan:     I have personally reviewed and noted the following in the patient's chart:   Medical and social history Use of  alcohol, tobacco or illicit drugs  Current medications and supplements including opioid prescriptions. Patient is not currently taking opioid prescriptions. Functional ability and status Nutritional status Physical activity Advanced directives List of other physicians Hospitalizations, surgeries, and ER visits in previous 12 months Vitals Screenings to include cognitive, depression, and falls Referrals and appointments  In addition, I have reviewed and discussed with patient certain preventive protocols, quality metrics, and best practice recommendations. A written personalized care plan for preventive services as well as general preventive health recommendations were provided to patient.     [name], LPN   [id number]/[id number]   Nurse Notes: none

## 2022-06-07 ENCOUNTER — Other Ambulatory Visit: Payer: Self-pay

## 2022-06-07 ENCOUNTER — Encounter: Payer: Self-pay | Admitting: *Deleted

## 2022-06-07 MED ORDER — LEVOTHYROXINE SODIUM 88 MCG PO TABS
88.0000 ug | ORAL_TABLET | Freq: Every day | ORAL | 3 refills | Status: DC
Start: 2022-06-07 — End: 2023-09-02

## 2022-06-10 ENCOUNTER — Other Ambulatory Visit: Payer: Self-pay | Admitting: Physician Assistant

## 2022-06-14 DIAGNOSIS — N3946 Mixed incontinence: Secondary | ICD-10-CM | POA: Diagnosis not present

## 2022-06-14 DIAGNOSIS — N3021 Other chronic cystitis with hematuria: Secondary | ICD-10-CM | POA: Diagnosis not present

## 2022-06-28 ENCOUNTER — Ambulatory Visit (INDEPENDENT_AMBULATORY_CARE_PROVIDER_SITE_OTHER): Payer: Medicare HMO

## 2022-06-28 ENCOUNTER — Ambulatory Visit: Payer: Medicare HMO | Admitting: Podiatry

## 2022-06-28 ENCOUNTER — Encounter: Payer: Self-pay | Admitting: Podiatry

## 2022-06-28 DIAGNOSIS — M778 Other enthesopathies, not elsewhere classified: Secondary | ICD-10-CM

## 2022-06-28 NOTE — Progress Notes (Signed)
She presents today after having not seen her for a couple of years with a chief complaint of injury to the dorsal aspect of her foot where she dropped a laptop on the top of it.  She states that ever since that time the top of my foot has been in between my big toe and my second toe.  She is also complaining of pain to the midfoot similar area on the right side.  Thinks that both of these are probably arthritis induced but does have some nerve side effect associated with it.  Objective: Vital signs stable alert and oriented x3.  Pulses are palpable.  She has tenderness on palpation of the left foot at the tarsometatarsal joints particularly the first and second with numbness along the tract of the deep peroneal nerve to the level of the toes.  She has some tenderness on frontal plane range of motion but otherwise not very much pain at all there.  Right foot does demonstrate some more tenderness in the midfoot with numbness from superior to her right ankle across the dorsal aspect of the foot to the tips of the toes.  Radiographs taken today demonstrate osseously mature foot significant osteoarthritic changes midfoot right minimally so midfoot left.  No acute findings.  Internal fixation to the first metatarsal second metatarsal head and the first metatarsal base is intact with fusion intact.  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  Plan: We did discuss oral therapy injection therapy and topical therapy I wrote down several different topical anti-inflammatories and she is going to give those a try.  I will follow-up with her on an as-needed basis.

## 2022-07-01 ENCOUNTER — Ambulatory Visit: Payer: Medicare HMO

## 2022-07-01 ENCOUNTER — Ambulatory Visit (INDEPENDENT_AMBULATORY_CARE_PROVIDER_SITE_OTHER): Payer: Medicare HMO

## 2022-07-01 DIAGNOSIS — Z23 Encounter for immunization: Secondary | ICD-10-CM | POA: Diagnosis not present

## 2022-07-04 ENCOUNTER — Other Ambulatory Visit: Payer: Self-pay | Admitting: Physician Assistant

## 2022-07-12 DIAGNOSIS — H353221 Exudative age-related macular degeneration, left eye, with active choroidal neovascularization: Secondary | ICD-10-CM | POA: Diagnosis not present

## 2022-07-12 DIAGNOSIS — H35372 Puckering of macula, left eye: Secondary | ICD-10-CM | POA: Diagnosis not present

## 2022-07-12 DIAGNOSIS — H43813 Vitreous degeneration, bilateral: Secondary | ICD-10-CM | POA: Diagnosis not present

## 2022-07-12 DIAGNOSIS — H353112 Nonexudative age-related macular degeneration, right eye, intermediate dry stage: Secondary | ICD-10-CM | POA: Diagnosis not present

## 2022-08-09 ENCOUNTER — Encounter: Payer: Self-pay | Admitting: Physician Assistant

## 2022-08-12 ENCOUNTER — Encounter: Payer: Self-pay | Admitting: Physician Assistant

## 2022-08-16 ENCOUNTER — Encounter: Payer: Self-pay | Admitting: Physician Assistant

## 2022-08-17 ENCOUNTER — Ambulatory Visit (INDEPENDENT_AMBULATORY_CARE_PROVIDER_SITE_OTHER): Payer: Medicare HMO | Admitting: Physician Assistant

## 2022-08-17 ENCOUNTER — Encounter: Payer: Self-pay | Admitting: Physician Assistant

## 2022-08-17 VITALS — BP 132/71 | HR 81 | Temp 98.4°F | Ht 64.0 in | Wt 161.1 lb

## 2022-08-17 DIAGNOSIS — I10 Essential (primary) hypertension: Secondary | ICD-10-CM | POA: Diagnosis not present

## 2022-08-17 DIAGNOSIS — G2581 Restless legs syndrome: Secondary | ICD-10-CM | POA: Diagnosis not present

## 2022-08-17 DIAGNOSIS — R42 Dizziness and giddiness: Secondary | ICD-10-CM

## 2022-08-17 NOTE — Progress Notes (Signed)
Subjective:    Patient ID: Teresa Rangel, female    DOB: [birth year], 81 y.o.   MRN: [id number]  Chief Complaint  Patient presents with   Dizziness    Pt c/o Dizziness, bilateral leg cramps and High BP at home for about a week. Has been using magnesium which does help leg cramps. Dizzy more than usual, Lasted for about a couple of secs.     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 Says she felt like she might have been dehydrated.  She had to cancel the next 2 piano appointments, but then she was able to resume to appointments before the day ended.  "Something just isn't right." Says she's not sure if she's stressed and if that's all related or not.   This weekend reported that she started having a headache like she does when her blood pressure is elevated in the front of her head.  Her blood pressure was 170/100 at 6:30 PM.  She ended up taking 1/2 tablet of her lisinopril 40 mg to help lower her blood pressure.  By 9 PM her blood pressure was 163/188.  The next day her blood pressure reading was 167/90 1 in the morning.  This has been the trend yesterday and again this morning.  Her blood pressure was 180/88 this morning and then 158/77 just prior to coming to appointment.  Started noticing last week having left arm tingling, mornings, goes away during the day; not sure if it's sleep related or not. Husband concerned it is her heart. Chiropractor helps some.   Today -  Just feels "draggy" / lethargic. Slept well last night.  No chest pain. Sometimes feels a twitch in the center of chest. No SOB. No recent sickness. No headache, but head feels somewhat heavy. No weakness in arms or legs.   Also c/o restless legs - cramping in legs, taking magnesium and it helps some  Past Medical History:  Diagnosis Date   History of stomach ulcers 1980   Hyperlipidemia    Hypertension     Hypothyroidism    Macular degeneration of left eye    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   Benign     Past Surgical History:  Procedure Laterality Date   [place]   THYROIDECTOMY  2007   TONSILLECTOMY  1949    Family History  Problem Relation Age of Onset   Breast cancer Maternal Aunt 14    Social History   Tobacco Use   Smoking status: Never   Smokeless tobacco: Never  Vaping Use   Vaping Use: Never used  Substance Use Topics   Alcohol use: Not Currently    Alcohol/week: 1.0 standard drink of alcohol    Types: 1 Glasses of wine per week    Comment: occassional   Drug use: No     Allergies  Allergen Reactions   Molds & Smuts     Other reaction(s): cough   Tetracyclines & Related     Stomach cramps    Review of Systems  Neurological:  Positive for dizziness.   NEGATIVE UNLESS OTHERWISE INDICATED IN HPI      Objective:     BP 132/71 (BP Location: Left Arm, Patient Position: Sitting, Cuff Size: Large)   Pulse 81   Temp 98.4 F (36.9 C) (Temporal)  Ht '5\' 4"'$  (1.626 m)   Wt 161 lb 2 oz (73.1 kg)   LMP  (LMP Unknown)   SpO2 97%   BMI 27.66 kg/m   Wt Readings from Last 3 Encounters:  08/17/22 161 lb 2 oz (73.1 kg)  05/31/22 158 lb (71.7 kg)  02/25/22 160 lb 3.2 oz (72.7 kg)    BP Readings from Last 3 Encounters:  08/17/22 132/71  02/25/22 124/66  02/02/22 127/76     Physical Exam Vitals and nursing note reviewed.  Constitutional:      Appearance: Normal appearance. She is normal weight. She is not toxic-appearing.  HENT:     Head: Normocephalic and atraumatic.  Eyes:     Extraocular Movements: Extraocular movements intact.     Conjunctiva/sclera: Conjunctivae normal.     Pupils: Pupils are equal, round, and reactive to light.  Cardiovascular:     Rate and Rhythm: Normal rate and regular rhythm.     Pulses: Normal pulses.     Heart sounds: Normal heart sounds.  Pulmonary:     Effort: Pulmonary  effort is normal.     Breath sounds: Normal breath sounds.  Abdominal:     General: Abdomen is flat. Bowel sounds are normal.     Palpations: Abdomen is soft.  Musculoskeletal:        General: Normal range of motion.     Cervical back: Normal range of motion and neck supple.  Skin:    General: Skin is warm and dry.  Neurological:     General: No focal deficit present.     Mental Status: She is alert and oriented to person, place, and time.     Cranial Nerves: No cranial nerve deficit.     Motor: No weakness, tremor or pronator drift.     Coordination: Romberg sign negative. Coordination normal. Finger-Nose-Finger Test normal.     Gait: Gait normal.  Psychiatric:        Mood and Affect: Mood normal.        Behavior: Behavior normal.        Thought Content: Thought content normal.        Judgment: Judgment normal.        Assessment & Plan:  Dizziness -     EKG 12-Lead -     CBC with Differential/Platelet -     Comprehensive metabolic panel -     TSH -     Iron, TIBC and Ferritin Panel -     Magnesium -     Vitamin B12  Essential hypertension -     EKG 12-Lead -     CBC with Differential/Platelet -     Comprehensive metabolic panel  Restless leg syndrome -     CBC with Differential/Platelet -     Comprehensive metabolic panel -     TSH -     Iron, TIBC and Ferritin Panel -     Magnesium -     Vitamin B12    Overall relatively healthy 81 year old Caucasian female with history of hypertension, hypothyroidism, presenting for dizziness, elevated blood pressure readings, restless leg syndrome.  Vital signs reassuring today.  She is nontoxic appearing on exam.  No focal neurologic deficits.  She is not orthostatic.  Plan to check labs today and treat pending any abnormalities there.  EKG was also performed in office today.  My personal reading showed normal sinus rhythm at 70 bpm.  No ST or T wave changes.  No changes prior to  comparison from 2019.  Also discussed with  patient possibilities of checking an echocardiogram, carotid ultrasound; consider referral to cardiology.  Did not feel that head imaging was indicated today, but discussed red flags with her and changes that would necessitate that imaging.  Patient agreeable and understanding of plan.  She is going to go home and hydrate well and rest.  Needs to work on ways to [employer].  Strict ER precautions discussed.  Patient agreeable and understanding.    Return if symptoms worsen or fail to improve.  This note was prepared with assistance of Systems analyst. Occasional wrong-word or sound-a-like substitutions may have occurred due to the inherent limitations of voice recognition software.  In addition to time spent for ekg, I spent 50 minutes of total time on the date of the encounter performing the following actions: chart review prior to seeing the patient, obtaining history, performing a medically necessary exam, counseling on the treatment plan, placing orders, and documenting in our EHR.       Victoria Henshaw M Coy [place], PA-C

## 2022-08-17 NOTE — Patient Instructions (Addendum)
Ok to take Aspirin 81 mg daily Push fluids, stay well-hydrated, try Propel water once or twice daily Labs today, will treat if needed pending results  If any acute sudden severe chest pain, shortness of breath, severe headache, fainting, BP readings 190s/100s, go straight to ED.  Pending normal labs, consider cardiology workup / brain imaging.

## 2022-08-18 LAB — COMPREHENSIVE METABOLIC PANEL
ALT: 15 U/L (ref 0–35)
AST: 18 U/L (ref 0–37)
Albumin: 4.2 g/dL (ref 3.5–5.2)
Alkaline Phosphatase: 61 U/L (ref 39–117)
BUN: 20 mg/dL (ref 6–23)
CO2: 29 mEq/L (ref 19–32)
Calcium: 9 mg/dL (ref 8.4–10.5)
Chloride: 103 mEq/L (ref 96–112)
Creatinine, Ser: 0.79 mg/dL (ref 0.40–1.20)
GFR: 69.57 mL/min (ref >60.00)
Glucose, Bld: 94 mg/dL (ref 70–99)
Potassium: 3.6 mEq/L (ref 3.5–5.1)
Sodium: 142 mEq/L (ref 135–145)
Total Bilirubin: 0.5 mg/dL (ref 0.2–1.2)
Total Protein: 6.4 g/dL (ref 6.0–8.3)

## 2022-08-18 LAB — CBC WITH DIFFERENTIAL/PLATELET
Basophils Absolute: 0 10*3/uL (ref 0.0–0.1)
Basophils Relative: 0.6 % (ref 0.0–3.0)
Eosinophils Absolute: 0.3 10*3/uL (ref 0.0–0.7)
Eosinophils Relative: 4.3 % (ref 0.0–5.0)
HCT: 41.9 % (ref 36.0–46.0)
Hemoglobin: 14.6 g/dL (ref 12.0–15.0)
Lymphocytes Relative: 25.4 % (ref 12.0–46.0)
Lymphs Abs: 1.7 10*3/uL (ref 0.7–4.0)
MCHC: 34.8 g/dL (ref 30.0–36.0)
MCV: 83.9 fl (ref 78.0–100.0)
Monocytes Absolute: 0.6 10*3/uL (ref 0.1–1.0)
Monocytes Relative: 9.1 % (ref 3.0–12.0)
Neutro Abs: 4 10*3/uL (ref 1.4–7.7)
Neutrophils Relative %: 60.6 % (ref 43.0–77.0)
Platelets: 235 10*3/uL (ref 150.0–400.0)
RBC: 5 Mil/uL (ref 3.87–5.11)
RDW: 13.3 % (ref 11.5–15.5)
WBC: 6.5 10*3/uL (ref 4.0–10.5)

## 2022-08-18 LAB — VITAMIN B12: Vitamin B-12: 103 pg/mL — ABNORMAL LOW (ref 211–911)

## 2022-08-18 LAB — TSH: TSH: 0.36 u[IU]/mL (ref 0.35–5.50)

## 2022-08-18 LAB — MAGNESIUM: Magnesium: 2.1 mg/dL (ref 1.5–2.5)

## 2022-08-19 ENCOUNTER — Encounter: Payer: Self-pay | Admitting: Physician Assistant

## 2022-08-23 DIAGNOSIS — H353221 Exudative age-related macular degeneration, left eye, with active choroidal neovascularization: Secondary | ICD-10-CM | POA: Diagnosis not present

## 2022-08-24 ENCOUNTER — Other Ambulatory Visit: Payer: Self-pay

## 2022-08-24 ENCOUNTER — Encounter: Payer: Self-pay | Admitting: Physician Assistant

## 2022-08-24 ENCOUNTER — Encounter: Payer: Self-pay | Admitting: Neurology

## 2022-08-24 DIAGNOSIS — R42 Dizziness and giddiness: Secondary | ICD-10-CM

## 2022-08-24 DIAGNOSIS — R202 Paresthesia of skin: Secondary | ICD-10-CM

## 2022-08-24 NOTE — Telephone Encounter (Signed)
Please advise how you want pt to proceed? Urgent referral to Cardiologist? ED?

## 2022-08-24 NOTE — Telephone Encounter (Signed)
Called pt and scheduled appt for 11:30 slot on 08/25/22, lvm and pt will return call if unable to keep appt. ASAP referrals being placed for Neuro and Cardio per PCP recommendations.

## 2022-08-25 ENCOUNTER — Ambulatory Visit (INDEPENDENT_AMBULATORY_CARE_PROVIDER_SITE_OTHER): Payer: Medicare HMO | Admitting: Physician Assistant

## 2022-08-25 ENCOUNTER — Encounter: Payer: Self-pay | Admitting: Physician Assistant

## 2022-08-25 VITALS — BP 136/78 | HR 64 | Temp 97.7°F | Ht 64.0 in

## 2022-08-25 DIAGNOSIS — I10 Essential (primary) hypertension: Secondary | ICD-10-CM | POA: Diagnosis not present

## 2022-08-25 DIAGNOSIS — F32A Depression, unspecified: Secondary | ICD-10-CM

## 2022-08-25 DIAGNOSIS — F419 Anxiety disorder, unspecified: Secondary | ICD-10-CM

## 2022-08-25 DIAGNOSIS — R69 Illness, unspecified: Secondary | ICD-10-CM | POA: Diagnosis not present

## 2022-08-25 DIAGNOSIS — R42 Dizziness and giddiness: Secondary | ICD-10-CM | POA: Diagnosis not present

## 2022-08-25 MED ORDER — FLUOXETINE HCL 20 MG PO CAPS: 20.0000 mg | ORAL_CAPSULE | Freq: Every morning | ORAL | 2 refills | Status: DC

## 2022-08-25 NOTE — Progress Notes (Signed)
Subjective:    Patient ID: Teresa Rangel, female    DOB: [birth year], 81 y.o.   MRN: [id number]  Chief Complaint  Patient presents with   Follow-up    Pt in for f/u with high BP, dizziness in the mornings and left arm tingling; thinking this is due to added stress with husband and his upcoming appointments and epidural.     HPI Patient is in today for recheck symptoms from 08/17/22.  BP this morning was 160/85, which is down from where it has been in the mornings. She took 1/2 tab amlodipine 5 mg last night with fosinopril 40 mg and thinks this helped.  She states she is very stressed. She is very worried about her husband's health. He is not able to do the things he normally does because of his health. 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   Denies any HA, CP, SOB, dizziness today. Feels "tingling" all over while talking about her stress.  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   Benign     Past Surgical History:  Procedure Laterality Date   [place]   THYROIDECTOMY  2007   TONSILLECTOMY  1949    Family History  Problem Relation Age of Onset   Breast cancer Maternal Aunt 69    Social History   Tobacco Use   Smoking status: Never   Smokeless tobacco: Never  Vaping Use   Vaping Use: Never used  Substance Use Topics   Alcohol use: Not Currently    Alcohol/week: 1.0 standard drink of alcohol    Types: 1 Glasses of wine per week    Comment: occassional   Drug use: No     Allergies  Allergen Reactions   Molds & Smuts     Other reaction(s): cough   Tetracyclines & Related     Stomach cramps    Review of Systems NEGATIVE UNLESS OTHERWISE INDICATED IN HPI      Objective:     BP 136/78 (BP Location: Left Arm)    Pulse 64   Temp 97.7 F (36.5 C) (Temporal)   Ht '5\' 4"'$  (1.626 m)   LMP  (LMP Unknown)   SpO2 100%   BMI 27.66 kg/m   Wt Readings from Last 3 Encounters:  08/17/22 161 lb 2 oz (73.1 kg)  05/31/22 158 lb (71.7 kg)  02/25/22 160 lb 3.2 oz (72.7 kg)    BP Readings from Last 3 Encounters:  08/25/22 136/78  08/17/22 132/71  02/25/22 124/66     Physical Exam Vitals and nursing note reviewed.  Constitutional:      Appearance: Normal appearance. She is normal weight. She is not toxic-appearing.  HENT:     Head: Normocephalic and atraumatic.  Eyes:     Extraocular Movements: Extraocular movements intact.     Conjunctiva/sclera: Conjunctivae normal.     Pupils: Pupils are equal, round, and reactive to light.  Cardiovascular:     Rate and Rhythm: Normal rate and regular rhythm.     Pulses: Normal pulses.     Heart sounds: Normal heart sounds.  Pulmonary:     Effort: Pulmonary effort is normal.     Breath  sounds: Normal breath sounds.  Abdominal:     General: Abdomen is flat. Bowel sounds are normal.     Palpations: Abdomen is soft.  Musculoskeletal:        General: Normal range of motion.     Cervical back: Normal range of motion and neck supple.  Skin:    General: Skin is warm and dry.  Neurological:     General: No focal deficit present.     Mental Status: She is alert and oriented to person, place, and time.     Cranial Nerves: No cranial nerve deficit.     Motor: No weakness, tremor or pronator drift.     Coordination: Romberg sign negative. Coordination normal. Finger-Nose-Finger Test normal.     Gait: Gait normal.  Psychiatric:        Mood and Affect: Mood is anxious. Affect is tearful.        Behavior: Behavior normal.        Thought Content: Thought content normal.        Judgment: Judgment normal.        Assessment & Plan:  Anxiety and depression  Essential hypertension  Dizziness  Other orders -     FLUoxetine HCl; Take 1 capsule (20 mg total) by  mouth every morning.  Dispense: 30 capsule; Refill: 2   Further discussion with patient today reveals likely significant stress and anxiety as etiology of recent symptoms. Her BP is normotensive. She will continue on amlodipine 5 mg once in the morning, amlodipine 5 mg one half tab in the evening with fosinopril 40 mg and hydrochlorothiazide 25 mg.  Talked about different options for stress relief.  She is going to focus on prayer and meditation and relaxation techniques.  She has done well with Prozac in the past and she is agreeable to start back on Prozac 20 mg at this time. Pt aware of risks vs benefits and possible adverse reactions.  She will continue to monitor symptoms and blood pressure at home.  She knows about red flags when to present to the emergency department.  I will have close follow-up with her in the next few weeks.  She knows to reach out sooner if any questions or concerns.     Return in about 2 weeks (around 09/08/2022) for recheck .  This note was prepared with assistance of Systems analyst. Occasional wrong-word or sound-a-like substitutions may have occurred due to the inherent limitations of voice recognition software.    Chrissa Meetze M Tish Begin, PA-C

## 2022-08-28 ENCOUNTER — Encounter (HOSPITAL_BASED_OUTPATIENT_CLINIC_OR_DEPARTMENT_OTHER): Payer: Self-pay

## 2022-08-28 ENCOUNTER — Other Ambulatory Visit: Payer: Self-pay

## 2022-08-28 DIAGNOSIS — Z79899 Other long term (current) drug therapy: Secondary | ICD-10-CM | POA: Insufficient documentation

## 2022-08-28 DIAGNOSIS — I10 Essential (primary) hypertension: Secondary | ICD-10-CM | POA: Diagnosis not present

## 2022-08-28 DIAGNOSIS — R5381 Other malaise: Secondary | ICD-10-CM | POA: Diagnosis not present

## 2022-08-28 DIAGNOSIS — R42 Dizziness and giddiness: Secondary | ICD-10-CM | POA: Diagnosis not present

## 2022-08-28 LAB — BASIC METABOLIC PANEL
Anion gap: 12 (ref 5–15)
BUN: 15 mg/dL (ref 8–23)
CO2: 24 mmol/L (ref 22–32)
Calcium: 9.1 mg/dL (ref 8.9–10.3)
Chloride: 101 mmol/L (ref 98–111)
Creatinine, Ser: 0.76 mg/dL (ref 0.44–1.00)
GFR, Estimated: >60 mL/min (ref >60)
Glucose, Bld: 113 mg/dL — ABNORMAL HIGH (ref 70–99)
Potassium: 3.3 mmol/L — ABNORMAL LOW (ref 3.5–5.1)
Sodium: 137 mmol/L (ref 135–145)

## 2022-08-28 LAB — CBC
HCT: 42.7 % (ref 36.0–46.0)
Hemoglobin: 14.9 g/dL (ref 12.0–15.0)
MCH: 28.7 pg (ref 26.0–34.0)
MCHC: 34.9 g/dL (ref 30.0–36.0)
MCV: 82.1 fL (ref 80.0–100.0)
Platelets: 240 10*3/uL (ref 150–400)
RBC: 5.2 MIL/uL — ABNORMAL HIGH (ref 3.87–5.11)
RDW: 13 % (ref 11.5–15.5)
WBC: 8.2 10*3/uL (ref 4.0–10.5)
nRBC: 0 % (ref 0.0–0.2)

## 2022-08-28 LAB — TROPONIN I (HIGH SENSITIVITY): Troponin I (High Sensitivity): 9 ng/L (ref <18)

## 2022-08-28 NOTE — ED Triage Notes (Signed)
Patient here POV from Home.  Endorses HTN for 2-3 Days. Seen by PCP on Thursday and UC earlier today and had Lab Studies completed as well as other studies with Unremarkable Results.   History of treated HTN. 205/105 shortly PTA.   NAD Noted during Triage. A&Ox4. GCS 15. Ambulatory.

## 2022-08-29 ENCOUNTER — Emergency Department (HOSPITAL_BASED_OUTPATIENT_CLINIC_OR_DEPARTMENT_OTHER): Payer: Medicare HMO

## 2022-08-29 ENCOUNTER — Emergency Department (HOSPITAL_BASED_OUTPATIENT_CLINIC_OR_DEPARTMENT_OTHER)
Admission: EM | Admit: 2022-08-29 | Discharge: 2022-08-29 | Disposition: A | Discharge status: Home / Self Care | Payer: Medicare HMO | Attending: Emergency Medicine | Admitting: Emergency Medicine

## 2022-08-29 DIAGNOSIS — I10 Essential (primary) hypertension: Secondary | ICD-10-CM | POA: Diagnosis not present

## 2022-08-29 DIAGNOSIS — R079 Chest pain, unspecified: Secondary | ICD-10-CM | POA: Diagnosis not present

## 2022-08-29 NOTE — ED Notes (Signed)
Dc instructions reviewed with patient. Patient voiced understanding. Dc with belongings.  °

## 2022-08-29 NOTE — ED Notes (Signed)
Right ear drum feels like its pulsing also started yesterday.

## 2022-08-29 NOTE — Discharge Instructions (Signed)
Take your blood pressure medications as directed.  Follow-up with your primary care doctor next week for recheck.  Return to emergency room if you have any worsening symptoms.

## 2022-08-29 NOTE — ED Provider Notes (Signed)
[facility] EMERGENCY DEPT Provider Note   CSN: [id number] Arrival date & time: 08/28/22  2218     History  Chief Complaint  Patient presents with   Hypertension    Teresa Rangel is a 81 y.o. female.  Patient is a 81 year old female who presents with elevated blood pressure.  She states that she has had trouble over the last few weeks with her blood pressure going up and down.  She recently saw her primary care doctor last week and it was felt that this potentially may be related to anxiety.  She does feel like she is very anxious.  She is anxious about her husband who has had some ongoing back issues and has an upcoming epidural injection scheduled.  She was started on Prozac and she is taken it for 3 days so far but has not had a chance to take effect.  She does feel very anxious.  She does not have any chest pain or shortness of breath.  No palpitations.  She has had some episodes  over the last few weeks of dizziness.  She says that it last about 10 seconds and she feels lightheaded like she might pass out.  No spinning sensation.  No difficulty with her balance.  She says she has been eating and drinking okay.  No unilateral numbness or weakness to her extremities.  No speech deficits or vision changes.       Home Medications Prior to Admission medications   Medication Sig Start Date End Date Taking? Authorizing Provider  amLODipine (NORVASC) 5 MG tablet TAKE 1 TABLET BY MOUTH DAILY 07/05/22  Yes Allwardt, Alyssa M, PA-C  fosinopril (MONOPRIL) 40 MG tablet TAKE ONE TABLET BY MOUTH DAILY 06/10/22  Yes Allwardt, Alyssa M, PA-C  hydrochlorothiazide (HYDRODIURIL) 25 MG tablet TAKE 1 TABLET BY MOUTH DAILY 07/05/22  Yes Allwardt, Alyssa M, PA-C  albuterol (VENTOLIN HFA) 108 (90 Base) MCG/ACT inhaler INHALE TWO PUFFS BY MOUTH EVERY 6 HOURS AS NEEDED FOR WHEEZING OR FOR SHORTNESS OF BREATH 07/06/21   Allwardt, Alyssa M, PA-C  cholecalciferol (VITAMIN D) 1000 UNITS  tablet Take 2,000 Units by mouth daily.    [provider]  FLUoxetine (PROZAC) 20 MG capsule Take 1 capsule (20 mg total) by mouth every morning. 08/25/22   Allwardt, Alyssa M, PA-C  fluticasone (FLONASE) 50 MCG/ACT nasal spray Place 2 sprays into both nostrils daily. 10/07/21   Allwardt, Alyssa M, PA-C  fluticasone furoate-vilanterol (BREO ELLIPTA) 200-25 MCG/ACT AEPB Inhale 1 puff into the lungs daily. 10/27/21   Hunsucker, Bonna Gains, MD  levothyroxine (SYNTHROID) 88 MCG tablet Take 1 tablet (88 mcg total) by mouth daily before breakfast. 06/07/22 06/02/23  Allwardt, Yetta Flock M, PA-C  pravastatin (PRAVACHOL) 20 MG tablet TAKE 1 TABLET BY MOUTH DAILY 07/05/22   Allwardt, Randa Evens, PA-C  SHINGRIX injection  04/12/22   [provider]  vitamin C (ASCORBIC ACID) 500 MG tablet Take 500 mg by mouth daily.    [provider]      Allergies    Molds & smuts and Tetracyclines & related    Review of Systems   Review of Systems  Constitutional:  Negative for chills, diaphoresis, fatigue and fever.  HENT:  Negative for congestion, rhinorrhea and sneezing.   Eyes: Negative.   Respiratory:  Negative for cough, chest tightness and shortness of breath.   Cardiovascular:  Negative for chest pain and leg swelling.  Gastrointestinal:  Negative for abdominal pain, blood in stool, diarrhea, nausea  and vomiting.  Genitourinary:  Negative for difficulty urinating, flank pain, frequency and hematuria.  Musculoskeletal:  Negative for arthralgias and back pain.  Skin:  Negative for rash.  Neurological:  Positive for dizziness and light-headedness. Negative for speech difficulty, weakness, numbness and headaches.  Psychiatric/Behavioral:  The patient is nervous/anxious.     Physical Exam Updated Vital Signs BP (!) 185/68   Pulse 65   Temp 98 F (36.7 C)   Resp (!) 9   Ht '5\' 4"'$  (1.626 m)   Wt 73.1 kg   LMP  (LMP Unknown)   SpO2 99%   BMI 27.66 kg/m  Physical Exam Constitutional:       Appearance: She is well-developed.  HENT:     Head: Normocephalic and atraumatic.  Eyes:     Pupils: Pupils are equal, round, and reactive to light.  Cardiovascular:     Rate and Rhythm: Normal rate and regular rhythm.     Heart sounds: Normal heart sounds.  Pulmonary:     Effort: Pulmonary effort is normal. No respiratory distress.     Breath sounds: Normal breath sounds. No wheezing or rales.  Chest:     Chest wall: No tenderness.  Abdominal:     General: Bowel sounds are normal.     Palpations: Abdomen is soft.     Tenderness: There is no abdominal tenderness. There is no guarding or rebound.  Musculoskeletal:        General: Normal range of motion.     Cervical back: Normal range of motion and neck supple.  Lymphadenopathy:     Cervical: No cervical adenopathy.  Skin:    General: Skin is warm and dry.     Findings: No rash.  Neurological:     Mental Status: She is alert and oriented to person, place, and time.     Comments: Motor 5/5 all extremities Sensation grossly intact to LT all extremities Finger to Nose intact, no pronator drift CN II-XII grossly intact Gait normal      ED Results / Procedures / Treatments   Labs (all labs ordered are listed, but only abnormal results are displayed) Labs Reviewed  BASIC METABOLIC PANEL - Abnormal; Notable for the following components:      Result Value   Potassium 3.3 (*)    Glucose, Bld 113 (*)    All other components within normal limits  CBC - Abnormal; Notable for the following components:   RBC 5.20 (*)    All other components within normal limits  TROPONIN I (HIGH SENSITIVITY)    EKG EKG Interpretation  Date/Time:  Saturday August 28 2022 22:47:21 EST Ventricular Rate:  77 PR Interval:  156 QRS Duration: 72 QT Interval:  392 QTC Calculation: 443 R Axis:   11 Text Interpretation: Normal sinus rhythm Anterior infarct (cited on or before 04-Jul-2018) Abnormal ECG When compared with ECG of 04-Jul-2018  22:59, ST no longer elevated in Inferior leads since last tracing no significant change Confirmed by Malvin Johns [phone]) on 08/29/2022 7:32:44 AM  Radiology DG Chest Port 1 View  Result Date: 08/29/2022 CLINICAL DATA:  Chest pain, hypertension EXAM: PORTABLE CHEST 1 VIEW COMPARISON:  08/25/2021 FINDINGS: Lungs are clear.  No pleural effusion or pneumothorax. The heart is normal in size. IMPRESSION: No evidence of acute cardiopulmonary disease. Electronically Signed   By: Julian Hy M.D.   On: 08/29/2022 00:38    Procedures Procedures    Medications Ordered in ED Medications - No data to display  ED Course/ Medical Decision Making/ A&P                           Medical Decision Making Amount and/or Complexity of Data Reviewed Labs: ordered.   Patient is a 81 year old female who presents with elevated blood pressure.  Her blood pressure was over 200 reportedly at home.  It has decreased during her stay in the ED.  It has had a nonconcerning level.  She does not have any clinical suggestions of a stroke.  No suggestions of ACS.  No ischemic changes on her EKG.  No arrhythmias noted.  She had a chest x-ray which was interpreted by me and confirmed by the radiologist to show no acute abnormalities.  No pulmonary edema.  No evidence of pneumonia.  Her labs are nonconcerning.  She had a negative troponin.  She is currently asymptomatic.  She was discharged home in good condition.  She was encouraged to have follow-up with her primary care doctor.  She was encouraged to take her blood pressure medication regularly.  Return precautions were given.  Final Clinical Impression(s) / ED Diagnoses Final diagnoses:  Uncontrolled hypertension    Rx / DC Orders ED Discharge Orders     None         Malvin Johns, MD 08/29/22 [phone]

## 2022-08-29 NOTE — ED Notes (Signed)
Pt reports yesterday am she had period of feeling nervous, anxious,headache. Went to [employer], everything checked out. Went home and rested, passed on  its own. Getting ready for bed last night felt dizzy and anxious bp 190-205.

## 2022-08-30 ENCOUNTER — Encounter: Payer: Self-pay | Admitting: Physician Assistant

## 2022-08-30 NOTE — Telephone Encounter (Signed)
Please see pt msg and advise 

## 2022-08-31 ENCOUNTER — Encounter: Payer: Self-pay | Admitting: Physician Assistant

## 2022-09-01 ENCOUNTER — Ambulatory Visit (INDEPENDENT_AMBULATORY_CARE_PROVIDER_SITE_OTHER): Payer: Medicare HMO | Admitting: Physician Assistant

## 2022-09-01 ENCOUNTER — Encounter: Payer: Self-pay | Admitting: Physician Assistant

## 2022-09-01 ENCOUNTER — Ambulatory Visit (HOSPITAL_COMMUNITY)
Admission: RE | Admit: 2022-09-01 | Discharge: 2022-09-01 | Disposition: A | Discharge status: Home / Self Care | Payer: Medicare HMO | Source: Ambulatory Visit | Attending: Physician Assistant | Admitting: Physician Assistant

## 2022-09-01 VITALS — BP 150/68 | HR 67 | Temp 97.7°F | Ht 64.0 in

## 2022-09-01 DIAGNOSIS — R519 Headache, unspecified: Secondary | ICD-10-CM

## 2022-09-01 DIAGNOSIS — I10 Essential (primary) hypertension: Secondary | ICD-10-CM | POA: Insufficient documentation

## 2022-09-01 DIAGNOSIS — R42 Dizziness and giddiness: Secondary | ICD-10-CM | POA: Insufficient documentation

## 2022-09-01 NOTE — Telephone Encounter (Signed)
Pt seen in office today.

## 2022-09-01 NOTE — Progress Notes (Unsigned)
Subjective:    Patient ID: Teresa Rangel, female    DOB: [birth year], 81 y.o.   MRN: [id number]  Chief Complaint  Patient presents with   Hypertension    Was seen at ED and walk in clinic for BP high, light headed still, pt doesn't feel right, states he feels lathargic, limbs are tingly;     HPI Patient walked into office today with her husband for an unscheduled appt first thing this morning. Concerns about blood pressure fluctuations and elevations. Last night extreme arm and leg tingling, going into this morning. Very lethargic today.  Dizzy and shaky right now.  Feels like she's moving when she closes her eyes.  "I think I have a brain tumor."  She started back on Prozac 5-6 days ago.   184/78 BP this morning at home. Feeling a pulse beating in her right ear. Denies any chest pain or SOB. No weakness of one extremity or slurred speech. No severe headache.   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   Benign     Past Surgical History:  Procedure Laterality Date   [place]   THYROIDECTOMY  2007   TONSILLECTOMY  1949    Family History  Problem Relation Age of Onset   Breast cancer Maternal Aunt 72    Social History   Tobacco Use   Smoking status: Never   Smokeless tobacco: Never  Vaping Use   Vaping Use: Never used  Substance Use Topics   Alcohol use: Not Currently    Alcohol/week: 1.0 standard drink of alcohol    Types: 1 Glasses of wine per week    Comment: occassional   Drug use: No     Allergies  Allergen Reactions   Molds & Smuts     Other reaction(s): cough   Tetracyclines & Related     Stomach cramps    Review of Systems NEGATIVE UNLESS OTHERWISE INDICATED IN HPI      Objective:     BP (!) 150/68 (BP Location: Right Arm)   Pulse 67    Temp 97.7 F (36.5 C) (Temporal)   Ht '5\' 4"'$  (1.626 m)   LMP  (LMP Unknown)   SpO2 100%   BMI 27.66 kg/m   Wt Readings from Last 3 Encounters:  08/28/22 161 lb 2.5 oz (73.1 kg)  08/17/22 161 lb 2 oz (73.1 kg)  05/31/22 158 lb (71.7 kg)    BP Readings from Last 3 Encounters:  09/01/22 (!) 150/68  08/29/22 (!) 185/68  08/25/22 136/78     Physical Exam Vitals and nursing note reviewed.  Constitutional:      Appearance: Normal appearance. She is normal weight. She is not toxic-appearing.  HENT:     Head: Normocephalic and atraumatic.  Eyes:     Extraocular Movements: Extraocular movements intact.     Conjunctiva/sclera: Conjunctivae normal.     Pupils: Pupils are equal, round, and reactive to light.  Cardiovascular:     Rate and Rhythm: Normal rate and regular rhythm.     Pulses: Normal pulses.     Heart sounds: Normal heart sounds.  Pulmonary:     Effort: Pulmonary effort is normal.     Breath sounds: Normal breath sounds.  Musculoskeletal:  General: Normal range of motion.     Cervical back: Normal range of motion and neck supple.  Skin:    General: Skin is warm and dry.  Neurological:     General: No focal deficit present.     Mental Status: She is alert and oriented to person, place, and time.     Cranial Nerves: Cranial nerves 2-12 are intact. No cranial nerve deficit or facial asymmetry.     Motor: No weakness, tremor, abnormal muscle tone or pronator drift.     Coordination: Romberg sign positive.     Comments: Full strength all extremities   Psychiatric:        Mood and Affect: Mood normal.        Behavior: Behavior normal.        Assessment & Plan:  Dizziness -     CT HEAD WO CONTRAST (5MM); Future  New onset headache -     CT HEAD WO CONTRAST (5MM); Future  Essential hypertension -     CT HEAD WO CONTRAST (5MM); Future    81 year old female with elevated blood pressure readings, intermittent dizziness, new headaches, walking into the  office today not feeling well.  Thankfully, she is essentially neurologically intact, but her Romberg is positive.  She is very concerned that something is wrong with her brain and we discussed options.  Her blood pressure was very elevated upon initial check, but did come down to a better range as she was waiting.  We agreed that as she was feeling better as she was sitting at the office, we could go ahead with an outpatient CT of her head to reassure no acute findings or concerns such as a tumor.  If she were to have any sudden, severe change in symptoms, she would present to the emergency department.  Patient is happy with this plan and we were able to set her up with a CT today.  She has an appointment with cardiology tomorrow to help with some of her blood pressure concerns.  She is still waiting on an appointment for an echocardiogram and a Doppler of her carotids.  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  Stress remedies encouraged.  Patient and husband are agreeable and understanding of plan and understand red flags when to present to the ED if needed.    This note was prepared with assistance of Systems analyst. Occasional wrong-word or sound-a-like substitutions may have occurred due to the inherent limitations of voice recognition software.     Priscille Shadduck M Jowanda Heeg, PA-C

## 2022-09-01 NOTE — Telephone Encounter (Signed)
Please see pt recent message and advise

## 2022-09-01 NOTE — Telephone Encounter (Signed)
Patient seen in office today. 

## 2022-09-02 ENCOUNTER — Telehealth: Payer: Self-pay

## 2022-09-02 ENCOUNTER — Encounter: Payer: Self-pay | Admitting: Cardiology

## 2022-09-02 ENCOUNTER — Ambulatory Visit: Payer: Medicare HMO | Attending: Cardiovascular Disease | Admitting: Cardiology

## 2022-09-02 VITALS — BP 166/80 | HR 84 | Ht 64.0 in | Wt 160.0 lb

## 2022-09-02 DIAGNOSIS — I10 Essential (primary) hypertension: Secondary | ICD-10-CM | POA: Diagnosis not present

## 2022-09-02 DIAGNOSIS — R42 Dizziness and giddiness: Secondary | ICD-10-CM

## 2022-09-02 MED ORDER — AMLODIPINE BESYLATE 10 MG PO TABS: 10.0000 mg | ORAL_TABLET | Freq: Every day | ORAL | 3 refills | Status: DC

## 2022-09-02 MED ORDER — IRBESARTAN 300 MG PO TABS: 300.0000 mg | ORAL_TABLET | Freq: Every day | ORAL | 3 refills | Status: DC

## 2022-09-02 NOTE — Addendum Note (Signed)
Addended by: Joni Reining on: 09/02/2022 01:57 PM   Modules accepted: Orders

## 2022-09-02 NOTE — Telephone Encounter (Signed)
Patient saw Dr. Radford Pax today and Itamar sleeps study was given to the patient. Patient is aware that she will be called with a PIN number to complete the study once we receive approval from her insurance.

## 2022-09-02 NOTE — Patient Instructions (Signed)
Medication Instructions:  Your physician has recommended you make the following change in your medication:   Stop taking fosinipril 10 mg tablets.  Stop taking amlodipine 5 mg tablets.   Start taking Irbesartan 300 mg daily.  Start taking amlodipine 10 mg tablets daily.  *If you need a refill on your cardiac medications before your next appointment, please call your pharmacy*   Lab Work: Please complete a 24 hour urine study. Please complete a BMET with our lab in 1 week.  If you have labs (blood work) drawn today and your tests are completely normal, you will receive your results only by: [place] (if you have MyChart) OR A paper copy in the mail If you have any lab test that is abnormal or we need to change your treatment, we will call you to review the results.   Testing/Procedures: Your physician has requested that you have a renal artery duplex. During this test, an ultrasound is used to evaluate blood flow to the kidneys. Allow one hour for this exam. Do not eat after midnight the day before and avoid carbonated beverages. Take your medications as you usually do.  Your physician has recommended that you have a sleep study. This test records several body functions during sleep, including: brain activity, eye movement, oxygen and carbon dioxide blood levels, heart rate and rhythm, breathing rate and rhythm, the flow of air through your mouth and nose, snoring, body muscle movements, and chest and belly movement.  Follow-Up:  Your next appointment will depend on the results of your tests.   Provider:   Fransico Him, MD     Other Instructions Please also complete a blood pressure log. Check your blood pressure twice daily for one week. Then call our office to discuss the log.   Important Information About Sugar

## 2022-09-02 NOTE — Progress Notes (Signed)
Cardiology CONSULT Note    Date:  09/02/2022   ID:  [name], [name] Apr 25, 1941, MRN [id number]  PCP:  Fredirick Lathe, PA-C  Cardiologist:  Fransico Him, MD   Chief Complaint  Patient presents with   New Patient (Initial Visit)    Resistant HTN and dizziness    History of Present Illness:  Teresa Rangel is a 81 y.o. female who is being seen today for the evaluation of HTN at the request of Allwardt, Alyssa M, PA-C.  This is an 81yo female with a hx of HLD, HTN and hypothyroidism who has been having episodes of dizziness recently. She was seen by her PCP yesterday after being in the walkin clinic and ER for elevated Bp and lightheadedness as well as lethargy and tingling extremities. Apparently she has had episodes where her arm and leg start tingling that lasts hours at a time. She also feels like she is moving when she closes her eyes.  She apparently started Prozac 5-6 days ago.  Her BP yesterday was 184/65mhg.  She can also feel her heart beating in her ear.  She had a head CT yesterday that showed chronic microvascular changes o/w normal.  She is referred to cardiology for BP management.  She has an echo and carotid dopplers already ordered.  Her PCP was concerned that her sx were related to stress.  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  She took deep breaths and it passed within 10 seconds.  She tells me her sx are worse in the am and at night.  When she wakes up in the am she will feel the symptoms before even getting out of bed.  She says that she will start shaking and her BP yesterday was 210/102mg.  She describes the dizziness as feeling like she is going to fall over because of her balance but sometimes does not feel cognitively like she is there. She checks her BP with each episode and it is always very  high.    She admits to using some table salt but not much.  She eats out 50% of the time.  She has some mild chest pain midsternal like a pinch and stabbing for a few seconds and then goes away for the past 10 days since these episodes started.  This pain is nonexertional and usually occurs when sitting and is very subtle.  She has had some sensations of note being able to get a deep breath  as well starting around the same time period.  She denies any nausea, SOB or diaphoresis with her episodes.  She denies any palpitations but has had a dull HA for the past 10 days.   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 1 tablet in the am 1/2 tablet pm   cholecalciferol (VITAMIN D) 1000 UNITS tablet Take 2,000 Units by mouth daily.   FLUoxetine (PROZAC) 20 MG capsule Take 1 capsule (20 mg total) by mouth every morning.   fluticasone (FLONASE) 50 MCG/ACT nasal spray Place 2 sprays into both nostrils daily.   fluticasone furoate-vilanterol (BREO ELLIPTA) 200-25 MCG/ACT AEPB Inhale 1 puff into the lungs daily.   fosinopril (MONOPRIL) 40 MG tablet TAKE ONE TABLET BY MOUTH DAILY   hydrochlorothiazide (HYDRODIURIL) 25 MG tablet TAKE 1 TABLET BY MOUTH DAILY   levothyroxine (SYNTHROID) 88 MCG tablet Take 1 tablet (88 mcg total) by mouth daily before breakfast.   pravastatin (PRAVACHOL) 20 MG tablet TAKE 1 TABLET BY MOUTH DAILY    SHINGRIX injection    vitamin C (ASCORBIC ACID) 500 MG tablet Take 500 mg by mouth daily.   [DISCONTINUED] amLODipine (NORVASC) 5 MG tablet TAKE 1 TABLET BY MOUTH DAILY (Patient taking differently: Take 5 mg by mouth daily. 1 tablet in the am and a 1/2  tablet in pm)    Allergies:   Molds & smuts and Tetracyclines & related   Social History   Socioeconomic History   Marital status: Married    Spouse name: Not on file   Number of children: 2   Years of education: Not on file   Highest education level: Not on file  Occupational History   Occupation: Retired  Tobacco Use   Smoking status: Never   Smokeless tobacco: Never  Vaping Use   Vaping Use: Never used  Substance and Sexual Activity   Alcohol use: Not Currently    Alcohol/week: 1.0 standard drink of alcohol    Types: 1 Glasses of wine per week    Comment: occassional   Drug use: No   Sexual activity: Yes    Partners: Male  Other Topics Concern   Not on file  Social History Narrative   Part time Pharmacist, 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   Financial Resource Strain: Low Risk  (05/31/2022)   Overall Financial Resource Strain (CARDIA)    Difficulty of Paying Living Expenses: Not hard at all  Food Insecurity: No Food Insecurity (05/31/2022)   Hunger Vital Sign    Worried About Running Out of Food in the Last Year: Never true    [place] in the Last Year: Never true  Transportation Needs: No Transportation Needs (05/31/2022)   PRAPARE - Hydrologist (Medical): No    Lack of Transportation (Non-Medical): No  Physical Activity: Insufficiently Active (05/31/2022)   Exercise Vital Sign    Days of Exercise per Week: 1 day    Minutes of Exercise per Session: 30 min  Stress: No Stress Concern Present (05/31/2022)   [place]    Feeling of Stress : Not at all  Social Connections: [place]  (05/31/2022)   Social Connection and Isolation Panel [NHANES]    Frequency of Communication with Friends and Family: More than three times a week    Frequency of Social Gatherings with Friends and Family: More than three times a week    Attends Religious Services: More than 4 times per year    Active Member of [employer] or Organizations: Yes  Attends Archivist Meetings: 1 to 4 times per year    Marital Status: Married     Family History:  The patient's family history includes Breast cancer (age of onset: 53) in her maternal aunt.   ROS:   Please see the history of present illness.    ROS All other systems reviewed and are negative.      No data to display             PHYSICAL EXAM:   VS:  BP (!) 166/80   Pulse 84   Ht '5\' 4"'$  (1.626 m)   Wt 160 lb (72.6 kg)   LMP  (LMP Unknown)   SpO2 98%   BMI 27.46 kg/m    GEN: Well nourished, well developed, in no acute distress  HEENT: normal  Neck: no JVD, carotid bruits, or masses Cardiac: RRR; no murmurs, rubs, or gallops,no edema.  Intact distal pulses bilaterally.  Respiratory:  clear to auscultation bilaterally, normal work of breathing GI: soft, nontender, nondistended, + BS MS: no deformity or atrophy  Skin: warm and dry, no rash Neuro:  Alert and Oriented x 3, Strength and sensation are intact Psych: euthymic mood, full affect  Wt Readings from Last 3 Encounters:  09/02/22 160 lb (72.6 kg)  08/28/22 161 lb 2.5 oz (73.1 kg)  08/17/22 161 lb 2 oz (73.1 kg)      Studies/Labs Reviewed:   EKG:  EKG is not ordered today.  EKG done in office 08/31/2022 showed NSR with no ST changes  Recent Labs: 08/17/2022: ALT 15; Magnesium 2.1; TSH 0.36 08/28/2022: BUN 15; Creatinine, Ser 0.76; Hemoglobin 14.9; Platelets 240; Potassium 3.3; Sodium 137   Lipid Panel    Component Value Date/Time   CHOL 189 02/02/2022 1029   TRIG 116.0 02/02/2022 1029   HDL 55.40 02/02/2022 1029   CHOLHDL 3 02/02/2022 1029   VLDL 23.2  02/02/2022 1029   LDLCALC 111 (H) 02/02/2022 1029   LDLCALC 156 (H) 06/04/2020 1215   LDLDIRECT 133.1 12/07/2012 1049     Additional studies/ records that were reviewed today include:  OV notes from PCP    ASSESSMENT:    1. Essential hypertension   2. Dizziness      PLAN:  In order of problems listed above:  HTN -she has been having resistant HTN but her PCP was concerned that some of this may be related to stress around the holidays -she is on Amlodipine '5mg'$  daily, Fosinopril '40mg'$  daily, HCTZ '25mg'$  daily -I have recommended changing her ACE I to Irbesartan '300mg'$  daily -check BP and HR twice daily for a week and call with results -check BMET in 1 week -continue HCTZ '25mg'$  daily for now -increase amlodipine to '10mg'$  daily -await results of her 2D echo -check renal duplex to rule out RAS -check 24 hour urine for catechols, VMA, metaneprhines, dopamine -followup with [facility] in 2 weeks in HTN clinic -if BP is not better on the ARB and higher dose of amlodipine, then will change HCTZ to Chlorthalidone and also consider adding a BB (once 24 hour urine collected) -check a home sleep study  2.  Dizziness/Body tingling -this does not sound cardiac>>she feels off balance when she closes her eyes>> -she also has arm and leg tingling and again does not sound cardiac>>head CT with NAD -?whether she is hyperventilating which would cause diffuse body tingling -suspect her sx are mainly related to anxiety -2D echo and carotid dopplers pending  Folowup with me in 4  weeks  Time Spent: 20 minutes total time of encounter, including 15 minutes spent in face-to-face patient care on the date of this encounter. This time includes coordination of care and counseling regarding above mentioned problem list. Remainder of non-face-to-face time involved reviewing chart documents/testing relevant to the patient encounter and documentation in the medical record. I have independently reviewed  documentation from referring provider  Medication Adjustments/Labs and Tests Ordered: Current medicines are reviewed at length with the patient today.  Concerns regarding medicines are outlined above.  Medication changes, Labs and Tests ordered today are listed in the Patient Instructions below.  There are no Patient Instructions on file for this visit.   Signed, Fransico Him, MD  09/02/2022 1:22 PM    [facility] [address], [place], [place]  [address] Phone: [phone]; Fax: [phone]

## 2022-09-03 ENCOUNTER — Encounter (HOSPITAL_BASED_OUTPATIENT_CLINIC_OR_DEPARTMENT_OTHER): Payer: Medicare HMO | Admitting: Cardiology

## 2022-09-03 ENCOUNTER — Encounter: Payer: Self-pay | Admitting: Cardiology

## 2022-09-03 DIAGNOSIS — G4733 Obstructive sleep apnea (adult) (pediatric): Secondary | ICD-10-CM | POA: Diagnosis not present

## 2022-09-03 NOTE — Telephone Encounter (Signed)
Left message for the pt the she has been approved and ok to proceed with Itamar . Left PIN# [id number]. Left message if she can do the study between tonight and new years.

## 2022-09-04 ENCOUNTER — Encounter: Payer: Self-pay | Admitting: Cardiology

## 2022-09-04 NOTE — Procedures (Signed)
SLEEP STUDY REPORT Patient Information Study Date: 09/03/2022 Patient Name: Teresa Rangel Patient ID: [id number] Birth Date: [birth year] Age: 81 Gender:  BMI: 27.5 (W=161 lb, H=5' 4'') Referring Physician: Fransico Him, MD  TEST DESCRIPTION: Home sleep apnea testing was completed using the WatchPat, a Type 1 device, utilizing peripheral arterial tonometry (PAT), chest movement, actigraphy, pulse oximetry, pulse rate, body position and snore. AHI was calculated with apnea and hypopnea using valid sleep time as the denominator. RDI includes apneas, hypopneas, and RERAs. The data acquired and the scoring of sleep and all associated events were performed in accordance with the recommended standards and specifications as outlined in the AASM Manual for the Scoring of Sleep and Associated Events 2.2.0 (2015).   FINDINGS:   1. Mild Obstructive Sleep Apnea with AHI 10.7/hr.   2. No Central Sleep Apnea with pAHIc 0/hr.   3. Oxygen desaturations as low as 83%.   4. Moderate snoring was present. O2 sats were < 88% for 1.3 min.   5. Total sleep time was 8 hrs and 42 min.   6. 40.1% of total sleep time was spent in REM sleep.   7. Normal sleep onset latency at 17 min.   8. Normal REM sleep onset latency at 96 min.   9. Total awakenings were 5.  10. Arrhythmia detection:  None.  DIAGNOSIS: Mild Obstructive Sleep Apnea (G47.33)  RECOMMENDATIONS:   1.  Clinical correlation of these findings is necessary.  The decision to treat obstructive sleep apnea (OSA) is usually based on the presence of apnea symptoms or the presence of associated medical conditions such as Hypertension, Congestive Heart Failure, Atrial Fibrillation or Obesity.  The most common symptoms of OSA are snoring, gasping for breath while sleeping, daytime sleepiness and fatigue.   2.  Initiating apnea therapy is recommended given the presence of symptoms and/or associated conditions. Recommend proceeding with one of the  following:     a.  Auto-CPAP therapy with a pressure range of 5-20cm H2O.     b.  An oral appliance (OA) that can be obtained from certain dentists with expertise in sleep medicine.  These are primarily of use in non-obese patients with mild and moderate disease.     c.  An ENT consultation which may be useful to look for specific causes of obstruction and possible treatment options.     d.  If patient is intolerant to PAP therapy, consider referral to ENT for evaluation for hypoglossal nerve stimulator.   3.  Close follow-up is necessary to ensure success with CPAP or oral appliance therapy for maximum benefit.  4.  A follow-up oximetry study on CPAP is recommended to assess the adequacy of therapy and determine the need for supplemental oxygen or the potential need for Bi-level therapy.  An arterial blood gas to determine the adequacy of baseline ventilation and oxygenation should also be considered.  5.  Healthy sleep recommendations include:  adequate nightly sleep (normal 7-9 hrs/night), avoidance of caffeine after noon and alcohol near bedtime, and maintaining a sleep environment that is cool, dark and quiet.  6.  Weight loss for overweight patients is recommended.  Even modest amounts of weight loss can significantly improve the severity of sleep apnea.  7.  Snoring recommendations include:  weight loss where appropriate, side sleeping, and avoidance of alcohol before bed.  8.  Operation of motor vehicle should be avoided when sleepy.  Signature: Fransico Him, MD; [facility]; [place], [place] Board of Sleep Medicine Electronically Signed: 09/04/2022

## 2022-09-06 ENCOUNTER — Encounter: Payer: Self-pay | Admitting: Cardiology

## 2022-09-07 ENCOUNTER — Ambulatory Visit: Payer: Medicare HMO | Attending: Cardiology

## 2022-09-07 DIAGNOSIS — I10 Essential (primary) hypertension: Secondary | ICD-10-CM

## 2022-09-08 ENCOUNTER — Other Ambulatory Visit: Payer: Self-pay | Admitting: *Deleted

## 2022-09-08 ENCOUNTER — Ambulatory Visit: Payer: Medicare HMO

## 2022-09-08 ENCOUNTER — Encounter: Payer: Self-pay | Admitting: Physician Assistant

## 2022-09-08 ENCOUNTER — Ambulatory Visit (HOSPITAL_COMMUNITY)
Admission: RE | Admit: 2022-09-08 | Discharge: 2022-09-08 | Disposition: A | Discharge status: Home / Self Care | Payer: Medicare HMO | Source: Ambulatory Visit | Attending: Cardiovascular Disease | Admitting: Cardiovascular Disease

## 2022-09-08 ENCOUNTER — Telehealth: Payer: Self-pay | Admitting: *Deleted

## 2022-09-08 ENCOUNTER — Ambulatory Visit (HOSPITAL_BASED_OUTPATIENT_CLINIC_OR_DEPARTMENT_OTHER): Payer: Medicare HMO

## 2022-09-08 DIAGNOSIS — R42 Dizziness and giddiness: Secondary | ICD-10-CM

## 2022-09-08 DIAGNOSIS — I10 Essential (primary) hypertension: Secondary | ICD-10-CM

## 2022-09-08 DIAGNOSIS — G4733 Obstructive sleep apnea (adult) (pediatric): Secondary | ICD-10-CM

## 2022-09-08 LAB — BASIC METABOLIC PANEL
BUN/Creatinine Ratio: 26 (ref 12–28)
BUN: 19 mg/dL (ref 8–27)
CO2: 24 mmol/L (ref 20–29)
Calcium: 9.7 mg/dL (ref 8.7–10.3)
Chloride: 100 mmol/L (ref 96–106)
Creatinine, Ser: 0.74 mg/dL (ref 0.57–1.00)
Glucose: 137 mg/dL — ABNORMAL HIGH (ref 70–99)
Potassium: 4.4 mmol/L (ref 3.5–5.2)
Sodium: 140 mmol/L (ref 134–144)
eGFR: 81 mL/min/{1.73_m2} (ref >59)

## 2022-09-08 LAB — ECHOCARDIOGRAM COMPLETE
Area-P 1/2: 3.42 cm2
S' Lateral: 1.7 cm

## 2022-09-08 NOTE — Telephone Encounter (Signed)
-----   Message from Sueanne Margarita, MD sent at 09/04/2022  6:31 PM EST ----- Please let patient know that they have sleep apnea and recommend treating with CPAP.  Please order an auto CPAP from 4-15cm H2O with heated humidity and mask of choice.  Order overnight pulse ox on CPAP.  Followup with me in 6 weeks.

## 2022-09-08 NOTE — Addendum Note (Signed)
Addended by: Freada Bergeron on: 09/08/2022 06:43 PM   Modules accepted: Orders

## 2022-09-08 NOTE — Telephone Encounter (Signed)
RETURN CALL: The patient has been notified of the result and verbalized understanding.  All questions (if any) were answered. Teresa Rangel, [place] 09/08/2022 6:38 PM    Upon patient request DME selection is [facility]. Patient understands he will be contacted by [facility] to set up his cpap. Patient understands to call if [facility] does not contact him with new setup in a timely manner. Patient understands they will be called once confirmation has been received from [facility]/ that they have received their new machine to schedule 10 week follow up appointment.   [facility] notified of new cpap order  Please add to airview Patient was grateful for the call and thanked me.

## 2022-09-08 NOTE — Telephone Encounter (Signed)
The patient has been notified of the result. Left detailed message on voicemail and informed patient to call back..Kamoni Depree Green, CMA   

## 2022-09-09 ENCOUNTER — Ambulatory Visit (HOSPITAL_COMMUNITY)
Admission: RE | Admit: 2022-09-09 | Discharge: 2022-09-09 | Disposition: A | Discharge status: Home / Self Care | Payer: Medicare HMO | Source: Ambulatory Visit | Attending: Cardiology | Admitting: Cardiology

## 2022-09-09 ENCOUNTER — Encounter: Payer: Self-pay | Admitting: Physician Assistant

## 2022-09-09 DIAGNOSIS — I10 Essential (primary) hypertension: Secondary | ICD-10-CM | POA: Diagnosis not present

## 2022-09-09 NOTE — Telephone Encounter (Signed)
Please see pt note as she is requesting something for anxiety that is fast acting. Thanks

## 2022-09-09 NOTE — Telephone Encounter (Signed)
Please see pt response, thanks  Teresa Rangel, [place]

## 2022-09-10 ENCOUNTER — Other Ambulatory Visit: Payer: Self-pay | Admitting: Physician Assistant

## 2022-09-10 LAB — CATECHOLAMINES, FRACTIONATED, URINE, 24 HOUR
Dopamine , 24H Ur: 192 ug/24 hr (ref 0–510)
Dopamine, Rand Ur: 128 ug/L
Epinephrine, 24H Ur: <5 ug/24 hr (ref 0–20)
Epinephrine, Rand Ur: <3 ug/L
Norepinephrine, 24H Ur: 48 ug/24 hr (ref 0–135)
Norepinephrine, Rand Ur: 32 ug/L

## 2022-09-10 LAB — METANEPHRINES, URINE, 24 HOUR
Metaneph Total, Ur: 138 ug/L
Metanephrines, 24H Ur: 207 ug/24 hr (ref 36–209)
Normetanephrine, 24H Ur: 441 ug/24 hr (ref 131–612)
Normetanephrine, Ur: 294 ug/L

## 2022-09-10 LAB — VANILLYLMANDELIC ACID, 24-HR U
VMA, 24H Ur Adult: 6.9 mg/24 hr (ref 0.0–7.5)
VMA, Urine: 4.6 mg/L

## 2022-09-10 MED ORDER — CLONAZEPAM 0.25 MG PO TBDP: 0.2500 mg | ORAL_TABLET | Freq: Two times a day (BID) | ORAL | 0 refills | Status: DC | PRN

## 2022-09-14 ENCOUNTER — Encounter: Payer: Self-pay | Admitting: Cardiology

## 2022-09-14 DIAGNOSIS — I701 Atherosclerosis of renal artery: Secondary | ICD-10-CM

## 2022-09-14 DIAGNOSIS — I10 Essential (primary) hypertension: Secondary | ICD-10-CM

## 2022-09-15 ENCOUNTER — Encounter: Payer: Self-pay | Admitting: Cardiology

## 2022-09-15 MED ORDER — CHLORTHALIDONE 25 MG PO TABS: 25.0000 mg | ORAL_TABLET | Freq: Every day | ORAL | 3 refills | Status: DC

## 2022-09-15 NOTE — Telephone Encounter (Addendum)
Patient sent in BP readings.  See my chart message dated 09/14/22

## 2022-09-21 ENCOUNTER — Encounter: Payer: Self-pay | Admitting: Cardiology

## 2022-09-21 MED ORDER — CARVEDILOL 6.25 MG PO TABS: 6.2500 mg | ORAL_TABLET | Freq: Two times a day (BID) | ORAL | 3 refills | Status: DC

## 2022-09-22 ENCOUNTER — Encounter: Payer: Self-pay | Admitting: Cardiovascular Disease

## 2022-09-22 ENCOUNTER — Ambulatory Visit: Payer: Medicare HMO | Attending: Cardiovascular Disease | Admitting: Cardiovascular Disease

## 2022-09-22 VITALS — BP 116/50 | HR 64 | Ht 64.0 in

## 2022-09-22 DIAGNOSIS — I701 Atherosclerosis of renal artery: Secondary | ICD-10-CM | POA: Insufficient documentation

## 2022-09-22 NOTE — Assessment & Plan Note (Signed)
Teresa Rangel was referred to me by Dr. Radford Pax for evaluation of potential renal vascular hypertension.  She does have difficult to control hypertension now on 4 antihypertensive medicines with a blood pressure of 116/50.  She feels somewhat dizzy which is I suspect is related to the recent addition of carvedilol.  She had renal Doppler studies performed by Dr. Radford Pax 09/09/2022 revealing a high-frequency signal in her proximal right renal artery with a renal aortic ratio 3.5 on that side and symmetric renal dimensions.  It certainly possible that her hypertension is renal vascularly mediated.  I am Going to cut her Avapro from 300-1 50.  She IS also on chlorthalidone and amlodipine.  Will check a 30-day blood pressure log and have her see a Pharm.D. back in 4 weeks.  I am going to get a abdominal CTA to further evaluate her renal artery.  If her blood pressures are still elevated and her CTA is consistent with renal artery stenosis we will discuss renal artery stenting.

## 2022-09-22 NOTE — Progress Notes (Signed)
09/22/2022 Teresa Rangel Name   [birth year]  [id number]  Primary Physician Allwardt, Randa Evens, PA-C Primary Cardiologist: Lorretta Harp MD Lupe Carney, [place]  HPI:  Viriginia Rangel is a 82 y.o. mildly overweight married Caucasian female mother of 2 children, grandmother of 4 grandchildren who was referred by Dr. Radford Pax, her primary cardiologist, for evaluation of renal vascular hypertension.  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  She does have treated hypertension and hyperlipidemia.  She is never had heart attack or stroke.  There is no family history for heart disease.  She has had difficult to control hypertension for years.  Dr. Radford Pax recently added Avapro and doubled her amlodipine, change her hydrochlorothiazide to chlorthalidone.  Her blood pressure today is 116/50 and she feels somewhat dizzy.  She did get renal Doppler studies on her 09/01/2022 that suggested right renal artery stenosis.  She is not very active but denies chest pain or shortness of breath.   Current Meds  Medication Sig   albuterol (VENTOLIN HFA) 108 (90 Base) MCG/ACT inhaler INHALE TWO PUFFS BY MOUTH EVERY 6 HOURS AS NEEDED FOR WHEEZING OR FOR SHORTNESS OF BREATH   amLODipine (NORVASC) 10 MG tablet Take 1 tablet (10 mg total) by mouth daily.   carvedilol (COREG) 6.25 MG tablet Take 1 tablet (6.25 mg total) by mouth 2 (two) times daily.   chlorthalidone (HYGROTON) 25 MG tablet Take 1 tablet (25 mg total) by mouth daily.   cholecalciferol (VITAMIN D) 1000 UNITS tablet Take 2,000 Units by mouth daily.   clonazePAM (KLONOPIN) 0.25 MG disintegrating tablet Take 1 tablet (0.25 mg total) by mouth 2 (two) times daily as needed (anxiety).   FLUoxetine (PROZAC) 20 MG capsule Take 1 capsule (20 mg total) by mouth every morning.   fluticasone (FLONASE) 50 MCG/ACT nasal spray Place 2 sprays into both nostrils daily.   fluticasone furoate-vilanterol (BREO ELLIPTA) 200-25 MCG/ACT AEPB Inhale 1 puff into  the lungs daily.   irbesartan (AVAPRO) 300 MG tablet Take 1 tablet (300 mg total) by mouth daily.   levothyroxine (SYNTHROID) 88 MCG tablet Take 1 tablet (88 mcg total) by mouth daily before breakfast.   pravastatin (PRAVACHOL) 20 MG tablet TAKE 1 TABLET BY MOUTH DAILY   SHINGRIX injection    vitamin C (ASCORBIC ACID) 500 MG tablet Take 500 mg by mouth daily.     Allergies  Allergen Reactions   Molds & Smuts     Other reaction(s): cough   Tetracyclines & Related     Stomach cramps    Social History   Socioeconomic History   Marital status: Married    Spouse name: Not on file   Number of children: 2   Years of education: Not on file   Highest education level: Not on file  Occupational History   Occupation: Retired  Tobacco Use   Smoking status: Never   Smokeless tobacco: Never  Vaping Use   Vaping Use: Never used  Substance and Sexual Activity   Alcohol use: Not Currently    Alcohol/week: 1.0 standard drink of alcohol    Types: 1 Glasses of wine per week    Comment: occassional   Drug use: No   Sexual activity: Yes    Partners: Male  Other Topics Concern   Not on file  Social History Narrative   Part time Pharmacist, hospital at [place] Strain: Low Risk  (05/31/2022)  Overall Financial Resource Strain (CARDIA)    Difficulty of Paying Living Expenses: Not hard at all  Food Insecurity: No Food Insecurity (05/31/2022)   Hunger Vital Sign    Worried About Running Out of Food in the Last Year: Never true    Ran Out of Food in the Last Year: Never true  Transportation Needs: No Transportation Needs (05/31/2022)   PRAPARE - Hydrologist (Medical): No    Lack of Transportation (Non-Medical): No  Physical Activity: Insufficiently Active (05/31/2022)   Exercise Vital Sign    Days of Exercise per Week: 1 day    Minutes of Exercise per Session: 30 min  Stress: No Stress Concern Present  (05/31/2022)   [place]    Feeling of Stress : Not at all  Social Connections: [place] (05/31/2022)   Social Connection and Isolation Panel [NHANES]    Frequency of Communication with Friends and Family: More than three times a week    Frequency of Social Gatherings with Friends and Family: More than three times a week    Attends Religious Services: More than 4 times per year    Active Member of [employer] or Organizations: Yes    Attends Archivist Meetings: 1 to 4 times per year    Marital Status: Married  Human resources officer Violence: Not At Risk (05/31/2022)   Humiliation, Afraid, Rape, and Kick questionnaire    Fear of Current or Ex-Partner: No    Emotionally Abused: No    Physically Abused: No    Sexually Abused: No     Review of Systems: General: negative for chills, fever, night sweats or weight changes.  Cardiovascular: negative for chest pain, dyspnea on exertion, edema, orthopnea, palpitations, paroxysmal nocturnal dyspnea or shortness of breath Dermatological: negative for rash Respiratory: negative for cough or wheezing Urologic: negative for hematuria Abdominal: negative for nausea, vomiting, diarrhea, bright red blood per rectum, melena, or hematemesis Neurologic: negative for visual changes, syncope, or dizziness All other systems reviewed and are otherwise negative except as noted above.    Blood pressure (!) 116/50, pulse 64, height '5\' 4"'$  (1.626 m).  General appearance: alert and no distress Neck: no adenopathy, no carotid bruit, no JVD, supple, symmetrical, trachea midline, and thyroid not enlarged, symmetric, no tenderness/mass/nodules Lungs: clear to auscultation bilaterally Heart: regular rate and rhythm, S1, S2 normal, no murmur, click, rub or gallop Extremities: extremities normal, atraumatic, no cyanosis or edema Pulses: 2+ and symmetric Skin: Skin color, texture, turgor  normal. No rashes or lesions Neurologic: Grossly normal  EKG not performed today  ASSESSMENT AND PLAN:   Renal artery stenosis (HCC) Teresa Rangel was referred to me by Dr. Radford Pax for evaluation of potential renal vascular hypertension.  She does have difficult to control hypertension now on 4 antihypertensive medicines with a blood pressure of 116/50.  She feels somewhat dizzy which is I suspect is related to the recent addition of carvedilol.  She had renal Doppler studies performed by Dr. Radford Pax 09/09/2022 revealing a high-frequency signal in her proximal right renal artery with a renal aortic ratio 3.5 on that side and symmetric renal dimensions.  It certainly possible that her hypertension is renal vascularly mediated.  I am Going to cut her Avapro from 300-1 50.  She IS also on chlorthalidone and amlodipine.  Will check a 30-day blood pressure log and have her see a Pharm.D. back in 4 weeks.  I am going to  get a abdominal CTA to further evaluate her renal artery.  If her blood pressures are still elevated and her CTA is consistent with renal artery stenosis we will discuss renal artery stenting.     Lorretta Harp MD FACP,FACC,FAHA, FSCAI 09/22/2022 12:00 PM

## 2022-09-22 NOTE — Patient Instructions (Signed)
Medication Instructions:    DECREASE IRBESARTAN TO 150 MG ONCE DAILY=1/2 OF THE 300 MG TABLET ONCE DAILY  *If you need a refill on your cardiac medications before your next appointment, please call your pharmacy*   Testing/Procedures:  CTA OF THE ABDOMEN W/WO FOR RENAL ARTERY STENOSIS-[facility] - [place]: At [facility], you and your health needs are our priority.  As part of our continuing mission to provide you with exceptional heart care, we have created designated Provider Care Teams.  These Care Teams include your primary Cardiologist (physician) and Advanced Practice Providers (APPs -  Physician Assistants and Nurse Practitioners) who all work together to provide you with the care you need, when you need it.  We recommend signing up for the patient portal called "MyChart".  Sign up information is provided on this After Visit Summary.  MyChart is used to connect with patients for Virtual Visits (Telemedicine).  Patients are able to view lab/test results, encounter notes, upcoming appointments, etc.  Non-urgent messages can be sent to your provider as well.   To learn more about what you can do with MyChart, go to [email or web address].    Your next appointment:   3 month(s)  The format for your next appointment:   In Person  Provider:   Quay Burow MD   [address] [place]

## 2022-09-24 ENCOUNTER — Encounter: Payer: Self-pay | Admitting: Cardiology

## 2022-09-29 NOTE — Progress Notes (Signed)
Initial neurology clinic note  SERVICE DATE: 10/08/22  Reason for Evaluation: Consultation requested by Allwardt, Randa Evens, PA-C for an opinion regarding arm paresthesias and dizziness. My final recommendations will be communicated back to the requesting physician by way of shared medical record or letter to requesting physician via [place] mail.  HPI: This is Ms. Teresa Rangel, a 82 y.o. right-handed female with a medical history of HTN, renal artery stenosis, HLD, hypothyroidism, macular degeneration of left eye, OSA (mild, not yet on CPAP) who presents to neurology clinic with the chief complaint of arm paresthesias and dizziness. The patient is alone today.  Patient's husband has been sick with chronic pain. On 08/28/22 patient was playing piano at church and got a bad headache and started having trouble concentrating/focusing on the notes. She went to the walk in clinic and was shaky. She felt numb and tingling all over. Her BP was elevated into the 200s. It was felt that her anxiety was very high. When her BP returned to normal, she felt better. She mentions that she can also be dizzy. She describes the dizziness as lightheadedness and pressure around her head. All of her symptoms appear to occur when her BP is elevated and improves with improves when her BP improves. She mentions being an emotional person who is prone to swings in BP due to this per her report.  Patient mentions that the week prior she had been having both arms tingling. She first noticed it when she would be using her phone in bed. She would lay and relax and it would go away in 10 minutes. It is the entire arm from shoulder to hands on the posterior aspect of entire arm.  Patient was prescribed Prozac around 09/01/22. It took about 3 weeks to work. She mentions that the above symptoms are now much less. She also has Klonopin for anxiety, but patient has only taken 1 pill.  Patient was seen by cardiology on 09/02/22 and  BP medication changes were made.  Patient takes magnesium for RLS/cramps that helps some.  Patient was on B complex but this was started after symptoms started and was recently stopped due to running out.   She usually does not sleep well. She has mild OSA, but does not yet use CPAP. She will be looking into this after husband's surgery.  She report any constitutional symptoms like fever, night sweats, anorexia or unintentional weight loss.  EtOH use: None  Restrictive diet? No Family history of neuropathy/myopathy/NM disease? No; sister with anxiety   MEDICATIONS:  Outpatient Encounter Medications as of 10/08/2022  Medication Sig   albuterol (VENTOLIN HFA) 108 (90 Base) MCG/ACT inhaler INHALE TWO PUFFS BY MOUTH EVERY 6 HOURS AS NEEDED FOR WHEEZING OR FOR SHORTNESS OF BREATH   amLODipine (NORVASC) 10 MG tablet Take 1 tablet (10 mg total) by mouth daily. (Patient taking differently: Take 10 mg by mouth daily. 2 times)   carvedilol (COREG) 6.25 MG tablet Take 1 tablet (6.25 mg total) by mouth 2 (two) times daily.   chlorthalidone (HYGROTON) 25 MG tablet Take 1 tablet (25 mg total) by mouth daily.   cholecalciferol (VITAMIN D) 1000 UNITS tablet Take 2,000 Units by mouth daily.   clonazePAM (KLONOPIN) 0.25 MG disintegrating tablet Take 1 tablet (0.25 mg total) by mouth 2 (two) times daily as needed (anxiety).   FLUoxetine (PROZAC) 20 MG capsule Take 1 capsule (20 mg total) by mouth every morning.   fluticasone furoate-vilanterol (BREO ELLIPTA) 200-25 MCG/ACT AEPB Inhale  1 puff into the lungs daily.   SHINGRIX injection    fluticasone (FLONASE) 50 MCG/ACT nasal spray Place 2 sprays into both nostrils daily.   irbesartan (AVAPRO) 75 MG tablet Take 1 tablet (75 mg total) by mouth daily.   levothyroxine (SYNTHROID) 88 MCG tablet Take 1 tablet (88 mcg total) by mouth daily before breakfast.   pravastatin (PRAVACHOL) 20 MG tablet TAKE 1 TABLET BY MOUTH DAILY   vitamin C (ASCORBIC ACID) 500 MG  tablet Take 500 mg by mouth daily.   [DISCONTINUED] pravastatin (PRAVACHOL) 20 MG tablet TAKE 1 TABLET BY MOUTH DAILY   No facility-administered encounter medications on file as of 10/08/2022.    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   Benign     PAST SURGICAL HISTORY: Past Surgical History:  Procedure Laterality Date   [place]   THYROIDECTOMY  2007   TONSILLECTOMY  1949    ALLERGIES: Allergies  Allergen Reactions   Molds & Smuts     Other reaction(s): cough   Tetracyclines & Related     Stomach cramps    FAMILY HISTORY: Family History  Problem Relation Age of Onset   Breast cancer Maternal Aunt 6    SOCIAL HISTORY: Social History   Tobacco Use   Smoking status: Never   Smokeless tobacco: Never  Vaping Use   Vaping Use: Never used  Substance Use Topics   Alcohol use: Not Currently    Alcohol/week: 1.0 standard drink of alcohol    Types: 1 Glasses of wine per week    Comment: occassional   Drug use: No   Social History   Social History Narrative   Caffeine 1 cup a day   Are you right handed or left handed? Right   Are you currently employed ?    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 husband   Who lives with you?    What type of home do you live in: 1 story or 2 story? two         OBJECTIVE: PHYSICAL EXAM: BP 102/70   Pulse 63   Ht '5\' 4"'$  (1.626 m)   Wt 155 lb 6.4 oz (70.5 kg)   LMP  (LMP Unknown)   SpO2 98%   BMI 26.67 kg/m   General: General appearance: Awake and alert. No distress. Cooperative with exam.  Skin: No obvious rash or jaundice. HEENT: Atraumatic. Anicteric. Lungs:  Non-labored breathing on room air  Extremities: No edema. No obvious deformity.  Psych: Affect appropriate.  Neurological: Mental Status: Alert. Speech fluent. No pseudobulbar affect Cranial Nerves: CNII: No RAPD. Visual fields grossly intact. CNIII, IV, VI: PERRL. No nystagmus. EOMI. CN V: Facial sensation intact bilaterally to fine touch. CN VII: Facial muscles symmetric and strong. No ptosis at rest. CN VIII: Hearing grossly intact bilaterally. CN IX: No hypophonia. CN X: Palate elevates symmetrically. CN XI: Full strength shoulder shrug bilaterally. CN XII: Tongue protrusion full and midline. No atrophy or fasciculations. No significant dysarthria Motor: Tone is normal. Strength is 5/5 in bilateral upper and lower  extremities. Reflexes: 2+ bilaterally Pathological Reflexes: Hoffman: absent bilaterally Troemner: absent bilaterally Sensation: Pinprick: Intact in all extremities. Phalen and Tinel's test negative (at carpal tunnel and ulnar groove) bilaterally Coordination: Intact finger-to- nose-finger bilaterally. Romberg negative. Gait: Able to rise from chair with arms crossed unassisted. Normal, narrow-based gait.  Lab and Test Review: Internal labs: BMP (09/08/22): significant for elevated glucose to 137 CBC (08/28/22) unremarkable Mg (08/17/22): 2.1 B12 (08/17/22): 103 TSH (08/17/22): 0.36  Imaging: CT head (09/01/22): FINDINGS: Brain: No evidence of acute infarction, hemorrhage, hydrocephalus, extra-axial collection or mass lesion/mass effect. Progressive mild chronic microvascular ischemic changes.   Vascular: Atherosclerotic vascular calcification of the carotid siphons. No hyperdense vessel.   Skull: Negative for fracture or focal lesion.   Sinuses/Orbits: No acute finding.   Other: None.   IMPRESSION: 1. No acute intracranial abnormality. 2. Progressive mild chronic microvascular ischemic changes.  ASSESSMENT: Teresa Rangel is a 82 y.o. female  who presents for evaluation of arm paresthesias and dizziness. She has a relevant medical history of HTN, renal artery stenosis, HLD, hypothyroidism, macular degeneration of left eye, OSA. Her neurological examination is essentially normal today. Available diagnostic data is significant for B12 of 103. Patient's symptoms of difficulty concentrating and dizziness are likely due to hypertensive urgency and anxiety, as they resolve with improved BP per patient. Her arm paresthesias could also be due to the same, but tend to happen at different times. They do no conform to a nerve or root distribution though, making a radiculopathy or mononeuropathy less likely. B12 deficiency and poor sleep could be contributing to her symptoms.  PLAN: -Blood work: B1, B6, vit D -B12 supplementation 1000 mcg daily -Follow up with PCP regarding HTN, anxiety, and OSA as planned  -Return to clinic to be determined  The impression above as well as the plan as outlined below were extensively discussed with the patient who voiced understanding. All questions were answered to their satisfaction.  When available, results of the above investigations and possible further recommendations will be communicated to the patient via telephone/MyChart. Patient to call office if not contacted after expected testing turnaround time.   Total time spent reviewing records, interview, history/exam, documentation, and coordination of care on day of encounter:  50 min   Thank you for allowing me to participate in patient's care.  If I can answer any additional questions, I would be pleased to do so.  Kai Levins, MD   CC: Allwardt, Randa Evens, PA-C [place] [id number]  CC: Referring provider: Allwardt, Randa Evens, PA-C [address] [place],  [place] [address]

## 2022-09-30 ENCOUNTER — Ambulatory Visit: Payer: Medicare HMO | Admitting: Cardiovascular Disease

## 2022-09-30 DIAGNOSIS — H353121 Nonexudative age-related macular degeneration, left eye, early dry stage: Secondary | ICD-10-CM | POA: Diagnosis not present

## 2022-09-30 DIAGNOSIS — H52203 Unspecified astigmatism, bilateral: Secondary | ICD-10-CM | POA: Diagnosis not present

## 2022-09-30 DIAGNOSIS — H353111 Nonexudative age-related macular degeneration, right eye, early dry stage: Secondary | ICD-10-CM | POA: Diagnosis not present

## 2022-09-30 DIAGNOSIS — H524 Presbyopia: Secondary | ICD-10-CM | POA: Diagnosis not present

## 2022-10-01 ENCOUNTER — Ambulatory Visit: Payer: Medicare HMO | Admitting: Cardiovascular Disease

## 2022-10-02 ENCOUNTER — Other Ambulatory Visit: Payer: Self-pay | Admitting: Physician Assistant

## 2022-10-04 DIAGNOSIS — H353112 Nonexudative age-related macular degeneration, right eye, intermediate dry stage: Secondary | ICD-10-CM | POA: Diagnosis not present

## 2022-10-04 DIAGNOSIS — H353221 Exudative age-related macular degeneration, left eye, with active choroidal neovascularization: Secondary | ICD-10-CM | POA: Diagnosis not present

## 2022-10-04 DIAGNOSIS — H35033 Hypertensive retinopathy, bilateral: Secondary | ICD-10-CM | POA: Diagnosis not present

## 2022-10-04 DIAGNOSIS — H35372 Puckering of macula, left eye: Secondary | ICD-10-CM | POA: Diagnosis not present

## 2022-10-04 DIAGNOSIS — H43813 Vitreous degeneration, bilateral: Secondary | ICD-10-CM | POA: Diagnosis not present

## 2022-10-05 ENCOUNTER — Encounter: Payer: Self-pay | Admitting: Cardiology

## 2022-10-06 MED ORDER — IRBESARTAN 75 MG PO TABS: 75.0000 mg | ORAL_TABLET | Freq: Every day | ORAL | 3 refills | Status: DC

## 2022-10-08 ENCOUNTER — Other Ambulatory Visit (INDEPENDENT_AMBULATORY_CARE_PROVIDER_SITE_OTHER): Payer: Medicare HMO

## 2022-10-08 ENCOUNTER — Encounter: Payer: Self-pay | Admitting: Neurology

## 2022-10-08 ENCOUNTER — Ambulatory Visit: Payer: Medicare HMO | Admitting: Neurology

## 2022-10-08 VITALS — BP 102/70 | HR 63 | Ht 64.0 in | Wt 155.4 lb

## 2022-10-08 DIAGNOSIS — R209 Unspecified disturbances of skin sensation: Secondary | ICD-10-CM | POA: Diagnosis not present

## 2022-10-08 DIAGNOSIS — R69 Illness, unspecified: Secondary | ICD-10-CM | POA: Diagnosis not present

## 2022-10-08 DIAGNOSIS — I160 Hypertensive urgency: Secondary | ICD-10-CM

## 2022-10-08 DIAGNOSIS — E538 Deficiency of other specified B group vitamins: Secondary | ICD-10-CM

## 2022-10-08 DIAGNOSIS — R42 Dizziness and giddiness: Secondary | ICD-10-CM

## 2022-10-08 DIAGNOSIS — F419 Anxiety disorder, unspecified: Secondary | ICD-10-CM | POA: Diagnosis not present

## 2022-10-08 LAB — VITAMIN D 25 HYDROXY (VIT D DEFICIENCY, FRACTURES): VITD: 64.27 ng/mL (ref 30.00–100.00)

## 2022-10-08 NOTE — Patient Instructions (Addendum)
Your exam is normal and reassuring today. Your symptoms are likely related to blood pressure and anxiety, as you expected.  Your B12 was low. Given your symptoms, I would recommend supplementing with B12 1000 mcg daily. This can be bought over the counter at any local drug store or online.   I would like to do blood work today to make sure there is no other vitamin problems.  The physicians and staff at [facility] are committed to providing excellent care. You may receive a survey requesting feedback about your experience at our office. We strive to receive "very good" responses to the survey questions. If you feel that your experience would prevent you from giving the office a "very good " response, please contact our office to try to remedy the situation. We may be reached at [phone]. Thank you for taking the time out of your busy day to complete the survey.  Kai Levins, MD [facility]

## 2022-10-13 ENCOUNTER — Encounter: Payer: Self-pay | Admitting: Cardiology

## 2022-10-13 ENCOUNTER — Ambulatory Visit: Payer: Medicare HMO | Attending: Cardiology | Admitting: Cardiology

## 2022-10-13 VITALS — BP 130/52 | HR 60 | Ht 64.0 in | Wt 161.4 lb

## 2022-10-13 DIAGNOSIS — E785 Hyperlipidemia, unspecified: Secondary | ICD-10-CM | POA: Diagnosis not present

## 2022-10-13 DIAGNOSIS — I10 Essential (primary) hypertension: Secondary | ICD-10-CM | POA: Diagnosis not present

## 2022-10-13 DIAGNOSIS — G4733 Obstructive sleep apnea (adult) (pediatric): Secondary | ICD-10-CM

## 2022-10-13 DIAGNOSIS — I701 Atherosclerosis of renal artery: Secondary | ICD-10-CM

## 2022-10-13 LAB — VITAMIN B1: Vitamin B1 (Thiamine): 8 nmol/L (ref 8–30)

## 2022-10-13 LAB — VITAMIN B6: Vitamin B6: 52.6 ng/mL — ABNORMAL HIGH (ref 2.1–21.7)

## 2022-10-13 NOTE — Patient Instructions (Signed)
Medication Instructions:  Your physician recommends that you continue on your current medications as directed. Please refer to the Current Medication list given to you today.  *If you need a refill on your cardiac medications before your next appointment, please call your pharmacy*   Lab Work: Please complete a FASTING lipid panel and an ALT today in our lab before you leave.  If you have labs (blood work) drawn today and your tests are completely normal, you will receive your results only by: [place] (if you have MyChart) OR A paper copy in the mail If you have any lab test that is abnormal or we need to change your treatment, we will call you to review the results.   Testing/Procedures: None.   Follow-Up: At [facility], you and your health needs are our priority.  As part of our continuing mission to provide you with exceptional heart care, we have created designated Provider Care Teams.  These Care Teams include your primary Cardiologist (physician) and Advanced Practice Providers (APPs -  Physician Assistants and Nurse Practitioners) who all work together to provide you with the care you need, when you need it.  We recommend signing up for the patient portal called "MyChart".  Sign up information is provided on this After Visit Summary.  MyChart is used to connect with patients for Virtual Visits (Telemedicine).  Patients are able to view lab/test results, encounter notes, upcoming appointments, etc.  Non-urgent messages can be sent to your provider as well.   To learn more about what you can do with MyChart, go to [email or web address].    Your next appointment:  Our sleep team will contact you regarding setting up CPAP needs.  Provider:   Fransico Him, MD

## 2022-10-13 NOTE — Addendum Note (Signed)
Addended by: Joni Reining on: 10/13/2022 09:17 AM   Modules accepted: Orders

## 2022-10-13 NOTE — Progress Notes (Signed)
Cardiology Note    Date:  10/13/2022   ID:  Laural, Eiland 1940-11-27, MRN [id number]  PCP:  Fredirick Lathe, PA-C  Cardiologist:  Fransico Him, MD   Chief Complaint  Patient presents with   Follow-up    Hypertension, renal artery stenosis, sleep apnea    History of Present Illness:  Teresa Rangel is a 82 y.o. female  with a hx of HLD, HTN and hypothyroidism who has been having episodes of dizziness.  She was initially referred to cardiology for hypertension management.  2D echo 09/08/2022 showed EF 60 to 65% with grade 1 diastolic dysfunction, trivial MR.  Her BP has been difficult to control and renal artery Dopplers were performed which demonstrated > 60% stenosis of the right renal artery and 1 to 59% stenosis of the left renal artery.  Her BP meds were adjusted and she has been on amlodipine 10 mg daily, carvedilol 6.25 mg twice daily, Chlorthalidone 25 mg daily, irbesartan initially 300 mg daily but this was dropped down to 75 mg daily due to decrease in blood pressure after initiating beta-blocker therapy.    She was referred to Dr. Gwenlyn Found for vascular evaluation.  He felt there was a possibility that her hypertension was renovascular mediated.  She has an abdominal CTA pending.  She also was found to have mild obstructive sleep apnea with an AHI of 10/hr.  Auto CPAP from 4 to 15 cm H2O was ordered but she has not started it yet because her husband has been ill and she is taking care of him.  She is going to start it soon..  She is here today for followup and is doing well.  She denies any chest pain or pressure, SOB, DOE, PND, orthopnea, LE edema, palpitations or syncope. Occasionally she will get dizzy in the am after she takes her meds. She is compliant with her meds and is tolerating meds with no SE.    Past Medical History:  Diagnosis Date   History of stomach ulcers 1980   Hyperlipidemia    Hypertension    Hypothyroidism    Macular degeneration of left  eye    OSA (obstructive sleep apnea)    mild obstructive sleep apnea with an AHI of 10/hr.  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   carvedilol (COREG) 6.25 MG tablet Take 1 tablet (6.25 mg total) by mouth 2 (two) times daily.   chlorthalidone (HYGROTON) 25 MG tablet Take 1 tablet (25 mg total) by mouth daily.   cholecalciferol (VITAMIN D) 1000 UNITS tablet Take 2,000 Units by mouth daily.   FLUoxetine (PROZAC) 20 MG capsule Take 1 capsule (20 mg total) by mouth every morning.   fluticasone (FLONASE) 50 MCG/ACT nasal spray Place 2 sprays into both nostrils daily.  fluticasone furoate-vilanterol (BREO ELLIPTA) 200-25 MCG/ACT AEPB Inhale 1 puff into the lungs daily.   irbesartan (AVAPRO) 75 MG tablet Take 1 tablet (75 mg total) by mouth daily.   levothyroxine (SYNTHROID) 88 MCG tablet Take 1 tablet (88 mcg total) by mouth daily before breakfast.   pravastatin (PRAVACHOL) 20 MG tablet TAKE 1 TABLET BY MOUTH DAILY   vitamin B-12 (CYANOCOBALAMIN) 100 MCG tablet Take 100 mcg by mouth daily.   vitamin C (ASCORBIC ACID) 500 MG tablet Take 500 mg by mouth daily.    Allergies:   Molds & smuts and Tetracyclines & related   Social History   Socioeconomic History   Marital status: Married    Spouse name: Not on file   Number of children: 2   Years of education: Not on file   Highest education level: Not on file  Occupational History    Occupation: Retired  Tobacco Use   Smoking status: Never   Smokeless tobacco: Never  Vaping Use   Vaping Use: Never used  Substance and Sexual Activity   Alcohol use: Not Currently    Alcohol/week: 1.0 standard drink of alcohol    Types: 1 Glasses of wine per week    Comment: occassional   Drug use: No   Sexual activity: Yes    Partners: Male  Other Topics Concern   Not on file  Social History Narrative   Caffeine 1 cup a day   Are you right handed or left handed? Right   Are you currently employed ?    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 husband   Who lives with you?    What type of home do you live in: 1 story or 2 story? two       Social Determinants of Health   Financial Resource Strain: Low Risk  (05/31/2022)   Overall Financial Resource Strain (CARDIA)    Difficulty of Paying Living Expenses: Not hard at all  Food Insecurity: No Food Insecurity (05/31/2022)   Hunger Vital Sign    Worried About Running Out of Food in the Last Year: Never true    Ran Out of Food in the Last Year: Never true  Transportation Needs: No Transportation Needs (05/31/2022)   PRAPARE - Hydrologist (Medical): No    Lack of Transportation (Non-Medical): No  Physical Activity: Insufficiently Active (05/31/2022)   Exercise Vital Sign    Days of Exercise per Week: 1 day    Minutes of Exercise per Session: 30 min  Stress: No Stress Concern Present (05/31/2022)   Max    Feeling of Stress : Not at all  Social Connections: [place] (05/31/2022)   Social Connection and Isolation Panel [NHANES]    Frequency of Communication with Friends and Family: More than three times a week    Frequency of Social Gatherings with Friends and Family: More than three times a week    Attends Religious Services: More than 4 times per year    Active Member of [employer] or  Organizations: Yes    Attends Archivist Meetings: 1 to 4 times per year    Marital Status: Married     Family History:  The patient's family history includes Breast cancer (age of onset: 58) in her maternal aunt.   ROS:   Please see the history of present illness.    ROS All other  systems reviewed and are negative.      No data to display             PHYSICAL EXAM:   VS:  BP (!) 130/52 (BP Location: Right Arm, Patient Position: Sitting)   Pulse 60   Ht '5\' 4"'$  (1.626 m)   Wt 161 lb 6.4 oz (73.2 kg)   LMP  (LMP Unknown)   BMI 27.70 kg/m    GEN: Well nourished, well developed in no acute distress HEENT: Normal NECK: No JVD; No carotid bruits LYMPHATICS: No lymphadenopathy CARDIAC:RRR, no murmurs, rubs, gallops RESPIRATORY:  Clear to auscultation without rales, wheezing or rhonchi  ABDOMEN: Soft, non-tender, non-distended MUSCULOSKELETAL:  No edema; No deformity  SKIN: Warm and dry NEUROLOGIC:  Alert and oriented x 3 PSYCHIATRIC:  Normal affect  Wt Readings from Last 3 Encounters:  10/13/22 161 lb 6.4 oz (73.2 kg)  10/08/22 155 lb 6.4 oz (70.5 kg)  09/02/22 160 lb (72.6 kg)      Studies/Labs Reviewed:   EKG:  EKG is not ordered today.    Recent Labs: 08/17/2022: ALT 15; Magnesium 2.1; TSH 0.36 08/28/2022: Hemoglobin 14.9; Platelets 240 09/08/2022: BUN 19; Creatinine, Ser 0.74; Potassium 4.4; Sodium 140   Lipid Panel    Component Value Date/Time   CHOL 189 02/02/2022 1029   TRIG 116.0 02/02/2022 1029   HDL 55.40 02/02/2022 1029   CHOLHDL 3 02/02/2022 1029   VLDL 23.2 02/02/2022 1029   LDLCALC 111 (H) 02/02/2022 1029   LDLCALC 156 (H) 06/04/2020 1215   LDLDIRECT 133.1 12/07/2012 1049     Additional studies/ records that were reviewed today include:  OV notes from PCP    ASSESSMENT:    1. Essential hypertension   2. Renal artery stenosis ([employer])   3. OSA (obstructive sleep apnea)   4. Dyslipidemia      PLAN:  In order of problems  listed above:  HTN -she has been having resistant HTN but her PCP was concerned that some of this may be related to stress around the holidays -Her BP meds were titrated upward and is currently on amlodipine 10 mg daily, carvedilol 6.25 mg daily, chlorthalidone 25 mg daily, irbesartan 75 mg daily.  She had been on irbesartan as high as 300 mg daily but then blood pressure started to drop some after initiating beta-blocker therapy and she is now on irbesartan 75 mg daily -renal artery Dopplers were performed which demonstrated > 60% stenosis of the right renal artery and 1 to 59% stenosis of the left renal artery -24-hour urine for catecholamines and TSH were normal -She has has an abdominal CTA pending as ordered by Dr. Gwenlyn Found and if this appears to be significant renal artery stenosis he will proceed with PTA  -Home sleep study did show mild obstructive sleep apnea. -2D echo showed normal LV function and no LVH  2.  OSA -mild obstructive sleep apnea with an AHI of 10/hr.   -she has not started her Auto CPAP yet -She is going to start her auto CPAP and I will see her back 6 weeks after she starts  3.  Renal artery stenosis -renal artery Dopplers were performed which demonstrated > 60% stenosis of the right renal artery and 1 to 59% stenosis of the left renal artery -24-hour urine for catecholamines and TSH were normal -She has has an abdominal CTA pending as ordered by Dr. Gwenlyn Found and if this appears to be significant renal artery stenosis he will proceed with PTA  -  Continue statin therapy  4.  Hyperlipidemia -LDL goal less than 70 -Continue prescription drug management with pravastatin 20 mg daily -Repeat FLP and ALT   Time Spent: 20 minutes total time of encounter, including 15 minutes spent in face-to-face patient care on the date of this encounter. This time includes coordination of care and counseling regarding above mentioned problem list. Remainder of non-face-to-face time involved  reviewing chart documents/testing relevant to the patient encounter and documentation in the medical record. I have independently reviewed documentation from referring provider  Medication Adjustments/Labs and Tests Ordered: Current medicines are reviewed at length with the patient today.  Concerns regarding medicines are outlined above.  Medication changes, Labs and Tests ordered today are listed in the Patient Instructions below.  There are no Patient Instructions on file for this visit.   Signed, Fransico Him, MD  10/13/2022 9:11 AM    [address], [place], [place]  [address] Phone: [phone]; Fax: [phone]

## 2022-10-14 LAB — LIPID PANEL
Chol/HDL Ratio: 3 ratio (ref 0.0–4.4)
Cholesterol, Total: 176 mg/dL (ref 100–199)
HDL: 59 mg/dL (ref >39)
LDL Chol Calc (NIH): 97 mg/dL (ref 0–99)
Triglycerides: 109 mg/dL (ref 0–149)
VLDL Cholesterol Cal: 20 mg/dL (ref 5–40)

## 2022-10-14 LAB — ALT: ALT: 15 IU/L (ref 0–32)

## 2022-10-20 ENCOUNTER — Ambulatory Visit
Payer: Medicare HMO | Attending: Cardiovascular Disease | Admitting: Pharmacist Clinician (PhC)/ Clinical Pharmacy Specialist

## 2022-10-20 ENCOUNTER — Encounter: Payer: Self-pay | Admitting: Pharmacist Clinician (PhC)/ Clinical Pharmacy Specialist

## 2022-10-20 VITALS — BP 116/69 | HR 56

## 2022-10-20 DIAGNOSIS — I10 Essential (primary) hypertension: Secondary | ICD-10-CM | POA: Diagnosis not present

## 2022-10-20 DIAGNOSIS — E785 Hyperlipidemia, unspecified: Secondary | ICD-10-CM

## 2022-10-20 MED ORDER — PRAVASTATIN SODIUM 40 MG PO TABS: 40.0000 mg | ORAL_TABLET | Freq: Every evening | ORAL | 3 refills | Status: DC

## 2022-10-20 NOTE — Assessment & Plan Note (Signed)
Assessment: Patient with ASCVD not at LDL goal of < 70 Most recent LDL 97 on pravastatin 20 mg hs Has been compliant with this  Plan: Patient agreeable to increasing pravastatin to 40 mg qhs Repeat labs after:  6 weeks Lipid Liver function

## 2022-10-20 NOTE — Assessment & Plan Note (Signed)
Assessment: BP is controlled in office BP 116/69 mmHg; Dizziness has subsided since going to the 75 mg dose of irbesartan Tolerates other medications well without any side effects Denies SOB, palpitation, chest pain, headaches,or swelling Reiterated the importance of regular exercise and low salt diet   Plan:  Continue taking current medications Patient to keep record of BP readings several times each week, with heart rate and report to [place] at the next visit Patient to follow up with Dr. Gwenlyn Found in April for follow up  Labs ordered today:  none

## 2022-10-20 NOTE — Progress Notes (Signed)
Office Visit    Patient Name: Teresa Rangel Date of Encounter: 10/20/2022  Primary Care Provider:  Allwardt, Randa Evens, PA-C Primary Cardiologist:  Fransico Him, MD  Chief Complaint    Hypertension  Significant Past Medical History                    Allergies  Allergen Reactions   Molds & Smuts     Other reaction(s): cough   Tetracyclines & Related     Stomach cramps    History of Present Illness    Teresa Rangel is a 82 y.o. female patient of Dr Radford Pax, in the office today for blood pressure evaluation.  She is normally followed by Dr. Radford Pax, who saw her in December.  At that time her BP was 166/80 and fosinopril was switched to irbesartan 300 mg and amlodipine increased to 10 mg.  Renal and carotid studies were ordered as were plans metanephrines.  Renal study was positive for bilateral RAS (right > 60%, left 1-59%).  She was referred to Dr Gwenlyn Found for this and uncontrolled BP.  When she saw him in January, her pressure had improved to 116/50 and she was complaining of some dizziness.  He decreased irbesartan to 150 mg and asked that she maintain a BP log for 1 month and follow up with PharmD.  He also ordered abdominal CT.  Based on these, he will determine if stenting the renal artery is warranted.  Since then she called to report ongoing low BP readings and irbesartan was stopped.  Pressure increased accordingly and she was then put on irbesartan 75 mg daily  No home readings this past 2 weeks, husband home after back surgery, having some side effects to the anesthesia and she has been rather busy.  States dizziness has abated and she has been feeling well recently.    Blood Pressure Goal:  130/80  Current Medications:  irbesartan 75 mg qd, amlodipine 10 mg qd, carvedilol 625 mg bid, chlorthalidone 25 mg  Family Hx:  both parents had hypertension, both siblings as well, sister deceased from cancer, brother   Social Hx:      Tobacco:never  Alcohol: rare  wine  Caffeine: coffee in the mornings -1 cup Diet:  mix of eating at home and out.  Has cut out chips and being more conscious of salt intake; fish and chicken for protein, vegetables frozen or fresh (salad regularly)  Exercise: gets 5,000 steps but no regular exercise  Home BP readings: none recently, has been caring for husband with recent back surgery     Adherence Assessment  Do you ever forget to take your medication? '[]'$ Yes '[x]'$ No  Do you ever skip doses due to side effects? '[]'$ Yes '[x]'$ No  Do you have trouble affording your medicines? '[]'$ Yes '[x]'$ No  Are you ever unable to pick up your medication due to transportation difficulties? '[]'$ Yes '[x]'$ No  Do you ever stop taking your medications because you don't believe they are helping? '[]'$ Yes '[x]'$ No   Adherence strategy: 7 day pill holder  Barriers to obtaining medications: none  Accessory Clinical Findings    Lab Results  Component Value Date   CREATININE 0.74 09/08/2022   BUN 19 09/08/2022   NA 140 09/08/2022   K 4.4 09/08/2022   CL 100 09/08/2022   CO2 24 09/08/2022   Lab Results  Component Value Date   ALT 15 10/13/2022   AST 18 08/17/2022   ALKPHOS 61 08/17/2022   BILITOT 0.5 08/17/2022  No results found for: "HGBA1C"  Home Medications    Current Outpatient Medications  Medication Sig Dispense Refill   amLODipine (NORVASC) 10 MG tablet Take 1 tablet (10 mg total) by mouth daily. 90 tablet 3   carvedilol (COREG) 6.25 MG tablet Take 1 tablet (6.25 mg total) by mouth 2 (two) times daily. 180 tablet 3   chlorthalidone (HYGROTON) 25 MG tablet Take 1 tablet (25 mg total) by mouth daily. 90 tablet 3   cholecalciferol (VITAMIN D) 1000 UNITS tablet Take 2,000 Units by mouth daily.     FLUoxetine (PROZAC) 20 MG capsule Take 1 capsule (20 mg total) by mouth every morning. 30 capsule 2   fluticasone (FLONASE) 50 MCG/ACT nasal spray Place 2 sprays into both nostrils daily. 16 g 6   fluticasone furoate-vilanterol (BREO ELLIPTA)  200-25 MCG/ACT AEPB Inhale 1 puff into the lungs daily. 60 each 11   irbesartan (AVAPRO) 75 MG tablet Take 1 tablet (75 mg total) by mouth daily. 90 tablet 3   levothyroxine (SYNTHROID) 88 MCG tablet Take 1 tablet (88 mcg total) by mouth daily before breakfast. 90 tablet 3   pravastatin (PRAVACHOL) 40 MG tablet Take 1 tablet (40 mg total) by mouth every evening. 90 tablet 3   albuterol (VENTOLIN HFA) 108 (90 Base) MCG/ACT inhaler INHALE TWO PUFFS BY MOUTH EVERY 6 HOURS AS NEEDED FOR WHEEZING OR FOR SHORTNESS OF BREATH (Patient not taking: Reported on 10/20/2022) 18 g 1   No current facility-administered medications for this visit.         Assessment & Plan    Essential hypertension Assessment: BP is controlled in office BP 116/69 mmHg; Dizziness has subsided since going to the 75 mg dose of irbesartan Tolerates other medications well without any side effects Denies SOB, palpitation, chest pain, headaches,or swelling Reiterated the importance of regular exercise and low salt diet   Plan:  Continue taking current medications Patient to keep record of BP readings several times each week, with heart rate and report to [place] at the next visit Patient to follow up with Dr. Gwenlyn Found in April for follow up  Labs ordered today:  none   Dyslipidemia Assessment: Patient with ASCVD not at LDL goal of < 70 Most recent LDL 97 on pravastatin 20 mg hs Has been compliant with this  Plan: Patient agreeable to increasing pravastatin to 40 mg qhs Repeat labs after:  6 weeks Lipid Liver function   Tommy Medal PharmD CPP [facility]  [address] [place], [place] [address] [phone]

## 2022-10-20 NOTE — Patient Instructions (Signed)
Follow up appointment: with Dr. Gwenlyn Found in April  Take your BP meds as follows:  Continue with your current medications  Check your blood pressure at home 3-4 days each week and keep record of the readings.  Hypertension "High blood pressure"  Hypertension is often called "The Silent Killer." It rarely causes symptoms until it is extremely  high or has done damage to other organs in the body. For this reason, you should have your  blood pressure checked regularly by your physician. We will check your blood pressure  every time you see a provider at one of our offices.   Your blood pressure reading consists of two numbers. Ideally, blood pressure should be  below 120/80. The first ("top") number is called the systolic pressure. It measures the  pressure in your arteries as your heart beats. The second ("bottom") number is called the diastolic pressure. It measures the pressure in your arteries as the heart relaxes between beats.  The benefits of getting your blood pressure under control are enormous. A 10-point  reduction in systolic blood pressure can reduce your risk of stroke by 27% and heart failure by 28%  Your blood pressure goal is < 130/80  To check your pressure at home you will need to:  1. Sit up in a chair, with feet flat on the floor and back supported. Do not cross your ankles or legs. 2. Rest your left arm so that the cuff is about heart level. If the cuff goes on your upper arm,  then just relax the arm on the table, arm of the chair or your lap. If you have a wrist cuff, we  suggest relaxing your wrist against your chest (think of it as Pledging the Flag with the  wrong arm).  3. Place the cuff snugly around your arm, about 1 inch above the crook of your elbow. The  cords should be inside the groove of your elbow.  4. Sit quietly, with the cuff in place, for about 5 minutes. After that 5 minutes press the power  button to start a reading. 5. Do not talk or move  while the reading is taking place.  6. Record your readings on a sheet of paper. Although most cuffs have a memory, it is often  easier to see a pattern developing when the numbers are all in front of you.  7. You can repeat the reading after 1-3 minutes if it is recommended  Make sure your bladder is empty and you have not had caffeine or tobacco within the last 30 min  Always bring your blood pressure log with you to your appointments. If you have not brought your monitor in to be double checked for accuracy, please bring it to your next appointment.  You can find a list of quality blood pressure cuffs at validatebp.org

## 2022-10-22 ENCOUNTER — Telehealth: Payer: Self-pay | Admitting: Cardiology

## 2022-10-22 DIAGNOSIS — E785 Hyperlipidemia, unspecified: Secondary | ICD-10-CM

## 2022-10-22 NOTE — Telephone Encounter (Signed)
Patient is returning RN's call for lab results.

## 2022-10-27 NOTE — Telephone Encounter (Signed)
Patient calling back for resulted FLP and ALT. Patient with elevated LDL, Dr. Radford Pax advising to increase pravastatin to 40  mg. Patient states she was seen at lipid clinic on 10/20/22 and this med was ordered for her at that time. Orders placed for follow up labs in 6 weeks.

## 2022-10-27 NOTE — Addendum Note (Signed)
Addended by: Joni Reining on: 10/27/2022 04:13 PM   Modules accepted: Orders

## 2022-10-27 NOTE — Telephone Encounter (Signed)
Please see full lab result for 10/13/22.

## 2022-10-28 ENCOUNTER — Other Ambulatory Visit: Payer: Self-pay | Admitting: Pulmonary Disease

## 2022-11-05 ENCOUNTER — Encounter: Payer: Self-pay | Admitting: Cardiovascular Disease

## 2022-11-11 ENCOUNTER — Ambulatory Visit
Admission: RE | Admit: 2022-11-11 | Discharge: 2022-11-11 | Disposition: A | Discharge status: Home / Self Care | Payer: Medicare HMO | Source: Ambulatory Visit | Attending: Cardiovascular Disease | Admitting: Cardiovascular Disease

## 2022-11-11 DIAGNOSIS — S22080A Wedge compression fracture of T11-T12 vertebra, initial encounter for closed fracture: Secondary | ICD-10-CM | POA: Diagnosis not present

## 2022-11-11 DIAGNOSIS — M47816 Spondylosis without myelopathy or radiculopathy, lumbar region: Secondary | ICD-10-CM | POA: Diagnosis not present

## 2022-11-11 DIAGNOSIS — I701 Atherosclerosis of renal artery: Secondary | ICD-10-CM | POA: Diagnosis not present

## 2022-11-11 DIAGNOSIS — K449 Diaphragmatic hernia without obstruction or gangrene: Secondary | ICD-10-CM | POA: Diagnosis not present

## 2022-11-11 MED ORDER — IOPAMIDOL (ISOVUE-370) INJECTION 76%
100.0000 mL | Freq: Once | INTRAVENOUS | Status: AC | PRN
  Administered 2022-11-11: 100 mL via INTRAVENOUS

## 2022-11-15 DIAGNOSIS — H353221 Exudative age-related macular degeneration, left eye, with active choroidal neovascularization: Secondary | ICD-10-CM | POA: Diagnosis not present

## 2022-11-19 ENCOUNTER — Other Ambulatory Visit: Payer: Self-pay | Admitting: Physician Assistant

## 2022-11-20 ENCOUNTER — Telehealth: Payer: Self-pay | Admitting: *Deleted

## 2022-11-20 NOTE — Telephone Encounter (Signed)
Late Entry March 4th no contact made Received: 5 days ago Sheran Lawless, Hermenia Fiscal, [place]; Lauralee Evener, CMA; [place] Avers ladies, We've not been able to make contact with this patient.  We've left messages and texted but no return call.  Please let us know if we can do anything else. Thank you! Melissa

## 2022-11-29 ENCOUNTER — Other Ambulatory Visit: Payer: Self-pay | Admitting: Pharmacist Clinician (PhC)/ Clinical Pharmacy Specialist

## 2022-11-29 DIAGNOSIS — E785 Hyperlipidemia, unspecified: Secondary | ICD-10-CM

## 2022-11-29 DIAGNOSIS — I10 Essential (primary) hypertension: Secondary | ICD-10-CM

## 2022-12-10 ENCOUNTER — Ambulatory Visit: Payer: Medicare HMO | Attending: Cardiology

## 2022-12-10 DIAGNOSIS — I10 Essential (primary) hypertension: Secondary | ICD-10-CM | POA: Diagnosis not present

## 2022-12-10 DIAGNOSIS — E785 Hyperlipidemia, unspecified: Secondary | ICD-10-CM | POA: Diagnosis not present

## 2022-12-11 LAB — LIPID PANEL
Chol/HDL Ratio: 2.6 ratio (ref 0.0–4.4)
Cholesterol, Total: 167 mg/dL (ref 100–199)
HDL: 65 mg/dL (ref >39)
LDL Chol Calc (NIH): 87 mg/dL (ref 0–99)
Triglycerides: 83 mg/dL (ref 0–149)
VLDL Cholesterol Cal: 15 mg/dL (ref 5–40)

## 2022-12-11 LAB — BASIC METABOLIC PANEL
BUN/Creatinine Ratio: 26 (ref 12–28)
BUN: 21 mg/dL (ref 8–27)
CO2: 27 mmol/L (ref 20–29)
Calcium: 9.4 mg/dL (ref 8.7–10.3)
Chloride: 101 mmol/L (ref 96–106)
Creatinine, Ser: 0.82 mg/dL (ref 0.57–1.00)
Glucose: 102 mg/dL — ABNORMAL HIGH (ref 70–99)
Potassium: 4 mmol/L (ref 3.5–5.2)
Sodium: 141 mmol/L (ref 134–144)
eGFR: 71 mL/min/{1.73_m2} (ref >59)

## 2022-12-14 ENCOUNTER — Encounter: Payer: Self-pay | Admitting: Cardiology

## 2022-12-14 ENCOUNTER — Telehealth: Payer: Self-pay

## 2022-12-14 DIAGNOSIS — Z79899 Other long term (current) drug therapy: Secondary | ICD-10-CM

## 2022-12-14 DIAGNOSIS — E785 Hyperlipidemia, unspecified: Secondary | ICD-10-CM

## 2022-12-14 MED ORDER — EZETIMIBE 10 MG PO TABS: 10.0000 mg | ORAL_TABLET | Freq: Every day | ORAL | 3 refills | Status: DC

## 2022-12-14 NOTE — Telephone Encounter (Signed)
Spoke with patient and reviewed labs. Patient states she has been on pravastatin 40 mg daily since early February and she has not missed any doses. Reviewed Dr. Theodosia Blender recommendations, patient agrees to start zetia and to repeat labs. Orders placed, lab appt scheduled.

## 2022-12-14 NOTE — Telephone Encounter (Signed)
-----   Message from Sueanne Margarita, MD sent at 12/12/2022  6:22 PM EDT ----- Please verify If she increased pravastatin to 40mg  daily and if yes and she did not miss any doses then need to add Zetia 10mg  daily and repeat FLP and ALT in 8 weeks -]

## 2022-12-16 DIAGNOSIS — L718 Other rosacea: Secondary | ICD-10-CM | POA: Diagnosis not present

## 2022-12-16 DIAGNOSIS — L814 Other melanin hyperpigmentation: Secondary | ICD-10-CM | POA: Diagnosis not present

## 2022-12-16 DIAGNOSIS — L853 Xerosis cutis: Secondary | ICD-10-CM | POA: Diagnosis not present

## 2022-12-16 DIAGNOSIS — L308 Other specified dermatitis: Secondary | ICD-10-CM | POA: Diagnosis not present

## 2022-12-16 DIAGNOSIS — L821 Other seborrheic keratosis: Secondary | ICD-10-CM | POA: Diagnosis not present

## 2022-12-16 DIAGNOSIS — L720 Epidermal cyst: Secondary | ICD-10-CM | POA: Diagnosis not present

## 2022-12-16 DIAGNOSIS — D1801 Hemangioma of skin and subcutaneous tissue: Secondary | ICD-10-CM | POA: Diagnosis not present

## 2022-12-16 DIAGNOSIS — D225 Melanocytic nevi of trunk: Secondary | ICD-10-CM | POA: Diagnosis not present

## 2022-12-27 DIAGNOSIS — H353221 Exudative age-related macular degeneration, left eye, with active choroidal neovascularization: Secondary | ICD-10-CM | POA: Diagnosis not present

## 2023-01-04 ENCOUNTER — Encounter: Payer: Self-pay | Admitting: Cardiovascular Disease

## 2023-01-04 ENCOUNTER — Ambulatory Visit: Payer: Medicare HMO | Attending: Cardiovascular Disease | Admitting: Cardiovascular Disease

## 2023-01-04 VITALS — BP 116/58 | HR 61 | Ht 64.0 in | Wt 157.0 lb

## 2023-01-04 DIAGNOSIS — I129 Hypertensive chronic kidney disease with stage 1 through stage 4 chronic kidney disease, or unspecified chronic kidney disease: Secondary | ICD-10-CM | POA: Diagnosis not present

## 2023-01-04 NOTE — Patient Instructions (Signed)
Medication Instructions:  Your physician recommends that you continue on your current medications as directed. Please refer to the Current Medication list given to you today.  *If you need a refill on your cardiac medications before your next appointment, please call your pharmacy*   Testing/Procedures: Your physician has requested that you have a renal artery duplex. During this test, an ultrasound is used to evaluate blood flow to the kidneys. Take your medications as you usually do. This will take place at [address] [facility], [address].  No food after 11PM the night before.  Water is OK. (Don't drink liquids if you have been instructed not to for ANOTHER test). Avoid foods that produce bowel gas, for 24 hours prior to exam (see below). No breakfast, no chewing gum, no smoking or carbonated beverages. Patient may take morning medications with water. Come in for test at least 15 minutes early to register.  To be done in January 2025.   Follow-Up: At [facility], you and your health needs are our priority.  As part of our continuing mission to provide you with exceptional heart care, we have created designated Provider Care Teams.  These Care Teams include your primary Cardiologist (physician) and Advanced Practice Providers (APPs -  Physician Assistants and Nurse Practitioners) who all work together to provide you with the care you need, when you need it.  We recommend signing up for the patient portal called "MyChart".  Sign up information is provided on this After Visit Summary.  MyChart is used to connect with patients for Virtual Visits (Telemedicine).  Patients are able to view lab/test results, encounter notes, upcoming appointments, etc.  Non-urgent messages can be sent to your provider as well.   To learn more about what you can do with MyChart, go to [email or web address].    Your next appointment:   12 month(s)  Provider:   Nanetta Batty, MD

## 2023-01-04 NOTE — Progress Notes (Signed)
Teresa Rangel returns today for follow-up of her noninvasive testing evaluation of renovascular hypertension.  She did have a CTA performed of her abdominal vasculature 11/11/2022 that showed an most of 50% right renal artery stenosis which corresponds to her renal Doppler study performed 10/10/2021 that showed a right renal aortic ratio of 3.5 which corresponds to 50% stenosis.  Her blood pressure is under excellent control on her current medications.  I do not see an indication to perform renal revascularization at this time.  Will continue to follow her noninvasively on annual basis.  Runell Gess, M.D., FACP, [facility], [facility] [address] [place], [place]  [address]  [phone] 01/04/2023 9:19 AM

## 2023-01-21 DIAGNOSIS — N3001 Acute cystitis with hematuria: Secondary | ICD-10-CM | POA: Diagnosis not present

## 2023-02-02 DIAGNOSIS — Z0100 Encounter for examination of eyes and vision without abnormal findings: Secondary | ICD-10-CM | POA: Diagnosis not present

## 2023-02-08 ENCOUNTER — Ambulatory Visit: Payer: Medicare HMO | Attending: Cardiology

## 2023-02-08 DIAGNOSIS — Z79899 Other long term (current) drug therapy: Secondary | ICD-10-CM | POA: Diagnosis not present

## 2023-02-08 DIAGNOSIS — E785 Hyperlipidemia, unspecified: Secondary | ICD-10-CM

## 2023-02-09 ENCOUNTER — Telehealth: Payer: Self-pay

## 2023-02-09 DIAGNOSIS — E785 Hyperlipidemia, unspecified: Secondary | ICD-10-CM

## 2023-02-09 DIAGNOSIS — Z79899 Other long term (current) drug therapy: Secondary | ICD-10-CM

## 2023-02-09 LAB — LIPID PANEL
Chol/HDL Ratio: 2.4 ratio (ref 0.0–4.4)
Cholesterol, Total: 166 mg/dL (ref 100–199)
HDL: 69 mg/dL (ref >39)
LDL Chol Calc (NIH): 84 mg/dL (ref 0–99)
Triglycerides: 68 mg/dL (ref 0–149)
VLDL Cholesterol Cal: 13 mg/dL (ref 5–40)

## 2023-02-09 LAB — ALT: ALT: 14 IU/L (ref 0–32)

## 2023-02-09 MED ORDER — PRAVASTATIN SODIUM 80 MG PO TABS: 80.0000 mg | ORAL_TABLET | Freq: Every evening | ORAL | 3 refills | Status: DC

## 2023-02-09 NOTE — Telephone Encounter (Signed)
-----   Message from Meriam Sprague, MD sent at 02/09/2023 11:26 AM EDT ----- LDL improved but still above goal. Is she able to go up to prava 80mg  daily or does this cause too many side effects? If so, we can have her see lipid clinic to discuss nonstatin alternatives to help get her LDL to goal.   Liver function is normal

## 2023-02-09 NOTE — Telephone Encounter (Signed)
Called patient to discuss improved LDL but still not at goal, advised that Dr. Shari Prows recommends increasing pravastatin to 80 mg. Patient verbalizes understanding and agrees to plan. Patient states she also has not been taking zetia as it upset her stomach, zetia removed from med list. Scheduled repeat labs.

## 2023-02-11 DIAGNOSIS — H353221 Exudative age-related macular degeneration, left eye, with active choroidal neovascularization: Secondary | ICD-10-CM | POA: Diagnosis not present

## 2023-02-11 DIAGNOSIS — H35372 Puckering of macula, left eye: Secondary | ICD-10-CM | POA: Diagnosis not present

## 2023-02-11 DIAGNOSIS — H35033 Hypertensive retinopathy, bilateral: Secondary | ICD-10-CM | POA: Diagnosis not present

## 2023-02-11 DIAGNOSIS — H353112 Nonexudative age-related macular degeneration, right eye, intermediate dry stage: Secondary | ICD-10-CM | POA: Diagnosis not present

## 2023-02-11 DIAGNOSIS — H43813 Vitreous degeneration, bilateral: Secondary | ICD-10-CM | POA: Diagnosis not present

## 2023-02-14 ENCOUNTER — Other Ambulatory Visit: Payer: Self-pay | Admitting: Physician Assistant

## 2023-03-01 ENCOUNTER — Other Ambulatory Visit: Payer: Self-pay | Admitting: Physician Assistant

## 2023-03-01 DIAGNOSIS — Z1231 Encounter for screening mammogram for malignant neoplasm of breast: Secondary | ICD-10-CM

## 2023-03-21 DIAGNOSIS — H353221 Exudative age-related macular degeneration, left eye, with active choroidal neovascularization: Secondary | ICD-10-CM | POA: Diagnosis not present

## 2023-04-01 DIAGNOSIS — N390 Urinary tract infection, site not specified: Secondary | ICD-10-CM | POA: Diagnosis not present

## 2023-04-05 ENCOUNTER — Telehealth: Payer: Self-pay | Admitting: *Deleted

## 2023-04-05 NOTE — Telephone Encounter (Signed)
I attempted to contact patient by telephone but was unsuccessful. According to the patient's chart they are due for follow up  with [facility]. I have left a HIPAA compliant message advising the patient to contact [facility] at [phone]. I will continue to follow up with the patient to make sure this appointment is scheduled.

## 2023-04-08 NOTE — Telephone Encounter (Signed)
I connected with Teresa Rangel on 7/26 at 1440 by telephone and verified that I am speaking with the correct person using two identifiers. According to the patient's chart they are due for follow up  with [facility]. Pt scheduled. There are no transportation issues at this time. Nothing further was needed at the end of our conversation.

## 2023-04-11 ENCOUNTER — Ambulatory Visit: Payer: Medicare HMO | Attending: Cardiology

## 2023-04-11 DIAGNOSIS — Z79899 Other long term (current) drug therapy: Secondary | ICD-10-CM | POA: Diagnosis not present

## 2023-04-12 ENCOUNTER — Telehealth: Payer: Self-pay

## 2023-04-12 ENCOUNTER — Ambulatory Visit (INDEPENDENT_AMBULATORY_CARE_PROVIDER_SITE_OTHER): Payer: Medicare HMO | Admitting: Physician Assistant

## 2023-04-12 ENCOUNTER — Encounter: Payer: Self-pay | Admitting: Physician Assistant

## 2023-04-12 VITALS — BP 120/60 | HR 50 | Temp 97.5°F | Wt 157.2 lb

## 2023-04-12 DIAGNOSIS — F32A Depression, unspecified: Secondary | ICD-10-CM | POA: Diagnosis not present

## 2023-04-12 DIAGNOSIS — E039 Hypothyroidism, unspecified: Secondary | ICD-10-CM

## 2023-04-12 DIAGNOSIS — F419 Anxiety disorder, unspecified: Secondary | ICD-10-CM | POA: Diagnosis not present

## 2023-04-12 DIAGNOSIS — E785 Hyperlipidemia, unspecified: Secondary | ICD-10-CM

## 2023-04-12 DIAGNOSIS — Z79899 Other long term (current) drug therapy: Secondary | ICD-10-CM

## 2023-04-12 MED ORDER — FLUOXETINE HCL 20 MG PO CAPS: 20.0000 mg | ORAL_CAPSULE | Freq: Every morning | ORAL | 3 refills | Status: DC

## 2023-04-12 NOTE — Addendum Note (Signed)
Addended by: Lorn Junes on: 04/12/2023 11:14 AM   Modules accepted: Orders

## 2023-04-12 NOTE — Assessment & Plan Note (Signed)
Stable with fluoxetine 20 mg daily.  Refilled this medication today.  She had severe panic in December 2023 secondary to overwhelmed with husband's health and piano programs for churches.  Plan to follow-up with her after Thanksgiving this year just to check in and make sure everything is still going well at that time.  Glad to see her doing well today.

## 2023-04-12 NOTE — Progress Notes (Signed)
Subjective:    Patient ID: Naquana Veksler, female    DOB: [birth year], 82 y.o.   MRN: [id number]  Chief Complaint  Patient presents with   Follow-up    Thyroid panel     HPI Patient is in today for 6 month f/up chronic conditions. See A/P. Patient reports that overall she is doing much better than she was in December.  She has been following up with a cardiologist regularly.  Her husband has been doing treatments in [place] for his neuropathy and is doing much better in his health.  She still worries about him, but is thankful his quality of life is much better at this time.  Past Medical History:  Diagnosis Date   History of stomach ulcers 1980   Hyperlipidemia    Hypertension    Hypothyroidism    Macular degeneration of left eye    OSA (obstructive sleep apnea)    mild obstructive sleep apnea with an AHI of 10/hr.  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   Benign     Past Surgical History:  Procedure Laterality Date   APPENDECTOMY  1954   COLONOSCOPY     LAPAROSCOPY  1982   THYROIDECTOMY  2007   TONSILLECTOMY  1949    Family History  Problem Relation Age of Onset   Breast cancer Maternal Aunt 57    Social History   Tobacco Use   Smoking status: Never   Smokeless tobacco: Never  Vaping Use   Vaping status: Never Used  Substance Use Topics   Alcohol use: Not Currently    Alcohol/week: 1.0 standard drink of alcohol    Types: 1 Glasses of wine per week    Comment: occassional   Drug use: No     Allergies  Allergen Reactions   Molds & Smuts     Other reaction(s): cough   Tetracyclines & Related     Stomach cramps    Review of Systems NEGATIVE UNLESS OTHERWISE INDICATED IN HPI      Objective:     BP 120/60   Pulse (!) 50   Temp (!) 97.5 F (36.4 C) (Temporal)   Wt 157 lb 3.2 oz (71.3 kg)   LMP  (LMP Unknown)   SpO2 96%    BMI 26.98 kg/m   Wt Readings from Last 3 Encounters:  04/12/23 157 lb 3.2 oz (71.3 kg)  01/04/23 157 lb (71.2 kg)  10/13/22 161 lb 6.4 oz (73.2 kg)    BP Readings from Last 3 Encounters:  04/12/23 120/60  01/04/23 (!) 116/58  10/20/22 116/69     Physical Exam Vitals and nursing note reviewed.  Constitutional:      General: She is not in acute distress.    Appearance: Normal appearance. She is not ill-appearing.  Eyes:     Extraocular Movements: Extraocular movements intact.     Conjunctiva/sclera: Conjunctivae normal.     Pupils: Pupils are equal, round, and reactive to light.  Cardiovascular:     Rate and Rhythm: Bradycardia present.  Pulmonary:     Effort: Pulmonary effort is normal.     Breath sounds: Normal breath sounds.  Neurological:     General: No focal deficit present.     Mental Status: She is alert and oriented to person, place, and time.  Psychiatric:  Mood and Affect: Mood normal.        Assessment & Plan:  Hypothyroidism, unspecified type Assessment & Plan: Chronic, stable.  Currently taking levothyroxine 88 mcg daily.  Recheck panel today and adjust medication as needed.  Orders: -     TSH -     T4, free  Anxiety and depression Assessment & Plan: Stable with fluoxetine 20 mg daily.  Refilled this medication today.  She had severe panic in December 2023 secondary to overwhelmed with husband's health and piano programs for churches.  Plan to follow-up with her after Thanksgiving this year just to check in and make sure everything is still going well at that time.  Glad to see her doing well today.  Orders: -     FLUoxetine HCl; Take 1 capsule (20 mg total) by mouth every morning.  Dispense: 90 capsule; Refill: 3        Return in about 4 months (around 08/13/2023) for recheck/follow-up.   Santos Hardwick M Falan Hensler, PA-C

## 2023-04-12 NOTE — Assessment & Plan Note (Signed)
Chronic, stable.  Currently taking levothyroxine 88 mcg daily.  Recheck panel today and adjust medication as needed.

## 2023-04-12 NOTE — Telephone Encounter (Signed)
-----   Message from Armanda Magic sent at 04/12/2023  8:18 AM EDT ----- Lipids still not at goal.  Please forward to lipid clinic for further recommendations.

## 2023-04-12 NOTE — Telephone Encounter (Signed)
Called patient to discuss lipid panel results. Per Dr. Mayford Knife, lipids still not at goal and lipid clinic is recommended. Patient agrees to plan, order placed.

## 2023-04-14 ENCOUNTER — Ambulatory Visit: Payer: Medicare HMO

## 2023-04-14 ENCOUNTER — Ambulatory Visit: Payer: Medicare HMO | Admitting: Physician Assistant

## 2023-04-15 ENCOUNTER — Other Ambulatory Visit: Payer: Medicare HMO

## 2023-04-15 DIAGNOSIS — E039 Hypothyroidism, unspecified: Secondary | ICD-10-CM

## 2023-04-15 LAB — TSH: TSH: 1.66 u[IU]/mL (ref 0.35–5.50)

## 2023-04-15 LAB — T4, FREE: Free T4: 1.28 ng/dL (ref 0.60–1.60)

## 2023-04-18 ENCOUNTER — Encounter: Payer: Self-pay | Admitting: Physician Assistant

## 2023-04-27 ENCOUNTER — Ambulatory Visit
Admission: RE | Admit: 2023-04-27 | Discharge: 2023-04-27 | Disposition: A | Discharge status: Home / Self Care | Payer: Medicare HMO | Source: Ambulatory Visit | Attending: Physician Assistant | Admitting: Physician Assistant

## 2023-04-27 DIAGNOSIS — Z1231 Encounter for screening mammogram for malignant neoplasm of breast: Secondary | ICD-10-CM | POA: Diagnosis not present

## 2023-04-28 ENCOUNTER — Encounter (INDEPENDENT_AMBULATORY_CARE_PROVIDER_SITE_OTHER): Payer: Self-pay

## 2023-05-02 DIAGNOSIS — H353221 Exudative age-related macular degeneration, left eye, with active choroidal neovascularization: Secondary | ICD-10-CM | POA: Diagnosis not present

## 2023-05-04 ENCOUNTER — Encounter: Payer: Self-pay | Admitting: Pharmacist Clinician (PhC)/ Clinical Pharmacy Specialist

## 2023-05-04 ENCOUNTER — Ambulatory Visit
Payer: Medicare HMO | Attending: Cardiovascular Disease | Admitting: Pharmacist Clinician (PhC)/ Clinical Pharmacy Specialist

## 2023-05-04 DIAGNOSIS — E785 Hyperlipidemia, unspecified: Secondary | ICD-10-CM

## 2023-05-04 NOTE — Progress Notes (Signed)
Office Visit    Patient Name: Teresa Rangel Date of Encounter: 05/04/2023  Primary Care Provider:  Allwardt, Crist Infante, PA-C Primary Cardiologist:  Armanda Magic, MD  Chief Complaint    Hyperlipidemia   Significant Past Medical History   HTN Currently well controlled on amlodipine 10, carvedilol 6.25, chlorthalidone 25  RAS Bilateral, 50% - R, 40% - L , followed by Dr. Allyson Sabal  hypothyroidism TSH on levothyroxine 88 mcg           Allergies  Allergen Reactions   Molds & Smuts     Other reaction(s): cough   Tetracyclines & Related     Stomach cramps    History of Present Illness    Teresa Rangel is a 82 y.o. female patient of Dr Mayford Knife, in the office today to discuss options for cholesterol management.  I saw her earlier this year for hypertension management.  She had cholesterol labs drawn in May that showed her LDL at 84.  Because of the bilateral RAS, her LDL goal is < 70.   She was asked to increase pravastatin from 40 mg to 80 mg daily.  Repeat labs drawn at the end of July showed LDL still at 81.  She was asked to see [facility] for lipid management.   Today she comes in and admits that she didn't like the idea of increasing pravastatin to 80 mg back in May, so had actually been alternating 40 mg and 80 mg every other day.  Since the July results were posted she has increased the dose to 80 mg daily.  She takes this nightly at bedtime.  She also notes that she quit taking ezetimibe some time ago, as it caused her to have diarrhea.  She has also never tried another statin.    Insurance Carrier:  [employer] -   [employer] $47/month, $139 in coverage gap  LDL Cholesterol goal:  LDL < 70  Current Medications:   pravastatin 80 mg  Family Hx:  both parents had hypertension, both siblings as well, sister deceased from cancer, brother    Social Hx:                 Tobacco:never             Alcohol: rare wine             Caffeine: coffee in the mornings -1  cup  Diet:  mix of eating at home and out.  Has cut out chips and being more conscious of salt intake; fish and chicken for protein, vegetables frozen or fresh (salad regularly)   Exercise: gets 5,000 steps but no regular exercise    Accessory Clinical Findings   Lab Results  Component Value Date   CHOL 154 04/11/2023   HDL 55 04/11/2023   LDLCALC 81 04/11/2023   LDLDIRECT 133.1 12/07/2012   TRIG 97 04/11/2023   CHOLHDL 2.8 04/11/2023    No results found for: "LIPOA"  Lab Results  Component Value Date   ALT 16 04/11/2023   AST 18 08/17/2022   ALKPHOS 61 08/17/2022   BILITOT 0.5 08/17/2022   Lab Results  Component Value Date   CREATININE 0.82 12/10/2022   BUN 21 12/10/2022   NA 141 12/10/2022   K 4.0 12/10/2022   CL 101 12/10/2022   CO2 27 12/10/2022   No results found for: "HGBA1C"  Home Medications    Current Outpatient Medications  Medication Sig Dispense Refill   Multiple Vitamins-Minerals (PRESERVISION  AREDS 2 PO) Take 1 tablet by mouth daily.     Potassium 99 MG TABS Take 1 tablet by mouth daily.     TURMERIC PO Take 1 tablet by mouth daily.     albuterol (VENTOLIN HFA) 108 (90 Base) MCG/ACT inhaler INHALE TWO PUFFS BY MOUTH EVERY 6 HOURS AS NEEDED FOR WHEEZING OR FOR SHORTNESS OF BREATH 18 g 1   amLODipine (NORVASC) 10 MG tablet Take 1 tablet (10 mg total) by mouth daily. 90 tablet 3   BREO ELLIPTA 200-25 MCG/ACT AEPB INHALE ONE PUFF BY MOUTH DAILY 60 each 11   carvedilol (COREG) 6.25 MG tablet Take 1 tablet (6.25 mg total) by mouth 2 (two) times daily. 180 tablet 3   chlorthalidone (HYGROTON) 25 MG tablet Take 1 tablet (25 mg total) by mouth daily. 90 tablet 3   FLUoxetine (PROZAC) 20 MG capsule Take 1 capsule (20 mg total) by mouth every morning. 90 capsule 3   fluticasone (FLONASE) 50 MCG/ACT nasal spray Place 2 sprays into both nostrils daily. 16 g 6   levothyroxine (SYNTHROID) 88 MCG tablet Take 1 tablet (88 mcg total) by mouth daily before breakfast. 90  tablet 3   pravastatin (PRAVACHOL) 80 MG tablet Take 1 tablet (80 mg total) by mouth every evening. 90 tablet 3   No current facility-administered medications for this visit.     Assessment & Plan    Dyslipidemia Assessment: Patient with ASCVD not at LDL goal of < 70 Most recent LDL 81 on 04/11/23 Has been compliant with moderate intensity statin : pravastatin 40 mg every other day/80 mg every other day   Has not challenged with any other statin drugs Did not tolerate ezetimibe 2/2 diarrhea  Plan: Patient recently increased pravastatin to 80 mg daily Repeat labs after:  2 months total on higher dose Lipid Liver function If not at LDL goal at that time will consider switching to more potent statin   Phillips Hay, PharmD CPP [facility] [address]  [place], [place] [address] [phone]  05/04/2023, 11:18 AM

## 2023-05-04 NOTE — Patient Instructions (Signed)
Your Results:             Your most recent labs Goal  Total Cholesterol 154 < 200  Triglycerides 97 < 150  HDL (happy/good cholesterol) 55 > 40  LDL (lousy/bad cholesterol 81 < 70   Medication changes:  Continue with pravastatin 80 mg each night.  If you want to re-start the zetia (ezetimibe), lets do that after we get the next cholesterol lab results.    Lab orders:  We want to repeat labs in another month.  We will send you a lab order to remind you once we get closer to that time.     Thank you for choosing [facility]

## 2023-05-04 NOTE — Assessment & Plan Note (Signed)
Assessment: Patient with ASCVD not at LDL goal of < 70 Most recent LDL 81 on 04/11/23 Has been compliant with moderate intensity statin : pravastatin 40 mg every other day/80 mg every other day   Has not challenged with any other statin drugs Did not tolerate ezetimibe 2/2 diarrhea  Plan: Patient recently increased pravastatin to 80 mg daily Repeat labs after:  2 months total on higher dose Lipid Liver function If not at LDL goal at that time will consider switching to more potent statin

## 2023-05-12 ENCOUNTER — Ambulatory Visit: Payer: Medicare HMO | Admitting: Podiatry

## 2023-05-12 ENCOUNTER — Ambulatory Visit (INDEPENDENT_AMBULATORY_CARE_PROVIDER_SITE_OTHER): Payer: Medicare HMO

## 2023-05-12 ENCOUNTER — Encounter: Payer: Self-pay | Admitting: Podiatry

## 2023-05-12 DIAGNOSIS — S92504A Nondisplaced unspecified fracture of right lesser toe(s), initial encounter for closed fracture: Secondary | ICD-10-CM

## 2023-05-12 DIAGNOSIS — M778 Other enthesopathies, not elsewhere classified: Secondary | ICD-10-CM | POA: Diagnosis not present

## 2023-05-12 NOTE — Progress Notes (Signed)
She presents today chief complaint of a painful second toe of the right foot.  States that over the weekend she had tripped and fallen going to her daughter's house for dinner.  She states that she injured both knees her elbows and her right foot which she had had surgery previously.  Objective: Vitals are stable she is alert and oriented x 3 she has edema and ecchymosis no erythema cellulitis drainage or odor no open lesions or wounds to the foot.  Tenderness on end range of motion of the second toe at the metatarsal phalangeal joint.  Radiographs taken today demonstrate what appears to be a nondisplaced fracture intra-articular at the proximal lateral aspect of the proximal phalanx articular surface.  Internal fixation to the second metatarsal head is intact.  Assessment: Fracture second digit.  None comminuted noncomplicated.  Plan: I encouraged the use of a Darco shoe which she already has at home or stiff soled shoes.

## 2023-05-22 ENCOUNTER — Encounter: Payer: Self-pay | Admitting: Physician Assistant

## 2023-06-06 ENCOUNTER — Other Ambulatory Visit: Payer: Self-pay

## 2023-06-06 DIAGNOSIS — E785 Hyperlipidemia, unspecified: Secondary | ICD-10-CM

## 2023-06-06 MED ORDER — PRAVASTATIN SODIUM 80 MG PO TABS: 80.0000 mg | ORAL_TABLET | Freq: Every evening | ORAL | 1 refills | Status: DC

## 2023-06-06 NOTE — Telephone Encounter (Signed)
Pt's medication was sent to pt's pharmacy as requested. Confirmation received.  °

## 2023-06-10 ENCOUNTER — Telehealth: Payer: Self-pay | Admitting: Pharmacist Clinician (PhC)/ Clinical Pharmacy Specialist

## 2023-06-10 DIAGNOSIS — E785 Hyperlipidemia, unspecified: Secondary | ICD-10-CM

## 2023-06-10 NOTE — Telephone Encounter (Signed)
Labs ordered, patient notified

## 2023-06-13 ENCOUNTER — Encounter: Payer: Self-pay | Admitting: Physician Assistant

## 2023-06-20 ENCOUNTER — Ambulatory Visit: Payer: Medicare HMO | Attending: Cardiology

## 2023-06-20 DIAGNOSIS — E785 Hyperlipidemia, unspecified: Secondary | ICD-10-CM | POA: Diagnosis not present

## 2023-06-23 ENCOUNTER — Ambulatory Visit: Payer: Medicare HMO

## 2023-06-23 VITALS — Wt 157.0 lb

## 2023-06-23 DIAGNOSIS — Z0000 Encounter for general adult medical examination without abnormal findings: Secondary | ICD-10-CM | POA: Diagnosis not present

## 2023-06-23 MED ORDER — ROSUVASTATIN CALCIUM 20 MG PO TABS: 20.0000 mg | ORAL_TABLET | Freq: Every day | ORAL | 3 refills | Status: DC

## 2023-06-23 NOTE — Patient Instructions (Signed)
Teresa Rangel , Thank you for taking time to come for your Medicare Wellness Visit. I appreciate your ongoing commitment to your health goals. Please review the following plan we discussed and let me know if I can assist you in the future.   Referrals/Orders/Follow-Ups/Clinician Recommendations: Aim for 30 minutes of exercise or brisk walking, 6-8 glasses of water, and 5 servings of fruits and vegetables each day.   This is a list of the screening recommended for you and due dates:  Health Maintenance  Topic Date Due   COVID-19 Vaccine (3 - Pfizer risk series) 12/14/2019   Zoster (Shingles) Vaccine (2 of 2) 06/07/2022   Flu Shot  12/12/2023*   Mammogram  04/26/2024   Medicare Annual Wellness Visit  06/22/2024   DTaP/Tdap/Td vaccine (2 - Td or Tdap) 05/22/2033   Pneumonia Vaccine  Completed   DEXA scan (bone density measurement)  Completed   HPV Vaccine  Aged Out  *Topic was postponed. The date shown is not the original due date.    Advanced directives: (Copy Requested) Please bring a copy of your health care power of attorney and living will to the office to be added to your chart at your convenience.  Next Medicare Annual Wellness Visit scheduled for next year: Yes

## 2023-06-23 NOTE — Progress Notes (Signed)
Subjective:   Teresa Rangel is a 82 y.o. female who presents for Medicare Annual (Subsequent) preventive examination.  Visit Complete: Virtual I connected with  Teresa Rangel on 06/23/23 by a audio enabled telemedicine application and verified that I am speaking with the correct person using two identifiers.  Patient Location: Home  Provider Location: Office/Clinic  I discussed the limitations of evaluation and management by telemedicine. The patient expressed understanding and agreed to proceed.  Vital Signs: Because this visit was a virtual/telehealth visit, some criteria may be missing or patient reported. Any vitals not documented were not able to be obtained and vitals that have been documented are patient reported.  Patient Medicare AWV questionnaire was completed by the patient on 06/22/23; I have confirmed that all information answered by patient is correct and no changes since this date. Because this visit was a virtual/telehealth visit, some criteria may be missing or patient reported. Any vitals not documented were not able to be obtained and vitals that have been documented are patient reported.   Cardiac Risk Factors include: advanced age (>104men, >42 women);dyslipidemia;hypertension     Objective:    Today's Vitals   06/23/23 1252  Weight: 157 lb (71.2 kg)   Body mass index is 26.95 kg/m.     06/23/2023    1:04 PM 10/08/2022    8:03 AM 08/28/2022   10:36 PM 05/30/2021   12:04 PM 05/02/2020   10:29 AM 04/30/2019    3:43 PM 07/04/2018   11:00 PM  Advanced Directives  Does Patient Have a Medical Advance Directive? Yes Yes No Yes Yes No No  Type of Estate agent of [place];Living will Living will  Healthcare Power of [place];Living will Healthcare Power of [place];Living will    Does patient want to make changes to medical advance directive?    No - Patient declined     Copy of Healthcare Power of Attorney in Chart? No - copy  requested   No - copy requested No - copy requested    Would patient like information on creating a medical advance directive?   No - Patient declined   No - Patient declined No - Patient declined    Current Medications (verified) Outpatient Encounter Medications as of 06/23/2023  Medication Sig   albuterol (VENTOLIN HFA) 108 (90 Base) MCG/ACT inhaler INHALE TWO PUFFS BY MOUTH EVERY 6 HOURS AS NEEDED FOR WHEEZING OR FOR SHORTNESS OF BREATH   amLODipine (NORVASC) 10 MG tablet Take 1 tablet (10 mg total) by mouth daily.   BREO ELLIPTA 200-25 MCG/ACT AEPB INHALE ONE PUFF BY MOUTH DAILY   carvedilol (COREG) 6.25 MG tablet Take 1 tablet (6.25 mg total) by mouth 2 (two) times daily.   chlorthalidone (HYGROTON) 25 MG tablet Take 1 tablet (25 mg total) by mouth daily.   FLUoxetine (PROZAC) 20 MG capsule Take 1 capsule (20 mg total) by mouth every morning.   fluticasone (FLONASE) 50 MCG/ACT nasal spray Place 2 sprays into both nostrils daily.   Multiple Vitamins-Minerals (PRESERVISION AREDS 2 PO) Take 1 tablet by mouth daily.   Potassium 99 MG TABS Take 1 tablet by mouth daily.   rosuvastatin (CRESTOR) 20 MG tablet Take 1 tablet (20 mg total) by mouth daily.   TURMERIC PO Take 1 tablet by mouth daily.   levothyroxine (SYNTHROID) 88 MCG tablet Take 1 tablet (88 mcg total) by mouth daily before breakfast.   No facility-administered encounter medications on file as of 06/23/2023.    Allergies (  verified) Molds & smuts and Tetracyclines & related   History: Past Medical History:  Diagnosis Date   History of stomach ulcers 1980   Hyperlipidemia    Hypertension    Hypothyroidism    Macular degeneration of left eye    OSA (obstructive sleep apnea)    mild obstructive sleep apnea with an AHI of 10/hr.  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   Benign    Past Surgical History:   Procedure Laterality Date   APPENDECTOMY  1954   COLONOSCOPY     LAPAROSCOPY  1982   THYROIDECTOMY  2007   TONSILLECTOMY  1949   Family History  Problem Relation Age of Onset   Breast cancer Maternal Aunt 64   Social History   Socioeconomic History   Marital status: Married    Spouse name: Not on file   Number of children: 2   Years of education: Not on file   Highest education level: Not on file  Occupational History   Occupation: Retired  Tobacco Use   Smoking status: Never   Smokeless tobacco: Never  Vaping Use   Vaping status: Never Used  Substance and Sexual Activity   Alcohol use: Not Currently    Alcohol/week: 1.0 standard drink of alcohol    Types: 1 Glasses of wine per week    Comment: occassional   Drug use: No   Sexual activity: Yes    Partners: Male  Other Topics Concern   Not on file  Social History Narrative   Caffeine 1 cup a day   Are you right handed or left handed? Right   Are you currently employed ?    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 husband   Who lives with you?    What type of home do you live in: 1 story or 2 story? two       Social Determinants of Health   Financial Resource Strain: Low Risk  (06/22/2023)   Overall Financial Resource Strain (CARDIA)    Difficulty of Paying Living Expenses: Not hard at all  Food Insecurity: No Food Insecurity (06/22/2023)   Hunger Vital Sign    Worried About Running Out of Food in the Last Year: Never true    Ran Out of Food in the Last Year: Never true  Transportation Needs: No Transportation Needs (06/22/2023)   PRAPARE - Administrator, Civil Service (Medical): No    Lack of Transportation (Non-Medical): No  Physical Activity: Insufficiently Active (06/22/2023)   Exercise Vital Sign    Days of Exercise per Week: 2 days    Minutes of Exercise per Session: 20 min  Stress: No Stress Concern Present (06/22/2023)   [employer] of Occupational  Health - Occupational Stress Questionnaire    Feeling of Stress : Not at all  Social Connections: Socially Integrated (06/22/2023)   Social Connection and Isolation Panel [NHANES]    Frequency of Communication with Friends and Family: More than three times a week    Frequency of Social Gatherings with Friends and Family: Three times a week    Attends Religious Services: More than 4 times per year    Active Member of Clubs or Organizations: Yes    Attends Banker Meetings: More than 4 times  per year    Marital Status: Married    Tobacco Counseling Counseling given: Not Answered   Clinical Intake:  Pre-visit preparation completed: Yes  Pain : No/denies pain     BMI - recorded: 26.95 Nutritional Status: BMI 25 -29 Overweight Nutritional Risks: None Diabetes: No  How often do you need to have someone help you when you read instructions, pamphlets, or other written materials from your doctor or pharmacy?: (P) 1 - Never  Interpreter Needed?: No  Information entered by :: Lanier Ensign, LPN   Activities of Daily Living    06/22/2023    1:55 PM  In your present state of health, do you have any difficulty performing the following activities:  Hearing? 0  Vision? 0  Difficulty concentrating or making decisions? 0  Walking or climbing stairs? 0  Dressing or bathing? 0  Doing errands, shopping? 0  Preparing Food and eating ? N  Using the Toilet? N  In the past six months, have you accidently leaked urine? N  Do you have problems with loss of bowel control? N  Managing your Medications? N  Managing your Finances? N  Housekeeping or managing your Housekeeping? N    Patient Care Team: Allwardt, Crist Infante, PA-C as PCP - General (Physician Assistant) Quintella Reichert, MD as PCP - Cardiology (Cardiology) McKenzie, Mardene Celeste, MD as Consulting Physician (Urology) Maeola Sarah, MD as Referring Physician (Ophthalmology)  Indicate any recent Medical Services you may  have received from other than [facility] providers in the past year (date may be approximate).     Assessment:   This is a routine wellness examination for [place].  Hearing/Vision screen Hearing Screening - Comments:: Pt denies any hearing issues  Vision Screening - Comments:: Pt follows up with Dr Emily Filbert for annual eye exams    Goals Addressed             This Visit's Progress    Patient Stated       Maintain health and activity        Depression Screen    06/23/2023    1:06 PM 05/31/2022    1:54 PM 05/30/2021   11:59 AM 05/26/2021    8:34 AM 02/06/2021    2:40 PM 09/19/2020   11:11 AM 06/04/2020   11:35 AM  PHQ 2/9 Scores  PHQ - 2 Score 0 0 0 0 1 4 0  PHQ- 9 Score     5 16     Fall Risk    06/22/2023    1:55 PM 10/08/2022    8:02 AM 05/31/2022    1:56 PM 05/30/2021   12:08 PM 05/26/2021    8:34 AM  Fall Risk   Falls in the past year? 0 1 0 0 0  Number falls in past yr: 0 0 0 0 0  Injury with Fall? 0 0 0 0 0  Risk for fall due to :   No Fall Risks;Impaired vision No Fall Risks No Fall Risks  Follow up  Falls evaluation completed Falls prevention discussed  Education provided    MEDICARE RISK AT HOME: Medicare Risk at Home Any stairs in or around the home?: Yes If so, are there any without handrails?: No Home free of loose throw rugs in walkways, pet beds, electrical cords, etc?: Yes Adequate lighting in your home to reduce risk of falls?: Yes Life alert?: No Use of a cane, walker or w/c?: No Grab bars in the bathroom?: Yes Shower chair or bench in  shower?: Yes Elevated toilet seat or a handicapped toilet?: Yes  TIMED UP AND GO:  Was the test performed?  No    Cognitive Function:        06/23/2023    1:07 PM 05/31/2022    1:57 PM 05/30/2021   12:09 PM 05/02/2020   10:41 AM  6CIT Screen  What Year? 0 points 0 points 0 points 0 points  What month? 0 points 0 points 0 points 0 points  What time? 0 points 0 points 0 points   Count back from 20 0 points 0 points  0 points 0 points  Months in reverse 0 points 0 points 0 points 0 points  Repeat phrase 0 points 0 points 0 points 0 points  Total Score 0 points 0 points 0 points     Immunizations Immunization History  Administered Date(s) Administered   Fluad Quad(high Dose 65+) 05/28/2021, 07/01/2022   Influenza, High Dose Seasonal PF 08/04/2019   PFIZER(Purple Top)SARS-COV-2 Vaccination 10/22/2019, 11/16/2019   Pneumococcal Conjugate-13 07/07/2018   Pneumococcal Polysaccharide-23 06/07/2013   Tdap 05/23/2023   Tetanus 06/07/2013   Zoster Recombinant(Shingrix) 04/12/2022    TDAP status: Up to date  Flu Vaccine status: Due, Education has been provided regarding the importance of this vaccine. Advised may receive this vaccine at local pharmacy or Health Dept. Aware to provide a copy of the vaccination record if obtained from local pharmacy or Health Dept. Verbalized acceptance and understanding.  Pneumococcal vaccine status: Up to date  Covid-19 vaccine status: Information provided on how to obtain vaccines.   Qualifies for Shingles Vaccine? Yes   Zostavax completed No   Shingrix Completed?: No.    Education has been provided regarding the importance of this vaccine. Patient has been advised to call insurance company to determine out of pocket expense if they have not yet received this vaccine. Advised may also receive vaccine at local pharmacy or Health Dept. Verbalized acceptance and understanding.  Screening Tests Health Maintenance  Topic Date Due   COVID-19 Vaccine (3 - Pfizer risk series) 12/14/2019   Zoster Vaccines- Shingrix (2 of 2) 06/07/2022   INFLUENZA VACCINE  12/12/2023 (Originally 04/14/2023)   MAMMOGRAM  04/26/2024   Medicare Annual Wellness (AWV)  06/22/2024   DTaP/Tdap/Td (2 - Td or Tdap) 05/22/2033   Pneumonia Vaccine 15+ Years old  Completed   DEXA SCAN  Completed   HPV VACCINES  Aged Out    Health Maintenance  Health Maintenance Due  Topic Date Due   COVID-19  Vaccine (3 - Pfizer risk series) 12/14/2019   Zoster Vaccines- Shingrix (2 of 2) 06/07/2022    Colorectal cancer screening: No longer required.   Mammogram status: Completed 04/27/23. Repeat every year  Bone Density status: Completed 05/03/19. Results reflect: Bone density results: OSTEOPOROSIS. Repeat every 2 years.  Additional Screening:  Vision Screening: Recommended annual ophthalmology exams for early detection of glaucoma and other disorders of the eye. Is the patient up to date with their annual eye exam?  Yes  Who is the provider or what is the name of the office in which the patient attends annual eye exams? Dr Emily Filbert  If pt is not established with a provider, would they like to be referred to a provider to establish care? No .   Dental Screening: Recommended annual dental exams for proper oral hygiene    Community Resource Referral / Chronic Care Management: CRR required this visit?  No   CCM required this visit?  No  Plan:     I have personally reviewed and noted the following in the patient's chart:   Medical and social history Use of alcohol, tobacco or illicit drugs  Current medications and supplements including opioid prescriptions. Patient is not currently taking opioid prescriptions. Functional ability and status Nutritional status Physical activity Advanced directives List of other physicians Hospitalizations, surgeries, and ER visits in previous 12 months Vitals Screenings to include cognitive, depression, and falls Referrals and appointments  In addition, I have reviewed and discussed with patient certain preventive protocols, quality metrics, and best practice recommendations. A written personalized care plan for preventive services as well as general preventive health recommendations were provided to patient.     Marzella Schlein, LPN   [id number]/29/5621   After Visit Summary: (MyChart) Due to this being a telephonic visit, the after visit summary  with patients personalized plan was offered to patient via MyChart   Nurse Notes: none

## 2023-06-23 NOTE — Addendum Note (Signed)
Addended by: Rosalee Kaufman on: 06/23/2023 12:45 PM   Modules accepted: Orders

## 2023-06-24 DIAGNOSIS — H353221 Exudative age-related macular degeneration, left eye, with active choroidal neovascularization: Secondary | ICD-10-CM | POA: Diagnosis not present

## 2023-06-24 DIAGNOSIS — H35372 Puckering of macula, left eye: Secondary | ICD-10-CM | POA: Diagnosis not present

## 2023-06-24 DIAGNOSIS — H353112 Nonexudative age-related macular degeneration, right eye, intermediate dry stage: Secondary | ICD-10-CM | POA: Diagnosis not present

## 2023-06-24 DIAGNOSIS — H43813 Vitreous degeneration, bilateral: Secondary | ICD-10-CM | POA: Diagnosis not present

## 2023-06-24 DIAGNOSIS — H35033 Hypertensive retinopathy, bilateral: Secondary | ICD-10-CM | POA: Diagnosis not present

## 2023-06-24 LAB — LIPID PANEL
Chol/HDL Ratio: 2.7 ratio (ref 0.0–4.4)
Cholesterol, Total: 161 mg/dL (ref 100–199)
HDL: 60 mg/dL
LDL Chol Calc (NIH): 84 mg/dL (ref 0–99)
Triglycerides: 94 mg/dL (ref 0–149)
VLDL Cholesterol Cal: 17 mg/dL (ref 5–40)

## 2023-06-24 LAB — HEPATIC FUNCTION PANEL
ALT: 16 IU/L (ref 0–32)
AST: 21 IU/L (ref 0–40)
Albumin: 4.1 g/dL (ref 3.7–4.7)
Alkaline Phosphatase: 68 IU/L (ref 44–121)
Bilirubin Total: 0.5 mg/dL (ref 0.0–1.2)
Bilirubin, Direct: 0.15 mg/dL (ref 0.00–0.40)
Total Protein: 6 g/dL (ref 6.0–8.5)

## 2023-06-30 DIAGNOSIS — N3021 Other chronic cystitis with hematuria: Secondary | ICD-10-CM | POA: Diagnosis not present

## 2023-06-30 DIAGNOSIS — N3946 Mixed incontinence: Secondary | ICD-10-CM | POA: Diagnosis not present

## 2023-07-29 DIAGNOSIS — R69 Illness, unspecified: Secondary | ICD-10-CM | POA: Diagnosis not present

## 2023-08-08 DIAGNOSIS — H353221 Exudative age-related macular degeneration, left eye, with active choroidal neovascularization: Secondary | ICD-10-CM | POA: Diagnosis not present

## 2023-08-29 ENCOUNTER — Other Ambulatory Visit: Payer: Self-pay | Admitting: Cardiology

## 2023-09-02 ENCOUNTER — Other Ambulatory Visit: Payer: Self-pay | Admitting: Physician Assistant

## 2023-09-02 MED ORDER — LEVOTHYROXINE SODIUM 88 MCG PO TABS: 88.0000 ug | ORAL_TABLET | Freq: Every day | ORAL | 0 refills | Status: DC

## 2023-09-05 ENCOUNTER — Encounter: Payer: Self-pay | Admitting: Physician Assistant

## 2023-09-05 NOTE — Telephone Encounter (Signed)
Please see patient message and advise.

## 2023-09-06 ENCOUNTER — Ambulatory Visit: Payer: Self-pay | Admitting: Physician Assistant

## 2023-09-06 NOTE — Telephone Encounter (Signed)
Please call patient and have her triaged per PCP

## 2023-09-06 NOTE — Telephone Encounter (Signed)
Lvm for pt requesting  cb to be triaged

## 2023-09-06 NOTE — Telephone Encounter (Signed)
See Triage.

## 2023-09-06 NOTE — Telephone Encounter (Signed)
Noted and agreed, thank you. 

## 2023-09-06 NOTE — Telephone Encounter (Signed)
Copied from CRM [id number]. Topic: Clinical - Red Word Triage >> Sep 06, 2023 10:57 AM Denese Killings wrote: Red Word that prompted transfer to Nurse Triage: Patient received a call regarding blood in stool. Patient was told to callback and speak with a nurse.  Chief Complaint: rectal bleeding yesterday Symptoms: rectal bleeding yesterday with bowel movement none today with bowel movement  Frequency: 1 x Pertinent Negatives: Patient denies abd pain Disposition: [] ED /[] Urgent Care (no appt availability in office) / [] Appointment(In office/virtual)/ []  [facility]/ [x] Home Care/ [] Refused Recommended Disposition /[] [facility]/ []  Follow-up with PCP Additional Notes: pt states history of hemorroids: had bowel movement yesterday and blood in stool more than usually but not a lot: blood was on tissue and some in toliet: had bowel movement today and no blood: using home care hemorrhoid cream, Vaseline, suppository, Aquaphor : pt states these usually help.  Nurse provided education and home care advice and advised to go to ED or Urgent is s/s come back and/or worse especially while office is closed: nurse provided s/s of concern to look: pt verbalized understanding   Reason for Disposition  [1] Normal formed BM AND [2] few streaks or drops of blood on surface of BM  Answer Assessment - Initial Assessment Questions 1. APPEARANCE of BLOOD: "What color is it?" "Is it passed separately, on the surface of the stool, or mixed in with the stool?"      Bright red blood -  2. AMOUNT: "How much blood was passed?"      More than normal but not a lot and no enough to change the color of the water in the toilet . Mostly on paper towel 3. FREQUENCY: "How many times has blood been passed with the stools?"     One bowel  4. ONSET: "When was the blood first seen in the stools?" (Days or weeks)      yesterday 5. DIARRHEA: "Is there also some diarrhea?" If Yes, ask: "How many diarrhea stools in the  past 24 hours?"      None - only regular bowel movement yesterday with blood and regular BM with no blood today 6. CONSTIPATION: "Do you have constipation?" If Yes, ask: "How bad is it?"     Regular bowel movement 7. RECURRENT SYMPTOMS: "Have you had blood in your stools before?" If Yes, ask: "When was the last time?" and "What happened that time?"      Pt has history of hemorrhoids - they are visible on outside and can feel them - pt states she keeps them medicated with hemorrhoid cream, Vaseline, suppository, Aquaphor - and these usually help 8. BLOOD THINNERS: "Do you take any blood thinners?" (e.g., Coumadin/warfarin, Pradaxa/dabigatran, aspirin)     N/a 9. OTHER SYMPTOMS: "Do you have any other symptoms?"  (e.g., abdomen pain, vomiting, dizziness, fever)     N/a 10. PREGNANCY: "Is there any chance you are pregnant?" "When was your last menstrual period?"       N/a  Protocols used: Rectal Bleeding-A-AH

## 2023-09-08 ENCOUNTER — Other Ambulatory Visit: Payer: Self-pay | Admitting: Cardiology

## 2023-09-12 ENCOUNTER — Other Ambulatory Visit: Payer: Self-pay | Admitting: Cardiology

## 2023-09-13 ENCOUNTER — Encounter: Payer: Self-pay | Admitting: Cardiovascular Disease

## 2023-09-13 ENCOUNTER — Other Ambulatory Visit: Payer: Self-pay | Admitting: Cardiology

## 2023-09-13 DIAGNOSIS — E785 Hyperlipidemia, unspecified: Secondary | ICD-10-CM

## 2023-09-26 ENCOUNTER — Other Ambulatory Visit: Payer: Self-pay | Admitting: Cardiology

## 2023-09-28 ENCOUNTER — Other Ambulatory Visit: Payer: Self-pay | Admitting: Cardiology

## 2023-10-03 DIAGNOSIS — H353221 Exudative age-related macular degeneration, left eye, with active choroidal neovascularization: Secondary | ICD-10-CM | POA: Diagnosis not present

## 2023-10-03 DIAGNOSIS — H52203 Unspecified astigmatism, bilateral: Secondary | ICD-10-CM | POA: Diagnosis not present

## 2023-10-03 DIAGNOSIS — H353111 Nonexudative age-related macular degeneration, right eye, early dry stage: Secondary | ICD-10-CM | POA: Diagnosis not present

## 2023-10-07 ENCOUNTER — Ambulatory Visit (HOSPITAL_COMMUNITY)
Admission: RE | Admit: 2023-10-07 | Discharge: 2023-10-07 | Disposition: A | Discharge status: Home / Self Care | Payer: PPO | Source: Ambulatory Visit | Attending: Internal Medicine | Admitting: Internal Medicine

## 2023-10-07 DIAGNOSIS — I129 Hypertensive chronic kidney disease with stage 1 through stage 4 chronic kidney disease, or unspecified chronic kidney disease: Secondary | ICD-10-CM | POA: Diagnosis not present

## 2023-10-08 ENCOUNTER — Other Ambulatory Visit: Payer: Self-pay | Admitting: Cardiology

## 2023-10-24 ENCOUNTER — Other Ambulatory Visit: Payer: Self-pay | Admitting: Cardiology

## 2023-10-24 ENCOUNTER — Other Ambulatory Visit: Payer: Self-pay | Admitting: Pulmonary Disease

## 2023-10-25 ENCOUNTER — Other Ambulatory Visit: Payer: Self-pay | Admitting: Pulmonary Disease

## 2023-10-28 DIAGNOSIS — H353221 Exudative age-related macular degeneration, left eye, with active choroidal neovascularization: Secondary | ICD-10-CM | POA: Diagnosis not present

## 2023-11-17 ENCOUNTER — Encounter: Payer: Self-pay | Admitting: Pulmonary Disease

## 2023-11-25 ENCOUNTER — Other Ambulatory Visit: Payer: Self-pay | Admitting: Cardiology

## 2023-11-30 DIAGNOSIS — M79641 Pain in right hand: Secondary | ICD-10-CM | POA: Diagnosis not present

## 2023-11-30 DIAGNOSIS — M151 Heberden's nodes (with arthropathy): Secondary | ICD-10-CM | POA: Diagnosis not present

## 2023-11-30 DIAGNOSIS — M79642 Pain in left hand: Secondary | ICD-10-CM | POA: Diagnosis not present

## 2023-12-01 DIAGNOSIS — H353112 Nonexudative age-related macular degeneration, right eye, intermediate dry stage: Secondary | ICD-10-CM | POA: Diagnosis not present

## 2023-12-01 DIAGNOSIS — H353221 Exudative age-related macular degeneration, left eye, with active choroidal neovascularization: Secondary | ICD-10-CM | POA: Diagnosis not present

## 2023-12-01 DIAGNOSIS — H35372 Puckering of macula, left eye: Secondary | ICD-10-CM | POA: Diagnosis not present

## 2023-12-01 DIAGNOSIS — H35033 Hypertensive retinopathy, bilateral: Secondary | ICD-10-CM | POA: Diagnosis not present

## 2023-12-01 DIAGNOSIS — H43813 Vitreous degeneration, bilateral: Secondary | ICD-10-CM | POA: Diagnosis not present

## 2023-12-04 ENCOUNTER — Other Ambulatory Visit: Payer: Self-pay | Admitting: Physician Assistant

## 2023-12-07 ENCOUNTER — Other Ambulatory Visit: Payer: Self-pay | Admitting: Cardiology

## 2023-12-12 ENCOUNTER — Other Ambulatory Visit: Payer: Self-pay | Admitting: Cardiology

## 2023-12-12 ENCOUNTER — Other Ambulatory Visit (HOSPITAL_BASED_OUTPATIENT_CLINIC_OR_DEPARTMENT_OTHER): Payer: Self-pay | Admitting: Cardiology

## 2023-12-12 DIAGNOSIS — E785 Hyperlipidemia, unspecified: Secondary | ICD-10-CM

## 2023-12-12 NOTE — Telephone Encounter (Signed)
*  STAT* If patient is at the pharmacy, call can be transferred to refill team.   1. Which medications need to be refilled? (please list name of each medication and dose if known) Pravastatin   2. Would you like to learn more about the convenience, safety, & potential cost savings by using the [facility]?     3. Are you open to using the [facility] (Type [facility]. .   4. Which pharmacy/location (including street and city if local pharmacy) is medication to be sent to? [facility] [address], [place]   5. Do they need a 30 day or 90 day supply? Enough her appointment on 01-20-24

## 2023-12-14 ENCOUNTER — Encounter: Payer: Self-pay | Admitting: Cardiology

## 2023-12-19 ENCOUNTER — Encounter: Payer: Self-pay | Admitting: Cardiology

## 2023-12-19 MED ORDER — ROSUVASTATIN CALCIUM 20 MG PO TABS: 20.0000 mg | ORAL_TABLET | Freq: Every day | ORAL | 1 refills | Status: DC

## 2023-12-19 NOTE — Telephone Encounter (Signed)
 I'm not sure either, but go ahead and give her the pravastatin 80 mg for now.  Discontinue the rosuvastatin.  Thanks

## 2023-12-20 MED ORDER — PRAVASTATIN SODIUM 80 MG PO TABS: 80.0000 mg | ORAL_TABLET | Freq: Every evening | ORAL | 1 refills | Status: DC

## 2023-12-26 ENCOUNTER — Encounter: Payer: Self-pay | Admitting: Cardiology

## 2023-12-26 ENCOUNTER — Other Ambulatory Visit: Payer: Self-pay | Admitting: *Deleted

## 2023-12-26 MED ORDER — AMLODIPINE BESYLATE 10 MG PO TABS
10.0000 mg | ORAL_TABLET | Freq: Every day | ORAL | 0 refills | Status: DC
Start: 2023-12-26 — End: 2024-02-07

## 2023-12-26 MED ORDER — CHLORTHALIDONE 25 MG PO TABS: 25.0000 mg | ORAL_TABLET | Freq: Every day | ORAL | 0 refills | Status: DC

## 2024-01-03 DIAGNOSIS — M5416 Radiculopathy, lumbar region: Secondary | ICD-10-CM | POA: Diagnosis not present

## 2024-01-03 DIAGNOSIS — R2991 Unspecified symptoms and signs involving the musculoskeletal system: Secondary | ICD-10-CM | POA: Insufficient documentation

## 2024-01-03 DIAGNOSIS — M4316 Spondylolisthesis, lumbar region: Secondary | ICD-10-CM | POA: Insufficient documentation

## 2024-01-03 DIAGNOSIS — M545 Low back pain, unspecified: Secondary | ICD-10-CM | POA: Diagnosis not present

## 2024-01-10 ENCOUNTER — Encounter: Payer: Self-pay | Admitting: Nurse Practitioner

## 2024-01-10 ENCOUNTER — Ambulatory Visit: Admitting: Nurse Practitioner

## 2024-01-10 VITALS — BP 118/62 | HR 60 | Temp 98.1°F | Ht 64.0 in | Wt 160.6 lb

## 2024-01-10 DIAGNOSIS — J45991 Cough variant asthma: Secondary | ICD-10-CM | POA: Diagnosis not present

## 2024-01-10 DIAGNOSIS — J4530 Mild persistent asthma, uncomplicated: Secondary | ICD-10-CM

## 2024-01-10 DIAGNOSIS — R058 Other specified cough: Secondary | ICD-10-CM | POA: Insufficient documentation

## 2024-01-10 DIAGNOSIS — J309 Allergic rhinitis, unspecified: Secondary | ICD-10-CM | POA: Insufficient documentation

## 2024-01-10 LAB — NITRIC OXIDE: Nitric Oxide: 10

## 2024-01-10 MED ORDER — ALBUTEROL SULFATE HFA 108 (90 BASE) MCG/ACT IN AERS: 1.0000 | INHALATION_SPRAY | Freq: Four times a day (QID) | RESPIRATORY_TRACT | 2 refills | Status: AC | PRN

## 2024-01-10 MED ORDER — FLUTICASONE PROPIONATE 50 MCG/ACT NA SUSP: 2.0000 | Freq: Every day | NASAL | 6 refills | Status: AC

## 2024-01-10 MED ORDER — FLUTICASONE FUROATE-VILANTEROL 200-25 MCG/ACT IN AEPB: 1.0000 | INHALATION_SPRAY | Freq: Every day | RESPIRATORY_TRACT | 5 refills | Status: DC

## 2024-01-10 NOTE — Patient Instructions (Addendum)
 Continue Albuterol  inhaler 2 puffs every 6 hours as needed for shortness of breath or wheezing. Notify if symptoms persist despite rescue inhaler/neb use.  Restart Breo 1 puff daily. Brush tongue and rinse mouth afterwards Restart flonase  nasal spray 2 sprays each nostril daily   Your breathing testing today was normal  Follow up in 6 weeks with Dr. Marygrace Rangel or Teresa Jerry Shylynn Bruning,NP to see how restarting inhaler is going. If symptoms do not improve or worsen, please contact office for sooner follow up or seek emergency care.

## 2024-01-10 NOTE — Assessment & Plan Note (Addendum)
 Concern for asthma with recurrent episodes of bronchitis and environmental triggers. She has been well managed on ICS/LABA with Breo. She ran out approx. 1-2 months ago with increased cough. Her exhaled nitric oxide testing is normal today and lung exam clear so she does not appear to be in acute exacerbation. Suspect a component of her cough is related to upper airway irritation from postnasal drainage. Will have her resume her Breo and flonase . Advised to monitor response. If no improvement, can consider chest imaging and further workup with PFT. Action plan in place. Refilled albuterol  to have on hand PRN.   Patient Instructions  Continue Albuterol  inhaler 2 puffs every 6 hours as needed for shortness of breath or wheezing. Notify if symptoms persist despite rescue inhaler/neb use.  Restart Breo 1 puff daily. Brush tongue and rinse mouth afterwards Restart flonase  nasal spray 2 sprays each nostril daily   Your breathing testing today was normal  Follow up in 6 weeks with Teresa Rangel or Teresa Jerry Loria Lacina,NP to see how restarting inhaler is going. If symptoms do not improve or worsen, please contact office for sooner follow up or seek emergency care.

## 2024-01-10 NOTE — Assessment & Plan Note (Signed)
 See above. Advised on measures to minimize further upper airway inflammation/irritation.

## 2024-01-10 NOTE — Progress Notes (Signed)
 @Patient  ID: Teresa Rangel, female    DOB: 06-27-1941, 83 y.o.   MRN: [id number]  Chief Complaint  Patient presents with   Follow-up    Need refill on Breo, ran out 1-2 mths.C/o clearing throat a lot this wk.,hoarseness, denies wheeaing. Denies sob    Referring provider: Allwardt, Deleta Felix, PA-C  HPI: 83 year old female, never smoker followed for asthma and chronic cough. She is a patient of Dr. Cari Char and last seen in office 02/25/2022. Past medical history significant for HTN, OSA, hypothyroid, anxiety, depression, HLD, insomnia.   TEST/EVENTS:  08/29/2022 CXR: no acute process  02/25/2022: OV with Dr. Marygrace Snellen. Hx of recurrent bronchitis, cough with seasonal changes and pollen as well as URI 1-3 times a year. Treated numerous times with azithromycin  and some prednisone . Improved with Breo. Continue high dose Breo 1 puff daily. Instructed to continued and utilize albuterol  PRN - has not needed in last several months. Postnasal drip likely contributing to cough. Prior chest imaging clear.   01/10/2024: Today - acute Discussed the use of AI scribe software for clinical note transcription with the patient, who gave verbal consent to proceed.  History of Present Illness   Teresa Rangel is an 83 year old female with history of recurrent bronchitis and treated for asthma who presents with increased coughing and hoarseness after running out of Breo.  She has experienced increased coughing and hoarseness after running out of her Breo inhaler one to two months ago. She has a history of recurrent bronchitis and has been using Breo for management, which has effectively prevented bronchitis episodes for the past two years. No recent use of steroids or antibiotics since starting Breo.  She experiences possible allergy symptoms, including postnasal drainage and hoarseness, but is not currently taking any allergy medications. She has Flonase  nasal spray but has not used it recently.  This has helped in the past.   She sometimes gets up clear phlegm but usually dry. Has more throat clearing than actual cough. No wheezing or shortness of breath.   She has not used her albuterol  inhaler in over a year, and it is likely expired.   No fever, chills, or hemoptysis. Her appetite remains good, though she mentions eating 'too much' recently.     FeNO 10 ppb   Allergies  Allergen Reactions   Molds & Smuts     Other reaction(s): cough   Tetracyclines & Related     Stomach cramps    Immunization History  Administered Date(s) Administered   Fluad Quad(high Dose 65+) 05/28/2021, 07/01/2022   Influenza, High Dose Seasonal PF 08/04/2019   PFIZER(Purple Top)SARS-COV-2 Vaccination 10/22/2019, 11/16/2019   Pneumococcal Conjugate-13 07/07/2018   Pneumococcal Polysaccharide-23 06/07/2013   Tdap 05/23/2023   Tetanus 06/07/2013   Zoster Recombinant(Shingrix) 04/12/2022    Past Medical History:  Diagnosis Date   History of stomach ulcers 1980   Hyperlipidemia    Hypertension    Hypothyroidism    Macular degeneration of left eye    OSA (obstructive sleep apnea)    mild obstructive sleep apnea with an AHI of 10/hr.  Auto CPAP from 4 to 15 cm H2O   Renal artery stenosis (HCC)    right >60% and left on upper end of 1-59% by dopplers 1/24   Tumor of thyroid  08/23/2019   Benign     Tobacco History: Social History   Tobacco Use  Smoking Status Never  Smokeless Tobacco Never   Counseling given: Not Answered  Outpatient Medications Prior to Visit  Medication Sig Dispense Refill   amLODipine  (NORVASC ) 10 MG tablet Take 1 tablet (10 mg total) by mouth daily. Please call [phone] to schedule an overdue appointment for future refills.  Thank you. 2nd attempt. 45 tablet 0   carvedilol  (COREG ) 6.25 MG tablet TAKE 1 TABLET BY MOUTH 2 TIMES A DAY 90 tablet 3   chlorthalidone  (HYGROTON ) 25 MG tablet Take 1 tablet (25 mg total) by mouth daily. 45 tablet 0   FLUoxetine   (PROZAC ) 20 MG capsule Take 1 capsule (20 mg total) by mouth every morning. 90 capsule 3   levothyroxine  (SYNTHROID ) 88 MCG tablet TAKE 1 TABLET BY MOUTH DAILY BEFORE BREAKFAST 90 tablet 0   Multiple Vitamins-Minerals (PRESERVISION AREDS 2 PO) Take 1 tablet by mouth daily.     Potassium 99 MG TABS Take 1 tablet by mouth daily.     pravastatin  (PRAVACHOL ) 80 MG tablet Take 1 tablet (80 mg total) by mouth every evening. 90 tablet 1   TURMERIC PO Take 1 tablet by mouth daily.     fluticasone  (FLONASE ) 50 MCG/ACT nasal spray Place 2 sprays into both nostrils daily. 16 g 6   albuterol  (VENTOLIN  HFA) 108 (90 Base) MCG/ACT inhaler INHALE TWO PUFFS BY MOUTH EVERY 6 HOURS AS NEEDED FOR WHEEZING OR FOR SHORTNESS OF BREATH (Patient not taking: Reported on 01/10/2024) 18 g 1   BREO ELLIPTA  200-25 MCG/ACT AEPB INHALE ONE PUFF BY MOUTH DAILY (Patient not taking: Reported on 01/10/2024) 60 each 11   No facility-administered medications prior to visit.     Review of Systems:   Constitutional: No weight loss or gain, night sweats, fevers, chills, fatigue, or lassitude. HEENT: No headaches, difficulty swallowing, tooth/dental problems, or sore throat. No sneezing, itching, ear ache +nasal congestion, post nasal drip, hoarseness, throat clearing  CV:  No chest pain, orthopnea, PND, swelling in lower extremities, anasarca, dizziness, palpitations, syncope Resp: +cough. No shortness of breath with exertion or at rest. No excess mucus or change in color of mucus. No hemoptysis. No wheezing.  No chest wall deformity GI:  No heartburn, indigestion, abdominal pain, nausea, vomiting, diarrhea, change in bowel habits, loss of appetite, bloody stools.  Skin: No rash, lesions, ulcerations MSK:  No joint pain or swelling.   Neuro: No dizziness or lightheadedness.  Psych: No depression or anxiety. Mood stable.     Physical Exam:  BP 118/62 (BP Location: Right Arm, Patient Position: Sitting, Cuff Size: Normal)   Pulse  60   Temp 98.1 F (36.7 C) (Oral)   Ht 5\' 4"  (1.626 m)   Wt 160 lb 9.6 oz (72.8 kg)   LMP  (LMP Unknown)   SpO2 99%   BMI 27.57 kg/m   GEN: Pleasant, interactive, well-appearing; in no acute distress HEENT:  Normocephalic and atraumatic. PERRLA. Sclera [place]lin. Nasal turbinates erythematous, moist and patent bilaterally. Clear rhinorrhea present. Oropharynx pink and moist, without exudate or edema. No lesions, ulcerations. Hoarse voice quality  NECK:  Supple w/ fair ROM. No JVD present. Normal carotid impulses w/o bruits. Thyroid  symmetrical with no goiter or nodules palpated. No lymphadenopathy.   CV: RRR, no m/r/g, no peripheral edema. Pulses intact, +2 bilaterally. No cyanosis, pallor or clubbing. PULMONARY:  Unlabored, regular breathing. Clear bilaterally A&P w/o wheezes/rales/rhonchi. No accessory muscle use.  GI: BS present and normoactive. Soft, non-tender to palpation. No organomegaly or masses detected.  MSK: No erythema, warmth or tenderness. Cap refil <2 sec all extrem. No deformities or joint swelling  noted.  Neuro: A/Ox3. No focal deficits noted.   Skin: Warm, no lesions or rashe Psych: Normal affect and behavior. Judgement and thought content appropriate.     Lab Results:  CBC    Component Value Date/Time   WBC 8.2 08/28/2022 2239   RBC 5.20 (H) 08/28/2022 2239   HGB 14.9 08/28/2022 2239   HCT 42.7 08/28/2022 2239   PLT 240 08/28/2022 2239   MCV 82.1 08/28/2022 2239   MCH 28.7 08/28/2022 2239   MCHC 34.9 08/28/2022 2239   RDW 13.0 08/28/2022 2239   LYMPHSABS 1.7 08/17/2022 1537   MONOABS 0.6 08/17/2022 1537   EOSABS 0.3 08/17/2022 1537   BASOSABS 0.0 08/17/2022 1537    BMET    Component Value Date/Time   NA 141 12/10/2022 0750   K 4.0 12/10/2022 0750   CL 101 12/10/2022 0750   CO2 27 12/10/2022 0750   GLUCOSE 102 (H) 12/10/2022 0750   GLUCOSE 113 (H) 08/28/2022 2239   BUN 21 12/10/2022 0750   CREATININE 0.82 12/10/2022 0750   CREATININE 0.74  06/04/2020 1215   CALCIUM  9.4 12/10/2022 0750   GFRNONAA >60 08/28/2022 2239   GFRNONAA 77 06/04/2020 1215   GFRAA 89 06/04/2020 1215    BNP No results found for: "BNP"   Imaging:  No results found.  Administration History     None           No data to display          Lab Results  Component Value Date   NITRICOXIDE 10 01/10/2024        Assessment & Plan:   Cough variant asthma Concern for asthma with recurrent episodes of bronchitis and environmental triggers. She has been well managed on ICS/LABA with Breo. She ran out approx. 1-2 months ago with increased cough. Her exhaled nitric oxide testing is normal today and lung exam clear so she does not appear to be in acute exacerbation. Suspect a component of her cough is related to upper airway irritation from postnasal drainage. Will have her resume her Breo and flonase . Advised to monitor response. If no improvement, can consider chest imaging and further workup with PFT. Action plan in place. Refilled albuterol  to have on hand PRN.   Patient Instructions  Continue Albuterol  inhaler 2 puffs every 6 hours as needed for shortness of breath or wheezing. Notify if symptoms persist despite rescue inhaler/neb use.  Restart Breo 1 puff daily. Brush tongue and rinse mouth afterwards Restart flonase  nasal spray 2 sprays each nostril daily   Your breathing testing today was normal  Follow up in 6 weeks with Dr. Marygrace Snellen or Alston Jerry Desiree Daise,NP to see how restarting inhaler is going. If symptoms do not improve or worsen, please contact office for sooner follow up or seek emergency care.     Upper airway cough syndrome See above. Advised on measures to minimize further upper airway inflammation/irritation.   Allergic rhinitis Resume intranasal steroid. Consider daily non drowsy antihistamine if problem persists.    Advised if symptoms do not improve or worsen, to please contact office for sooner follow up or seek emergency  care.   I spent 35 minutes of dedicated to the care of this patient on the date of this encounter to include pre-visit review of records, face-to-face time with the patient discussing conditions above, post visit ordering of testing, clinical documentation with the electronic health record, making appropriate referrals as documented, and communicating necessary findings to members of the patients care team.  Roetta Clarke, NP 01/10/2024  Pt aware and understands NP's role.

## 2024-01-10 NOTE — Assessment & Plan Note (Signed)
 Resume intranasal steroid. Consider daily non drowsy antihistamine if problem persists.

## 2024-01-11 DIAGNOSIS — L720 Epidermal cyst: Secondary | ICD-10-CM | POA: Diagnosis not present

## 2024-01-11 DIAGNOSIS — D1801 Hemangioma of skin and subcutaneous tissue: Secondary | ICD-10-CM | POA: Diagnosis not present

## 2024-01-11 DIAGNOSIS — L821 Other seborrheic keratosis: Secondary | ICD-10-CM | POA: Diagnosis not present

## 2024-01-11 DIAGNOSIS — L814 Other melanin hyperpigmentation: Secondary | ICD-10-CM | POA: Diagnosis not present

## 2024-01-11 DIAGNOSIS — L718 Other rosacea: Secondary | ICD-10-CM | POA: Diagnosis not present

## 2024-01-13 DIAGNOSIS — H353221 Exudative age-related macular degeneration, left eye, with active choroidal neovascularization: Secondary | ICD-10-CM | POA: Diagnosis not present

## 2024-01-20 ENCOUNTER — Ambulatory Visit: Attending: Cardiology | Admitting: Cardiology

## 2024-01-20 ENCOUNTER — Encounter: Payer: Self-pay | Admitting: Cardiology

## 2024-01-20 VITALS — BP 140/70 | HR 56 | Ht 64.0 in | Wt 156.4 lb

## 2024-01-20 DIAGNOSIS — E785 Hyperlipidemia, unspecified: Secondary | ICD-10-CM

## 2024-01-20 DIAGNOSIS — I701 Atherosclerosis of renal artery: Secondary | ICD-10-CM | POA: Diagnosis not present

## 2024-01-20 DIAGNOSIS — G4733 Obstructive sleep apnea (adult) (pediatric): Secondary | ICD-10-CM

## 2024-01-20 DIAGNOSIS — I10 Essential (primary) hypertension: Secondary | ICD-10-CM | POA: Diagnosis not present

## 2024-01-20 NOTE — Addendum Note (Signed)
 Addended by: Demonte Dobratz on: 01/20/2024 08:40 AM   Modules accepted: Orders

## 2024-01-20 NOTE — Progress Notes (Signed)
 Cardiology Note    Date:  01/20/2024   ID:  Mykel, Zwicky 15-Feb-1941, MRN [id number]  PCP:  Alda Amas, PA-C  Cardiologist:  Gaylyn Keas, MD   Chief Complaint  Patient presents with   Hypertension    History of Present Illness:  Teresa Rangel is a 83 y.o. female  with a hx of HLD, HTN and hypothyroidism who has been having episodes of dizziness.  She was initially referred to cardiology for hypertension management.  2D echo 09/08/2022 showed EF 60 to 65% with grade 1 diastolic dysfunction, trivial MR.  Her BP has been difficult to control and renal artery Dopplers were performed which demonstrated > 60% stenosis of the right renal artery and 1 to 59% stenosis of the left renal artery.  Her BP meds were adjusted and she has been on amlodipine  10 mg daily, carvedilol  6.25 mg twice daily, Chlorthalidone  25 mg daily, irbesartan  initially 300 mg daily but this was dropped down to 75 mg daily due to decrease in blood pressure after initiating beta-blocker therapy.    She was referred to Dr. Katheryne Pane for vascular evaluation.  She felt there was a possibility that her hypertension was renovascular mediated.    Renal duplex 09/2023 showed 1-59% Bilateral RAS.  She also was found to have mild obstructive sleep apnea with an AHI of 10/hr.  Auto CPAP from 4 to 15 cm H2O was ordered but she declined.  She is here today for followup and is doing well.  She denies any exertional chest pain or pressure, SOB, DOE, PND, orthopnea, LE edema, dizziness, palpitations or syncope. She is compliant with her meds and is tolerating meds with no SE.    Past Medical History:  Diagnosis Date   History of stomach ulcers 1980   Hyperlipidemia    Hypertension    Hypothyroidism    Macular degeneration of left eye    OSA (obstructive sleep apnea)    mild obstructive sleep apnea with an AHI of 10/hr.  Auto CPAP from 4 to 15 cm H2O   Renal artery stenosis (HCC)    right >60% and left on upper  end of 1-59% by dopplers 1/24   Tumor of thyroid  08/23/2019   Benign     Past Surgical History:  Procedure Laterality Date   APPENDECTOMY  1954   COLONOSCOPY     LAPAROSCOPY  1982   THYROIDECTOMY  2007   TONSILLECTOMY  1949    Current Medications: Current Meds  Medication Sig   albuterol  (VENTOLIN  HFA) 108 (90 Base) MCG/ACT inhaler Inhale 1-2 puffs into the lungs every 6 (six) hours as needed for wheezing or shortness of breath.   amLODipine  (NORVASC ) 10 MG tablet Take 1 tablet (10 mg total) by mouth daily. Please call [phone] to schedule an overdue appointment for future refills.  Thank you. 2nd attempt.   carvedilol  (COREG ) 6.25 MG tablet TAKE 1 TABLET BY MOUTH 2 TIMES A DAY   chlorthalidone  (HYGROTON ) 25 MG tablet Take 1 tablet (25 mg total) by mouth daily.   FLUoxetine  (PROZAC ) 20 MG capsule Take 1 capsule (20 mg total) by mouth every morning.   fluticasone  (FLONASE ) 50 MCG/ACT nasal spray Place 2 sprays into both nostrils daily.   fluticasone  furoate-vilanterol (BREO ELLIPTA ) 200-25 MCG/ACT AEPB Inhale 1 puff into the lungs daily.   levothyroxine  (SYNTHROID ) 88 MCG tablet TAKE 1 TABLET BY MOUTH DAILY BEFORE BREAKFAST   Multiple Vitamins-Minerals (PRESERVISION AREDS 2 PO) Take 1 tablet  by mouth daily.   Potassium 99 MG TABS Take 1 tablet by mouth daily.   pravastatin  (PRAVACHOL ) 80 MG tablet Take 1 tablet (80 mg total) by mouth every evening.   TURMERIC PO Take 1 tablet by mouth daily.    Allergies:   Molds & smuts and Tetracyclines & related   Social History   Socioeconomic History   Marital status: Married    Spouse name: Not on file   Number of children: 2   Years of education: Not on file   Highest education level: Not on file  Occupational History   Occupation: Retired  Tobacco Use   Smoking status: Never   Smokeless tobacco: Never  Vaping Use   Vaping status: Never Used  Substance and Sexual Activity   Alcohol use: Not Currently    Alcohol/week: 1.0  standard drink of alcohol    Types: 1 Glasses of wine per week    Comment: occassional   Drug use: No   Sexual activity: Yes    Partners: Male  Other Topics Concern   Not on file  Social History Narrative   Caffeine 1 cup a day   Are you right handed or left handed? Right   Are you currently employed ?    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 husband   Who lives with you?    What type of home do you live in: 1 story or 2 story? two       Social Drivers of Corporate investment banker Strain: Low Risk  (06/22/2023)   Overall Financial Resource Strain (CARDIA)    Difficulty of Paying Living Expenses: Not hard at all  Food Insecurity: No Food Insecurity (06/22/2023)   Hunger Vital Sign    Worried About Running Out of Food in the Last Year: Never true    Ran Out of Food in the Last Year: Never true  Transportation Needs: No Transportation Needs (06/22/2023)   PRAPARE - Administrator, Civil Service (Medical): No    Lack of Transportation (Non-Medical): No  Physical Activity: Insufficiently Active (06/22/2023)   Exercise Vital Sign    Days of Exercise per Week: 2 days    Minutes of Exercise per Session: 20 min  Stress: No Stress Concern Present (06/22/2023)   [employer] of Occupational Health - Occupational Stress Questionnaire    Feeling of Stress : Not at all  Social Connections: Socially Integrated (06/22/2023)   Social Connection and Isolation Panel [NHANES]    Frequency of Communication with Friends and Family: More than three times a week    Frequency of Social Gatherings with Friends and Family: Three times a week    Attends Religious Services: More than 4 times per year    Active Member of Clubs or Organizations: Yes    Attends Engineer, structural: More than 4 times per year    Marital Status: Married     Family History:  The patient's family history includes Breast cancer (age of onset: 42) in her maternal  aunt.   ROS:   Please see the history of present illness.    ROS All other systems reviewed and are negative.      No data to display             PHYSICAL EXAM:   VS:  LMP  (LMP Unknown)    GEN: Well nourished, well developed in no acute distress HEENT: Normal  NECK: No JVD; No carotid bruits LYMPHATICS: No lymphadenopathy CARDIAC:RRR, no murmurs, rubs, gallops RESPIRATORY:  Clear to auscultation without rales, wheezing or rhonchi  ABDOMEN: Soft, non-tender, non-distended MUSCULOSKELETAL:  No edema; No deformity  SKIN: Warm and dry NEUROLOGIC:  Alert and oriented x 3 PSYCHIATRIC:  Normal affect   Wt Readings from Last 3 Encounters:  01/10/24 160 lb 9.6 oz (72.8 kg)  06/23/23 157 lb (71.2 kg)  04/12/23 157 lb 3.2 oz (71.3 kg)      Studies/Labs Reviewed:   EKG Interpretation Date/Time:  Friday Jan 20 2024 08:11:45 EDT Ventricular Rate:  56 PR Interval:  154 QRS Duration:  80 QT Interval:  444 QTC Calculation: 428 R Axis:   -10  Text Interpretation: Sinus bradycardia Minimal voltage criteria for LVH, may be normal variant ( R in aVL ) Possible Anterior infarct (cited on or before 04-Jul-2018) When compared with ECG of 28-Aug-2022 22:47, Nonspecific T wave abnormality no longer evident in Lateral leads Confirmed by Gaylyn Keas ([id number]) on 01/20/2024 8:24:12 AM    Recent Labs: 04/15/2023: TSH 1.66 06/20/2023: ALT 16   Lipid Panel    Component Value Date/Time   CHOL 161 06/20/2023 0000   TRIG 94 06/20/2023 0000   HDL 60 06/20/2023 0000   CHOLHDL 2.7 06/20/2023 0000   CHOLHDL 3 02/02/2022 1029   VLDL 23.2 02/02/2022 1029   LDLCALC 84 06/20/2023 0000   LDLCALC 156 (H) 06/04/2020 1215   LDLDIRECT 133.1 12/07/2012 1049     Additional studies/ records that were reviewed today include:  OV notes from PCP    ASSESSMENT:    1. Essential hypertension   2. Renal artery stenosis (HCC)   3. OSA (obstructive sleep apnea)   4. Dyslipidemia      PLAN:  In  order of problems listed above:  #HTN #Renal Artery Stenosis -renal artery Dopplers 09/2023 1 to 59% bilateral RAS -24-hour urine for catecholamines and TSH were normal -She has has an abdominal CTA 10/2022 with 50% bilateral RAS -Home sleep study did show mild obstructive sleep apnea >> declined treatment -2D echo showed normal LV function and no LVH -BP controlled on exam today -continue Amlodipine  10mg  daily, Carvedilol  6.25mg  BID, Chlorthalidone  25mg  daily with PRN refills -check BMET  #OSA -mild obstructive sleep apnea with an AHI of 10/hr.   -CPAP was recommended but declined  #Hyperlipidemia -LDL goal less than 70 -Continue Pravastatin  80mg  daily with PRN refills -check FLP and ALT  Time Spent: 20 minutes total time of encounter, including 15 minutes spent in face-to-face patient care on the date of this encounter. This time includes coordination of care and counseling regarding above mentioned problem list. Remainder of non-face-to-face time involved reviewing chart documents/testing relevant to the patient encounter and documentation in the medical record. I have independently reviewed documentation from referring provider  Medication Adjustments/Labs and Tests Ordered: Current medicines are reviewed at length with the patient today.  Concerns regarding medicines are outlined above.  Medication changes, Labs and Tests ordered today are listed in the Patient Instructions below.  There are no Patient Instructions on file for this visit.   Signed, Gaylyn Keas, MD  01/20/2024 8:09 AM    [facility] [address] [place], [place], [place]  [address] Phone: [phone]; Fax: [phone]

## 2024-01-20 NOTE — Patient Instructions (Signed)
 Medication Instructions:  No changes *If you need a refill on your cardiac medications before your next appointment, please call your pharmacy*  Lab Work: Fasting lipids, CMP If you have labs (blood work) drawn today and your tests are completely normal, you will receive your results only by: MyChart Message (if you have MyChart) OR A paper copy in the mail If you have any lab test that is abnormal or we need to change your treatment, we will call you to review the results.  Testing/Procedures: none  Follow-Up: At [facility], you and your health needs are our priority.  As part of our continuing mission to provide you with exceptional heart care, our providers are all part of one team.  This team includes your primary Cardiologist (physician) and Advanced Practice Providers or APPs (Physician Assistants and Nurse Practitioners) who all work together to provide you with the care you need, when you need it.  Your next appointment:   12 month(s)  Provider:   Gaylyn Keas, MD    We recommend signing up for the patient portal called "MyChart".  Sign up information is provided on this After Visit Summary.  MyChart is used to connect with patients for Virtual Visits (Telemedicine).  Patients are able to view lab/test results, encounter notes, upcoming appointments, etc.  Non-urgent messages can be sent to your provider as well.   To learn more about what you can do with MyChart, go to [email or web address].   Other Instructions

## 2024-01-21 LAB — COMPREHENSIVE METABOLIC PANEL WITH GFR
ALT: 16 IU/L (ref 0–32)
AST: 21 IU/L (ref 0–40)
Albumin: 4.4 g/dL (ref 3.7–4.7)
Alkaline Phosphatase: 79 IU/L (ref 44–121)
BUN/Creatinine Ratio: 34 — ABNORMAL HIGH (ref 12–28)
BUN: 25 mg/dL (ref 8–27)
Bilirubin Total: 0.6 mg/dL (ref 0.0–1.2)
CO2: 26 mmol/L (ref 20–29)
Calcium: 9.5 mg/dL (ref 8.7–10.3)
Chloride: 99 mmol/L (ref 96–106)
Creatinine, Ser: 0.73 mg/dL (ref 0.57–1.00)
Globulin, Total: 2.4 g/dL (ref 1.5–4.5)
Glucose: 103 mg/dL — ABNORMAL HIGH (ref 70–99)
Potassium: 3.3 mmol/L — ABNORMAL LOW (ref 3.5–5.2)
Sodium: 143 mmol/L (ref 134–144)
Total Protein: 6.8 g/dL (ref 6.0–8.5)
eGFR: 81 mL/min/{1.73_m2} (ref >59)

## 2024-01-21 LAB — LIPID PANEL
Chol/HDL Ratio: 2.8 ratio (ref 0.0–4.4)
Cholesterol, Total: 163 mg/dL (ref 100–199)
HDL: 59 mg/dL (ref >39)
LDL Chol Calc (NIH): 86 mg/dL (ref 0–99)
Triglycerides: 96 mg/dL (ref 0–149)
VLDL Cholesterol Cal: 18 mg/dL (ref 5–40)

## 2024-01-26 ENCOUNTER — Ambulatory Visit: Payer: Self-pay

## 2024-01-26 DIAGNOSIS — E876 Hypokalemia: Secondary | ICD-10-CM

## 2024-01-26 DIAGNOSIS — Z79899 Other long term (current) drug therapy: Secondary | ICD-10-CM

## 2024-01-26 DIAGNOSIS — M545 Low back pain, unspecified: Secondary | ICD-10-CM | POA: Diagnosis not present

## 2024-01-26 DIAGNOSIS — I10 Essential (primary) hypertension: Secondary | ICD-10-CM

## 2024-01-26 MED ORDER — POTASSIUM CHLORIDE CRYS ER 20 MEQ PO TBCR: 40.0000 meq | EXTENDED_RELEASE_TABLET | Freq: Two times a day (BID) | ORAL | 0 refills | Status: DC

## 2024-01-26 NOTE — Telephone Encounter (Signed)
-----   Message from Gaylyn Keas sent at 01/21/2024  8:05 PM EDT ----- Lipids still not at goal.  Please forward to lipid clinic for further recommendations.  Potassium low - stop OTC potassium and start Kdur 40meq PO daily.  check Mag and BMET in 1 week

## 2024-01-26 NOTE — Telephone Encounter (Signed)
 Call to patient to advise Lipids still not at goal.  No answer, left detailed message per DPR that Dr. Micael Adas ordering lipid clinic for further recommendations.  Also advised that Potassium low. Dr. Micael Adas advises to stop OTC potassium and start Kdur 40meq PO daily.  Explained that KDUR is a prescription and no other potassium supplements should be taken while taking KDUR. Advised I would call Kdur to her pharmacy of record and I would also put in lab orders to check Mag and BMET in 1 week. Asked patient to call our office if any questions, also sent [facility] message.

## 2024-01-31 ENCOUNTER — Other Ambulatory Visit: Payer: Self-pay

## 2024-01-31 ENCOUNTER — Other Ambulatory Visit (HOSPITAL_COMMUNITY): Payer: Self-pay

## 2024-01-31 ENCOUNTER — Telehealth: Payer: Self-pay

## 2024-01-31 DIAGNOSIS — E876 Hypokalemia: Secondary | ICD-10-CM

## 2024-01-31 DIAGNOSIS — E785 Hyperlipidemia, unspecified: Secondary | ICD-10-CM

## 2024-01-31 MED ORDER — POTASSIUM CHLORIDE CRYS ER 20 MEQ PO TBCR
40.0000 meq | EXTENDED_RELEASE_TABLET | Freq: Every day | ORAL | 0 refills | Status: DC
  Filled 2024-01-31: qty 14, 7d supply, fill #0

## 2024-01-31 NOTE — Telephone Encounter (Signed)
 Kdur order instructions updated to "40 MEq daily" instead of BID, discussed with patient over MyChart.

## 2024-01-31 NOTE — Progress Notes (Signed)
 Order for lipid clinic placed per [facility] message.

## 2024-02-03 DIAGNOSIS — M48062 Spinal stenosis, lumbar region with neurogenic claudication: Secondary | ICD-10-CM | POA: Diagnosis not present

## 2024-02-03 DIAGNOSIS — M545 Low back pain, unspecified: Secondary | ICD-10-CM | POA: Diagnosis not present

## 2024-02-03 DIAGNOSIS — M4316 Spondylolisthesis, lumbar region: Secondary | ICD-10-CM | POA: Diagnosis not present

## 2024-02-04 ENCOUNTER — Other Ambulatory Visit: Payer: Self-pay | Admitting: Cardiology

## 2024-02-07 ENCOUNTER — Encounter: Payer: Self-pay | Admitting: Podiatry

## 2024-02-07 ENCOUNTER — Ambulatory Visit: Admitting: Podiatry

## 2024-02-07 DIAGNOSIS — E785 Hyperlipidemia, unspecified: Secondary | ICD-10-CM | POA: Diagnosis not present

## 2024-02-07 DIAGNOSIS — G5781 Other specified mononeuropathies of right lower limb: Secondary | ICD-10-CM | POA: Diagnosis not present

## 2024-02-07 MED ORDER — TRIAMCINOLONE ACETONIDE 40 MG/ML IJ SUSP
20.0000 mg | Freq: Once | INTRAMUSCULAR | Status: AC
  Administered 2024-02-07: 20 mg

## 2024-02-07 NOTE — Progress Notes (Unsigned)
 Subjective:   Patient ID: Teresa Rangel, female   DOB: 83 y.o.   MRN: [id number]   HPI ***   ROS      Objective:  Physical Exam  ***     Assessment:  ***     Plan:  ***

## 2024-02-08 ENCOUNTER — Ambulatory Visit: Payer: Self-pay | Admitting: Pharmacist Clinician (PhC)/ Clinical Pharmacy Specialist

## 2024-02-08 ENCOUNTER — Other Ambulatory Visit: Payer: Self-pay

## 2024-02-08 LAB — HEPATIC FUNCTION PANEL
ALT: 17 IU/L (ref 0–32)
AST: 23 IU/L (ref 0–40)
Albumin: 4.3 g/dL (ref 3.7–4.7)
Alkaline Phosphatase: 84 IU/L (ref 44–121)
Bilirubin Total: 0.6 mg/dL (ref 0.0–1.2)
Bilirubin, Direct: 0.19 mg/dL (ref 0.00–0.40)
Total Protein: 6.4 g/dL (ref 6.0–8.5)

## 2024-02-08 LAB — LIPID PANEL
Chol/HDL Ratio: 2.6 ratio (ref 0.0–4.4)
Cholesterol, Total: 157 mg/dL (ref 100–199)
HDL: 61 mg/dL (ref >39)
LDL Chol Calc (NIH): 79 mg/dL (ref 0–99)
Triglycerides: 95 mg/dL (ref 0–149)
VLDL Cholesterol Cal: 17 mg/dL (ref 5–40)

## 2024-02-08 MED ORDER — CHLORTHALIDONE 25 MG PO TABS
25.0000 mg | ORAL_TABLET | Freq: Every day | ORAL | 3 refills | Status: DC
Start: 2024-02-08 — End: 2024-03-26

## 2024-02-08 MED ORDER — AMLODIPINE BESYLATE 10 MG PO TABS
10.0000 mg | ORAL_TABLET | Freq: Every day | ORAL | 3 refills | Status: DC
Start: 2024-02-08 — End: 2024-08-03

## 2024-02-08 NOTE — Progress Notes (Signed)
 She presents today complaining of pain to the 2nd and 3rd interdigital spaces she states that the pain is constant started about 2 days ago with no trauma.  Objective: Pulses remain palpable there is no erythema edema cellulitis drainage or odor she has pain on palpation to the third interdigital space of the right foot with radiating pains to the toes approximately.  Assessment: Neuroma third interdigital space.  Plan: Injected the area today with 10 mg of Kenalog  5 mg Marcaine.  Discussed appropriate shoe gear.  Should this fail to render her asymptomatic she will notify us  immediately.

## 2024-02-10 ENCOUNTER — Other Ambulatory Visit (HOSPITAL_COMMUNITY): Payer: Self-pay

## 2024-02-23 DIAGNOSIS — M545 Low back pain, unspecified: Secondary | ICD-10-CM | POA: Diagnosis not present

## 2024-02-27 DIAGNOSIS — H353221 Exudative age-related macular degeneration, left eye, with active choroidal neovascularization: Secondary | ICD-10-CM | POA: Diagnosis not present

## 2024-02-29 ENCOUNTER — Ambulatory Visit: Admitting: Nurse Practitioner

## 2024-02-29 ENCOUNTER — Other Ambulatory Visit: Payer: Self-pay | Admitting: Physician Assistant

## 2024-02-29 ENCOUNTER — Encounter: Payer: Self-pay | Admitting: Nurse Practitioner

## 2024-02-29 ENCOUNTER — Encounter: Payer: Self-pay | Admitting: Physician Assistant

## 2024-02-29 VITALS — BP 112/68 | HR 72

## 2024-02-29 DIAGNOSIS — J45991 Cough variant asthma: Secondary | ICD-10-CM

## 2024-02-29 DIAGNOSIS — N390 Urinary tract infection, site not specified: Secondary | ICD-10-CM | POA: Diagnosis not present

## 2024-02-29 NOTE — Progress Notes (Signed)
 @Patient  ID: Teresa Rangel, female    DOB: [birth year], 83 y.o.   MRN: [id number]  Chief Complaint  Patient presents with   Follow-up    Breathing is better ACT 25     Referring provider: Allwardt, Deleta Felix, PA-C  HPI: 83 year old female, never smoker followed for asthma and chronic cough. She is a patient of Dr. Cari Char and last seen in office 01/10/2024 by [facility] NP. Past medical history significant for HTN, OSA, hypothyroid, anxiety, depression, HLD, insomnia.   TEST/EVENTS:  08/29/2022 CXR: no acute process  02/25/2022: OV with Dr. Marygrace Snellen. Hx of recurrent bronchitis, cough with seasonal changes and pollen as well as URI 1-3 times a year. Treated numerous times with azithromycin  and some prednisone . Improved with Breo. Continue high dose Breo 1 puff daily. Instructed to continued and utilize albuterol  PRN - has not needed in last several months. Postnasal drip likely contributing to cough. Prior chest imaging clear.   01/10/2024: Teresa Rangel with Teresa Albertsen NP Discussed the use of AI scribe software for clinical note transcription with the patient, who gave verbal consent to proceed. Teresa Rangel is an 83 year old female with history of recurrent bronchitis and treated for asthma who presents with increased coughing and hoarseness after running out of Breo. She has experienced increased coughing and hoarseness after running out of her Breo inhaler one to two months ago. She has a history of recurrent bronchitis and has been using Breo for management, which has effectively prevented bronchitis episodes for the past two years. No recent use of steroids or antibiotics since starting Breo. She experiences possible allergy symptoms, including postnasal drainage and hoarseness, but is not currently taking any allergy medications. She has Flonase  nasal spray but has not used it recently. This has helped in the past.  She sometimes gets up clear phlegm but usually dry. Has more throat  clearing than actual cough. No wheezing or shortness of breath.  She has not used her albuterol  inhaler in over a year, and it is likely expired.  No fever, chills, or hemoptysis. Her appetite remains good, though she mentions eating 'too much' recently.  FeNO 10 ppb   02/29/2024: Today - follow up Discussed the use of AI scribe software for clinical note transcription with the patient, who gave verbal consent to proceed.  History of Present Illness   Teresa Rangel is an 83 year old female who presents for follow-up after resuming Breo therapy.  She feels great and notes significant improvement in her breathing since resuming Breo therapy. No current coughing is present.  She had previously run out of [place], which led to increased respiratory difficulties. Since restarting the medication, her symptoms have improved.  There have been no issues obtaining Breo, although the cost is approximately $87. She can afford this but would be nice if it was less.       Allergies  Allergen Reactions   Molds & Smuts     Other reaction(s): cough   Tetracyclines & Related     Stomach cramps    Immunization History  Administered Date(s) Administered   Fluad Quad(high Dose 65+) 05/28/2021, 07/01/2022   Influenza, High Dose Seasonal PF 08/04/2019   PFIZER(Purple Top)SARS-COV-2 Vaccination 10/22/2019, 11/16/2019   Pneumococcal Conjugate-13 07/07/2018   Pneumococcal Polysaccharide-23 06/07/2013   Tdap 05/23/2023   Tetanus 06/07/2013   Zoster Recombinant(Shingrix) 04/12/2022    Past Medical History:  Diagnosis Date   History of stomach ulcers 1980   Hyperlipidemia  Hypertension    Hypothyroidism    Macular degeneration of left eye    OSA (obstructive sleep apnea)    mild obstructive sleep apnea with an AHI of 10/hr.  Auto CPAP from 4 to 15 cm H2O   Renal artery stenosis (HCC)    right >60% and left on upper end of 1-59% by dopplers 1/24   Tumor of thyroid  08/23/2019   Benign      Tobacco History: Social History   Tobacco Use  Smoking Status Never  Smokeless Tobacco Never   Counseling given: Not Answered   Outpatient Medications Prior to Visit  Medication Sig Dispense Refill   albuterol  (VENTOLIN  HFA) 108 (90 Base) MCG/ACT inhaler Inhale 1-2 puffs into the lungs every 6 (six) hours as needed for wheezing or shortness of breath. 8 g 2   amLODipine  (NORVASC ) 10 MG tablet Take 1 tablet (10 mg total) by mouth daily. 90 tablet 3   carvedilol  (COREG ) 6.25 MG tablet TAKE 1 TABLET BY MOUTH 2 TIMES A DAY 90 tablet 3   chlorthalidone  (HYGROTON ) 25 MG tablet Take 1 tablet (25 mg total) by mouth daily. 90 tablet 3   FLUoxetine  (PROZAC ) 20 MG capsule Take 1 capsule (20 mg total) by mouth every morning. 90 capsule 3   fluticasone  (FLONASE ) 50 MCG/ACT nasal spray Place 2 sprays into both nostrils daily. 16 g 6   fluticasone  furoate-vilanterol (BREO ELLIPTA ) 200-25 MCG/ACT AEPB Inhale 1 puff into the lungs daily. 60 each 5   levothyroxine  (SYNTHROID ) 88 MCG tablet TAKE 1 TABLET BY MOUTH DAILY BEFORE BREAKFAST 90 tablet 0   Multiple Vitamins-Minerals (PRESERVISION AREDS 2 PO) Take 1 tablet by mouth daily.     potassium chloride  SA (KLOR-CON  M) 20 MEQ tablet Take 2 tablets (40 mEq total) by mouth daily. 14 tablet 0   pravastatin  (PRAVACHOL ) 80 MG tablet Take 1 tablet (80 mg total) by mouth every evening. 90 tablet 1   TURMERIC PO Take 1 tablet by mouth daily.     No facility-administered medications prior to visit.     Review of Systems:   Constitutional: No weight loss or gain, night sweats, fevers, chills, fatigue, or lassitude. HEENT: No headaches, difficulty swallowing, tooth/dental problems, or sore throat. No sneezing, itching, ear ache, nasal congestion, post nasal drip, hoarseness, throat clearing  CV:  No chest pain, orthopnea, PND, swelling in lower extremities, anasarca, dizziness, palpitations, syncope Resp: No shortness of breath with exertion or at rest. No  excess mucus or change in color of mucus. No cough. No hemoptysis. No wheezing.  No chest wall deformity GI:  No heartburn, indigestion, abdominal pain, nausea, vomiting, diarrhea, change in bowel habits, loss of appetite, bloody stools.  Skin: No rash, lesions, ulcerations MSK:  No joint pain or swelling.   Neuro: No dizziness or lightheadedness.  Psych: No depression or anxiety. Mood stable.     Physical Exam:  BP 112/68 (BP Location: Right Arm, Patient Position: Sitting, Cuff Size: Normal)   Pulse 72   LMP  (LMP Unknown)   SpO2 98%   GEN: Pleasant, interactive, well-appearing; in no acute distress HEENT:  Normocephalic and atraumatic. PERRLA. Sclera [place]. Nasal turbinates pink, moist and patent bilaterally. No rhinorrhea present. Oropharynx pink and moist, without exudate or edema. No lesions, ulcerations.  NECK:  Supple w/ fair ROM. No JVD present. Normal carotid impulses w/o bruits. Thyroid  symmetrical with no goiter or nodules palpated. No lymphadenopathy.   CV: RRR, no m/r/g, no peripheral edema. Pulses intact, +2 bilaterally.  No cyanosis, pallor or clubbing. PULMONARY:  Unlabored, regular breathing. Clear bilaterally A&P w/o wheezes/rales/rhonchi. No accessory muscle use.  GI: BS present and normoactive. Soft, non-tender to palpation. No organomegaly or masses detected.  MSK: No erythema, warmth or tenderness. Cap refil <2 sec all extrem. No deformities or joint swelling noted.  Neuro: A/Ox3. No focal deficits noted.   Skin: Warm, no lesions or rashe Psych: Normal affect and behavior. Judgement and thought content appropriate.     Lab Results:  CBC    Component Value Date/Time   WBC 8.2 08/28/2022 2239   RBC 5.20 (H) 08/28/2022 2239   HGB 14.9 08/28/2022 2239   HCT 42.7 08/28/2022 2239   PLT 240 08/28/2022 2239   MCV 82.1 08/28/2022 2239   MCH 28.7 08/28/2022 2239   MCHC 34.9 08/28/2022 2239   RDW 13.0 08/28/2022 2239   LYMPHSABS 1.7 08/17/2022 1537   MONOABS 0.6  08/17/2022 1537   EOSABS 0.3 08/17/2022 1537   BASOSABS 0.0 08/17/2022 1537    BMET    Component Value Date/Time   NA 143 01/20/2024 0853   K 3.3 (L) 01/20/2024 0853   CL 99 01/20/2024 0853   CO2 26 01/20/2024 0853   GLUCOSE 103 (H) 01/20/2024 0853   GLUCOSE 113 (H) 08/28/2022 2239   BUN 25 01/20/2024 0853   CREATININE 0.73 01/20/2024 0853   CREATININE 0.74 06/04/2020 1215   CALCIUM  9.5 01/20/2024 0853   GFRNONAA >60 08/28/2022 2239   GFRNONAA 77 06/04/2020 1215   GFRAA 89 06/04/2020 1215    BNP No results found for: BNP   Imaging:  No results found.  triamcinolone  acetonide (KENALOG -40) injection 20 mg     Date Action Dose Route User   02/07/2024 1416 Given 20 mg Other Prevette, Rolinda Climes, PMAC           No data to display          Lab Results  Component Value Date   NITRICOXIDE 10 01/10/2024        Assessment & Plan:   Cough variant asthma Concern for asthma with recurrent episodes of bronchitis and environmental triggers. She has been well managed on ICS/LABA with Breo. Resolved acute symptoms since restarting Breo. Action plan in place.   Patient Instructions  Continue Albuterol  inhaler 2 puffs every 6 hours as needed for shortness of breath or wheezing. Notify if symptoms persist despite rescue inhaler/neb use.  Continue Breo 1 puff daily. Brush tongue and rinse mouth afterwards Continue flonase  nasal spray 2 sprays each nostril daily   Follow up in 1 year with Dr. Marygrace Snellen or Katie Lia Vigilante,NP. If symptoms do not improve or worsen, please contact office for sooner follow up or seek emergency care.    Advised if symptoms do not improve or worsen, to please contact office for sooner follow up or seek emergency care.   I spent 25 minutes of dedicated to the care of this patient on the date of this encounter to include pre-visit review of records, face-to-face time with the patient discussing conditions above, post visit ordering of testing,  clinical documentation with the electronic health record, making appropriate referrals as documented, and communicating necessary findings to members of the patients care team.  Roetta Clarke, NP 02/29/2024  Pt aware and understands NP's role.

## 2024-02-29 NOTE — Patient Instructions (Signed)
 Continue Albuterol  inhaler 2 puffs every 6 hours as needed for shortness of breath or wheezing. Notify if symptoms persist despite rescue inhaler/neb use.  Continue Breo 1 puff daily. Brush tongue and rinse mouth afterwards Continue flonase  nasal spray 2 sprays each nostril daily   Follow up in 1 year with Dr. Marygrace Snellen or Katie Ogle Hoeffner,NP. If symptoms do not improve or worsen, please contact office for sooner follow up or seek emergency care.

## 2024-02-29 NOTE — Assessment & Plan Note (Signed)
 Concern for asthma with recurrent episodes of bronchitis and environmental triggers. She has been well managed on ICS/LABA with Breo. Resolved acute symptoms since restarting Breo. Action plan in place.   Patient Instructions  Continue Albuterol  inhaler 2 puffs every 6 hours as needed for shortness of breath or wheezing. Notify if symptoms persist despite rescue inhaler/neb use.  Continue Breo 1 puff daily. Brush tongue and rinse mouth afterwards Continue flonase  nasal spray 2 sprays each nostril daily   Follow up in 1 year with Dr. Marygrace Snellen or Katie Julena Barbour,NP. If symptoms do not improve or worsen, please contact office for sooner follow up or seek emergency care.

## 2024-03-01 MED ORDER — LEVOTHYROXINE SODIUM 88 MCG PO TABS: 88.0000 ug | ORAL_TABLET | Freq: Every day | ORAL | 0 refills | Status: DC

## 2024-03-04 ENCOUNTER — Encounter: Payer: Self-pay | Admitting: Physician Assistant

## 2024-03-05 NOTE — Telephone Encounter (Signed)
 Please see pt msg and advise on recommendations for patient and if you would like her to schedule an OV to discuss more.

## 2024-03-06 DIAGNOSIS — M545 Low back pain, unspecified: Secondary | ICD-10-CM | POA: Diagnosis not present

## 2024-03-08 DIAGNOSIS — M545 Low back pain, unspecified: Secondary | ICD-10-CM | POA: Diagnosis not present

## 2024-03-12 DIAGNOSIS — M545 Low back pain, unspecified: Secondary | ICD-10-CM | POA: Diagnosis not present

## 2024-03-23 ENCOUNTER — Encounter: Payer: Self-pay | Admitting: Physician Assistant

## 2024-03-23 ENCOUNTER — Ambulatory Visit (INDEPENDENT_AMBULATORY_CARE_PROVIDER_SITE_OTHER): Admitting: Physician Assistant

## 2024-03-23 ENCOUNTER — Other Ambulatory Visit: Payer: Self-pay

## 2024-03-23 ENCOUNTER — Telehealth: Payer: Self-pay | Admitting: Internal Medicine

## 2024-03-23 ENCOUNTER — Ambulatory Visit: Payer: Self-pay | Admitting: Physician Assistant

## 2024-03-23 VITALS — BP 140/84 | HR 56 | Temp 97.8°F | Ht 64.0 in | Wt 157.2 lb

## 2024-03-23 DIAGNOSIS — I10 Essential (primary) hypertension: Secondary | ICD-10-CM

## 2024-03-23 DIAGNOSIS — K921 Melena: Secondary | ICD-10-CM | POA: Diagnosis not present

## 2024-03-23 DIAGNOSIS — E039 Hypothyroidism, unspecified: Secondary | ICD-10-CM

## 2024-03-23 DIAGNOSIS — Z8639 Personal history of other endocrine, nutritional and metabolic disease: Secondary | ICD-10-CM

## 2024-03-23 DIAGNOSIS — Z800 Family history of malignant neoplasm of digestive organs: Secondary | ICD-10-CM

## 2024-03-23 DIAGNOSIS — R194 Change in bowel habit: Secondary | ICD-10-CM

## 2024-03-23 DIAGNOSIS — E876 Hypokalemia: Secondary | ICD-10-CM | POA: Diagnosis not present

## 2024-03-23 DIAGNOSIS — R42 Dizziness and giddiness: Secondary | ICD-10-CM

## 2024-03-23 LAB — IBC + FERRITIN
Ferritin: 25.9 ng/mL (ref 10.0–291.0)
Iron: 112 ug/dL (ref 42–145)
Saturation Ratios: 27.3 % (ref 20.0–50.0)
TIBC: 410.2 ug/dL (ref 250.0–450.0)
Transferrin: 293 mg/dL (ref 212.0–360.0)

## 2024-03-23 LAB — CBC WITH DIFFERENTIAL/PLATELET
Basophils Absolute: 0.1 K/uL (ref 0.0–0.1)
Basophils Relative: 0.9 % (ref 0.0–3.0)
Eosinophils Absolute: 0.4 K/uL (ref 0.0–0.7)
Eosinophils Relative: 5.4 % — ABNORMAL HIGH (ref 0.0–5.0)
HCT: 43.1 % (ref 36.0–46.0)
Hemoglobin: 14.6 g/dL (ref 12.0–15.0)
Lymphocytes Relative: 20.6 % (ref 12.0–46.0)
Lymphs Abs: 1.4 K/uL (ref 0.7–4.0)
MCHC: 33.8 g/dL (ref 30.0–36.0)
MCV: 86 fl (ref 78.0–100.0)
Monocytes Absolute: 0.6 K/uL (ref 0.1–1.0)
Monocytes Relative: 8.8 % (ref 3.0–12.0)
Neutro Abs: 4.2 K/uL (ref 1.4–7.7)
Neutrophils Relative %: 64.3 % (ref 43.0–77.0)
Platelets: 245 K/uL (ref 150.0–400.0)
RBC: 5.02 Mil/uL (ref 3.87–5.11)
RDW: 13.9 % (ref 11.5–15.5)
WBC: 6.6 K/uL (ref 4.0–10.5)

## 2024-03-23 LAB — COMPREHENSIVE METABOLIC PANEL WITH GFR
ALT: 15 U/L (ref 0–35)
AST: 20 U/L (ref 0–37)
Albumin: 4.3 g/dL (ref 3.5–5.2)
Alkaline Phosphatase: 62 U/L (ref 39–117)
BUN: 19 mg/dL (ref 6–23)
CO2: 32 meq/L (ref 19–32)
Calcium: 9.4 mg/dL (ref 8.4–10.5)
Chloride: 99 meq/L (ref 96–112)
Creatinine, Ser: 0.71 mg/dL (ref 0.40–1.20)
GFR: 78.2 mL/min (ref >60.00)
Glucose, Bld: 102 mg/dL — ABNORMAL HIGH (ref 70–99)
Potassium: 3.3 meq/L — ABNORMAL LOW (ref 3.5–5.1)
Sodium: 139 meq/L (ref 135–145)
Total Bilirubin: 0.9 mg/dL (ref 0.2–1.2)
Total Protein: 7.1 g/dL (ref 6.0–8.3)

## 2024-03-23 LAB — TSH: TSH: 1.7 u[IU]/mL (ref 0.35–5.50)

## 2024-03-23 MED ORDER — POTASSIUM CHLORIDE CRYS ER 20 MEQ PO TBCR: 40.0000 meq | EXTENDED_RELEASE_TABLET | Freq: Every day | ORAL | 0 refills | Status: DC

## 2024-03-23 NOTE — Telephone Encounter (Signed)
 Called and spoke with patient. She reports that her bowels seem normal, but she has to go multiple times every morning before she feels like she has evacuated her bowels completely. She does report blood on the toilet paper but denies seeing blood in her stool. Appointment scheduled with Harlene for 04/06/2024 at 1330 to discuss further.

## 2024-03-23 NOTE — Telephone Encounter (Signed)
 Inbound call from patient stating she is having blood in her stool and change in bowel habits. Stated she was advised by her primary care to be scheduled for a colonoscopy as soon as possible. Patient is requesting to discuss options sooner than September. Please advise, thank you

## 2024-03-23 NOTE — Progress Notes (Unsigned)
 Patient ID: Teresa Rangel, female    DOB: December 23, 1940, 83 y.o.   MRN: [id number]   Assessment & Plan:  There are no diagnoses linked to this encounter.    Assessment and Plan Assessment & Plan Hemorrhoids Chronic hemorrhoids with intermittent bleeding and discomfort. She uses over-the-counter treatments and a probiotic with some relief. Concerns about potential colon cancer due to family history and changes in bowel habits, including occasional blood in stool and altered stool caliber. Direct visualization via colonoscopy is necessary due to symptoms and family history, despite age-related recommendations against routine colonoscopy after 75. - Refer to gastroenterology for evaluation and discussion of colonoscopy risks and benefits. - Perform a physical examination to assess hemorrhoids. - Order CBC and ferritin to evaluate hemoglobin levels and iron storage.  Dizziness Intermittent dizziness, possibly related to dehydration and hypotension. Episodes are associated with inadequate hydration and low blood pressure readings. Previous low potassium levels in May may contribute to symptoms. Potential orthostatic hypotension due to antihypertensive overmedication. Emphasized hydration and blood pressure monitoring to prevent dizziness and falls. - Perform orthostatic blood pressure measurements to assess for orthostatic hypotension. - Order CMP to evaluate electrolyte levels, including potassium. - Instruct her to monitor blood pressure at home twice daily. - Discuss potential adjustment of antihypertensive medications with cardiologist if orthostatic hypotension is confirmed.  Hypertension Hypertension managed with multiple medications. Concerns about overmedication leading to hypotension and dizziness. She is advised to monitor blood pressure regularly and report consistently low readings. Potential reduction of antihypertensive medication if orthostatic hypotension is confirmed. -  Instruct her to monitor blood pressure at home twice daily. - Discuss potential adjustment of antihypertensive medications with cardiologist if orthostatic hypotension is confirmed.      No follow-ups on file.    Subjective:    Chief Complaint  Patient presents with   Medical Management of Chronic Issues    Pt in office to discuss completing colonoscopy; sister passed away from colon cancer and pt admits hemorrhoids, pt states in the mornings she is constantly going to the bathroom for 2-3 hours.  Last screening 2014 was normal and told didn't need to come back. Pt is also reporting nausea and dizziness recently    HPI Discussed the use of AI scribe software for clinical note transcription with the patient, who gave verbal consent to proceed.  History of Present Illness Teresa Rangel is an 83 year old female with hypertension and hypothyroidism who presents with hemorrhoids and dizziness.  She experiences severe hemorrhoids with occasional bleeding. She uses over-the-counter treatments, including creams and Vaseline, twice daily. Her bowel movements are frequent in the morning, requiring multiple trips to the bathroom, and sometimes include bright red blood. She has a family history of colon cancer, as her sister was diagnosed with it. Her last colonoscopy in 2014 was normal, but she is concerned about changes in her bowel habits and the possibility of colon cancer.  She experiences dizzy spells, which she associates with dehydration. She uses Liquid IV for hydration but sometimes forgets to take it. She describes an episode where she felt dizzy and nauseous after not hydrating properly, leading to a fall that resulted in a knocked-out front tooth. Her blood pressure was low during this episode. She has a history of hypertension and is on multiple medications, including pravastatin , which she stopped due to adverse gastrointestinal effects. She is concerned that her dizziness might  be related to her blood pressure medication or low potassium levels, as her  potassium was noted to be low in May.  Her past medical history includes hypertension, hypothyroidism, and a history of back issues with stenosis, which have been managed by an orthopedic specialist. Her thyroid  medication was adjusted to 88 mcg, which she feels is working well. She also mentions a previous episode of high blood pressure and anxiety, for which she was prescribed fluoxetine  20 mg, which she continues to take.  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 No chest pain, throat pain, or significant stomach pain. Reports occasional headaches, particularly after a fall, but no persistent pain. No recent episodes of bleeding in the past month or two.     Past Medical History:  Diagnosis Date   History of stomach ulcers 1980   Hyperlipidemia    Hypertension    Hypothyroidism    Macular degeneration of left eye    OSA (obstructive sleep apnea)    mild obstructive sleep apnea with an AHI of 10/hr.  Auto CPAP from 4 to 15 cm H2O   Renal artery stenosis (HCC)    right >60% and left on upper end of 1-59% by dopplers 1/24   Tumor of thyroid  08/23/2019   Benign     Past Surgical History:  Procedure Laterality Date   APPENDECTOMY  1954   COLONOSCOPY     LAPAROSCOPY  1982   THYROIDECTOMY  2007   TONSILLECTOMY  1949    Family History  Problem Relation Age of Onset   Breast cancer Maternal Aunt 33    Social History   Tobacco Use   Smoking status: Never   Smokeless tobacco: Never  Vaping Use   Vaping status: Never Used  Substance Use Topics   Alcohol use: Not Currently    Alcohol/week: 1.0 standard drink of alcohol    Types: 1 Glasses of wine per week    Comment: occassional   Drug use: No     Allergies  Allergen Reactions   Molds & Smuts Cough    Other reaction(s): cough   Tetracyclines & Related     Stomach cramps    Review of  Systems NEGATIVE UNLESS OTHERWISE INDICATED IN HPI      Objective:     BP (!) 140/84 (BP Location: Right Arm, Patient Position: Sitting, Cuff Size: Normal)   Pulse (!) 56   Temp 97.8 F (36.6 C) (Temporal)   Ht 5' 4 (1.626 m)   Wt 157 lb 3.2 oz (71.3 kg)   LMP  (LMP Unknown)   SpO2 99%   BMI 26.98 kg/m   Wt Readings from Last 3 Encounters:  03/23/24 157 lb 3.2 oz (71.3 kg)  01/20/24 156 lb 6.4 oz (70.9 kg)  01/10/24 160 lb 9.6 oz (72.8 kg)    BP Readings from Last 3 Encounters:  03/23/24 (!) 140/84  02/29/24 112/68  01/20/24 (!) 140/70     Physical Exam          Hektor Huston M Advik Weatherspoon, PA-C

## 2024-03-26 ENCOUNTER — Ambulatory Visit: Attending: Cardiovascular Disease | Admitting: Pharmacist

## 2024-03-26 ENCOUNTER — Encounter: Payer: Self-pay | Admitting: Pharmacist

## 2024-03-26 ENCOUNTER — Other Ambulatory Visit: Payer: Self-pay | Admitting: Physician Assistant

## 2024-03-26 ENCOUNTER — Ambulatory Visit: Admitting: Physician Assistant

## 2024-03-26 DIAGNOSIS — Z1231 Encounter for screening mammogram for malignant neoplasm of breast: Secondary | ICD-10-CM

## 2024-03-26 DIAGNOSIS — I10 Essential (primary) hypertension: Secondary | ICD-10-CM | POA: Diagnosis not present

## 2024-03-26 DIAGNOSIS — E785 Hyperlipidemia, unspecified: Secondary | ICD-10-CM

## 2024-03-26 MED ORDER — IRBESARTAN 75 MG PO TABS: 75.0000 mg | ORAL_TABLET | Freq: Every day | ORAL | 3 refills | Status: AC

## 2024-03-26 NOTE — Assessment & Plan Note (Addendum)
 Assessment: Last LDL-C 79 which is close to goal of less than 70 She is on pravastatin  80 mg daily Had diarrhea to rosuvastatin  20 mg daily Currently having some GI issues-was referred to GI With age she would prefer not to be as aggressive  Plan: Continue pravastatin  80 mg daily

## 2024-03-26 NOTE — Patient Instructions (Addendum)
 Start exercising on a regular basis STOP chlorthalidone   START Irbesartan  Come for lab work in 1-2 weeks  Please send me blood pressure readings in a few weeks

## 2024-03-26 NOTE — Assessment & Plan Note (Signed)
 Assessment: Patient reporting dizziness Dizziness does not just occur upon changing position-patient states it feels like dehydration Labs showed low potassium-most likely culprit is chlorthalidone  Blood pressure does vary and patient reports 140s being fairly good for patient Previously on irbesartan  300 which was decreased to 75 mg.  At some point this was stopped but looks more due to miscommunication and not adverse effect  Plan Stop chlorthalidone  Start irbesartan  75 mg daily BMP in 1 to 2 weeks Patient to send me blood pressure readings in a few weeks

## 2024-03-26 NOTE — Progress Notes (Signed)
 Patient ID: Teresa Rangel                 DOB: [birth year]                    MRN: [id number]      HPI: Kanoe Wanner is a 83 y.o. female patient referred to lipid clinic by Dr. Shlomo. PMH is significant for HTN, RAS Bilateral, 50% - R, 40% - L , hypothyroidism and OSA. Previously seen by Kristin. Her pravastatin  was increased to 80mg  (80mg  alternating with 40mg ) but not much improvement in LDL-C seen. She was switched to rosuvastatin , but this caused diarrhea.   She also recently saw her primary care doctor for dizziness.  Found to have low potassium which has been persistent since May.  She also had some orthostatics.  On chlorthalidone  25 mg daily along with carvedilol  6.25 mg twice a day and amlodipine  10 mg daily.  She was previously on irbesartan  75 mg but looks like this is fallen off her med list.  Patient denies taking.  Per fill history has not been taking.  She denies any dizziness this morning but has also not taken her blood pressure medication because she has not eaten.  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  We discussed options for her blood pressure including decreasing chlorthalidone  to 1/2 tablet daily versus switching to hydrochlorothiazide  versus stopping chlorthalidone  altogether and replacing with irbesartan  75 mg daily.  This last option would be the best in order to avoid needing potassium supplementation.  She does admit that her blood pressure varies.  She does not exercise as much as she knows she should.  Does a exercise video a few times a week.  Current Medications: pravastatin  80mg  daily Intolerances: rosuvastatin  20mg  (diarrhea), ezetimibe  (diarrhea) Risk Factors: RAS LDL-C goal: <70 ApoB goal: Less than 80  Diet: Not discussed today  Exercise: stay fit video   Family History: both parents had hypertension, both siblings as well, sister deceased from cancer, brother   Social History:  Tobacco:never             Alcohol: rare  wine             Caffeine: coffee in the mornings -1 cup  Labs: Lipid Panel     Component Value Date/Time   CHOL 157 02/07/2024 0900   TRIG 95 02/07/2024 0900   HDL 61 02/07/2024 0900   CHOLHDL 2.6 02/07/2024 0900   CHOLHDL 3 02/02/2022 1029   VLDL 23.2 02/02/2022 1029   LDLCALC 79 02/07/2024 0900   LDLCALC 156 (H) 06/04/2020 1215   LDLDIRECT 133.1 12/07/2012 1049   LABVLDL 17 02/07/2024 0900    Past Medical History:  Diagnosis Date   History of stomach ulcers 1980   Hyperlipidemia    Hypertension    Hypothyroidism    Macular degeneration of left eye    OSA (obstructive sleep apnea)    mild obstructive sleep apnea with an AHI of 10/hr.  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 8 g 2   amLODipine  (NORVASC ) 10 MG tablet Take 1 tablet (10 mg total) by mouth daily. 90 tablet 3   carvedilol  (COREG ) 6.25 MG tablet TAKE 1 TABLET BY MOUTH 2 TIMES A DAY 90 tablet 3   FLUoxetine  (PROZAC ) 20 MG capsule Take 1 capsule (20 mg total) by mouth every morning. 90 capsule 3   fluticasone  (FLONASE ) 50 MCG/ACT nasal spray Place 2 sprays into both nostrils daily. 16 g 6   fluticasone  furoate-vilanterol (BREO ELLIPTA ) 200-25 MCG/ACT AEPB Inhale 1 puff into the lungs daily. 60 each 5   levothyroxine  (SYNTHROID ) 88 MCG tablet Take 1 tablet (88 mcg total) by mouth daily before breakfast. 90 tablet 0   Multiple Vitamins-Minerals (PRESERVISION AREDS 2 PO) Take 1 tablet by mouth daily.     pravastatin  (PRAVACHOL ) 80 MG tablet Take 1 tablet (80 mg total) by mouth every evening. 90 tablet 1   TURMERIC PO Take 1 tablet by mouth daily.     No  current facility-administered medications on file prior to visit.    Allergies  Allergen Reactions   Molds & Smuts Cough    Other reaction(s): cough   Tetracyclines & Related     Stomach cramps    Assessment/Plan:  1. Hyperlipidemia -  Essential hypertension Assessment: Patient reporting dizziness Dizziness does not just occur upon changing position-patient states it feels like dehydration Labs showed low potassium-most likely culprit is chlorthalidone  Blood pressure does vary and patient reports 140s being fairly good for patient Previously on irbesartan  300 which was decreased to 75 mg.  At some point this was stopped but looks more due to miscommunication and not adverse effect  Plan Stop chlorthalidone  Start irbesartan  75 mg daily BMP in 1 to 2 weeks Patient to send me blood pressure readings in a few weeks  Dyslipidemia Assessment: Last LDL-C 79 which is close to goal of less than 70 She is on pravastatin  80 mg daily Had diarrhea to rosuvastatin  20 mg daily Currently having some GI issues-was referred to GI With age she would prefer not to be as aggressive  Plan: Continue pravastatin  80 mg daily    Thank you,  Sabrea Sankey D Jackline Castilla, Pharm.JONETTA SARAN, CPP [facility] [address], [place], [place] [address]  Phone: [phone]; Fax: [phone]

## 2024-04-06 ENCOUNTER — Encounter: Payer: Self-pay | Admitting: Pharmacist

## 2024-04-06 ENCOUNTER — Ambulatory Visit: Admitting: Gastroenterology

## 2024-04-06 ENCOUNTER — Encounter: Payer: Self-pay | Admitting: Gastroenterology

## 2024-04-06 VITALS — BP 126/58 | HR 70 | Ht 64.0 in | Wt 158.0 lb

## 2024-04-06 DIAGNOSIS — R143 Flatulence: Secondary | ICD-10-CM | POA: Diagnosis not present

## 2024-04-06 DIAGNOSIS — K649 Unspecified hemorrhoids: Secondary | ICD-10-CM | POA: Insufficient documentation

## 2024-04-06 DIAGNOSIS — Z800 Family history of malignant neoplasm of digestive organs: Secondary | ICD-10-CM

## 2024-04-06 DIAGNOSIS — K644 Residual hemorrhoidal skin tags: Secondary | ICD-10-CM | POA: Diagnosis not present

## 2024-04-06 DIAGNOSIS — R194 Change in bowel habit: Secondary | ICD-10-CM

## 2024-04-06 MED ORDER — HYDROCORTISONE (PERIANAL) 2.5 % EX CREA: 1.0000 | TOPICAL_CREAM | Freq: Three times a day (TID) | CUTANEOUS | 1 refills | Status: DC

## 2024-04-06 MED ORDER — NA SULFATE-K SULFATE-MG SULF 17.5-3.13-1.6 GM/177ML PO SOLN
1.0000 | Freq: Once | ORAL | 0 refills | Status: AC
Start: 2024-04-06 — End: 2024-04-06

## 2024-04-06 NOTE — Progress Notes (Signed)
 04/06/2024 Teresa Rangel [id number] 12/25/1940   HISTORY OF PRESENT ILLNESS: This is an 83 year old female who is a patient of Dr. Darilyn.  She has not been seen here since 2017.  She is here today with a few different complaints.  First, she says that her hemorrhoids have really been an issue.  She has intermittent bright red rectal bleeding although it is not a lot, but they do cause intermittent rectal pain as well.  She says that she has used everything over-the-counter.  She complains of alternating bowel habits between constipation and diarrhea/loose stool.  She reports that she has to wear a pad in case she does not make it to the bathroom when having to have a bowel movement.  She reports having to go back and forth to the bathroom multiple times in the morning, usually 3 or 4 times before she can finally feel like she completely empties all of her stool.  Reports a lot of gas.  She tried align probiotic and that did not seem to help, but she has now started another one that she feels has helped some with the gas.  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  He saw Dr. Bernarda Ned for hemorrhoids back in 2017, but was told that they do not perform hemorrhoidectomies anymore because of possible complications.  She says that sometimes her stools have been very narrow.  Her sister had colon cancer diagnosed age 53 so she is just concerned about that as well.  She thinks she tried taking fiber supplements in the past, but feels like they possibly caused her more issues with gas.  Last colonoscopy was in June 2014 at which time study was normal.   Past Medical History:  Diagnosis Date   History of stomach ulcers 1980   Hyperlipidemia    Hypertension    Hypothyroidism    Macular degeneration of left eye    OSA (obstructive sleep apnea)    mild obstructive sleep apnea with an AHI of 10/hr.  Auto CPAP from 4 to 15 cm H2O   Renal artery  stenosis (HCC)    right >60% and left on upper end of 1-59% by dopplers 1/24   Tumor of thyroid  08/23/2019   Benign    Past Surgical History:  Procedure Laterality Date   APPENDECTOMY  1954   COLONOSCOPY     LAPAROSCOPY  1982   THYROIDECTOMY  2007   TONSILLECTOMY  1949    reports that she has never smoked. She has never used smokeless tobacco. She reports that she does not currently use alcohol after a past usage of about 1.0 standard drink of alcohol per week. She reports that she does not use drugs. family history includes Breast cancer (age of onset: 55) in her maternal aunt; Colon cancer in her sister. Allergies  Allergen Reactions   Molds & Smuts Cough    Other reaction(s): cough   Tetracyclines & Related     Stomach cramps      Outpatient Encounter Medications as of 04/06/2024  Medication Sig   albuterol  (VENTOLIN  HFA) 108 (90 Base) MCG/ACT inhaler Inhale 1-2 puffs into the lungs every 6 (six) hours as needed for wheezing or shortness of breath.   amLODipine  (NORVASC ) 10 MG tablet Take 1 tablet (10 mg total) by mouth daily.   carvedilol  (COREG ) 6.25 MG tablet TAKE 1 TABLET BY MOUTH 2 TIMES A DAY   FLUoxetine  (PROZAC )  20 MG capsule Take 1 capsule (20 mg total) by mouth every morning.   fluticasone  (FLONASE ) 50 MCG/ACT nasal spray Place 2 sprays into both nostrils daily.   fluticasone  furoate-vilanterol (BREO ELLIPTA ) 200-25 MCG/ACT AEPB Inhale 1 puff into the lungs daily.   irbesartan  (AVAPRO ) 75 MG tablet Take 1 tablet (75 mg total) by mouth daily.   levothyroxine  (SYNTHROID ) 88 MCG tablet Take 1 tablet (88 mcg total) by mouth daily before breakfast.   Multiple Vitamins-Minerals (PRESERVISION AREDS 2 PO) Take 1 tablet by mouth daily.   pravastatin  (PRAVACHOL ) 80 MG tablet Take 1 tablet (80 mg total) by mouth every evening.   TURMERIC PO Take 1 tablet by mouth daily.   No facility-administered encounter medications on file as of 04/06/2024.    REVIEW OF SYSTEMS  : All  other systems reviewed and negative except where noted in the History of Present Illness.   PHYSICAL EXAM: BP (!) 126/58   Pulse 70   Ht 5' 4 (1.626 m)   Wt 158 lb (71.7 kg)   LMP  (LMP Unknown)   BMI 27.12 kg/m  General: Well developed [place] female in no acute distress Head: Normocephalic and atraumatic Eyes:  Sclerae anicteric, conjunctiva pink. Ears: Normal auditory acuity Lungs: Clear throughout to auscultation; no W/R/R. Heart: Regular rate and rhythm; no M/R/G. Abdomen: Soft, non-distended.  BS present.  Non-tender. Rectal: Large external hemorrhoids noted.  DRE did not reveal any masses.  Small amount of firm stool noted in the rectum. Musculoskeletal: Symmetrical with no gross deformities  Skin: No lesions on visible extremities Extremities: No edema  Neurological: Alert oriented x 4, grossly non-focal Psychological:  Alert and cooperative. Normal mood and affect  ASSESSMENT AND PLAN: *Family history of colon cancer in her sister who was diagnosed around age 32. *Change in bowel habits with alternating constipation and loose stool.  Frequently has to go back and forth to the bathroom multiple times in the morning to complete her bowel movements.  Suspect incomplete emptying.  Possibly degree of pelvic floor dysfunction/weakness. *Large external hemorrhoids: Seen on exam today.  Saw surgery about 8 years ago and Dr. Debby declined to perform surgery.  Likely cause of her bleeding. *Gas: Likely dietary.  -Will schedule colonoscopy with Dr. Avram.  I think this will give her some reassurance that I think she will tolerate just fine.  The risks, benefits, and alternatives to colonoscopy were discussed with the patient and she consents to proceed.  -We discussed the FODMAP diet.  She is given literature on this. - She thinks that she tried fiber in the past, but is willing to try it again to help bulk her stools and maybe help with more complete emptying.  Suggested trying  Benefiber 2 teaspoons mixed in 8 ounces of liquid daily. - Prescription for hydrocortisone cream sent to her pharmacy.   CC:  Allwardt, Mardy HERO, PA-C

## 2024-04-06 NOTE — Patient Instructions (Signed)
 We have sent the following medications to your pharmacy for you to pick up at your convenience: Hydrocortisone 2.5% cream three times daily.   Low FODmap diet information provided.   Start Benefiber  2 teaspoons in 8 ounces of liquid daily.  You have been scheduled for a colonoscopy. Please follow written instructions given to you at your visit today.   If you use inhalers (even only as needed), please bring them with you on the day of your procedure.  DO NOT TAKE 7 DAYS PRIOR TO TEST- Trulicity (dulaglutide) Ozempic, Wegovy (semaglutide) Mounjaro (tirzepatide) Bydureon Bcise (exanatide extended release)  DO NOT TAKE 1 DAY PRIOR TO YOUR TEST Rybelsus (semaglutide) Adlyxin (lixisenatide) Victoza (liraglutide) Byetta (exanatide) ___________________________________________________________________________

## 2024-04-09 ENCOUNTER — Encounter: Payer: Self-pay | Admitting: Internal Medicine

## 2024-04-09 DIAGNOSIS — H353221 Exudative age-related macular degeneration, left eye, with active choroidal neovascularization: Secondary | ICD-10-CM | POA: Diagnosis not present

## 2024-04-12 DIAGNOSIS — I10 Essential (primary) hypertension: Secondary | ICD-10-CM | POA: Diagnosis not present

## 2024-04-13 LAB — BASIC METABOLIC PANEL WITH GFR
BUN/Creatinine Ratio: 22 (ref 12–28)
BUN: 15 mg/dL (ref 8–27)
CO2: 23 mmol/L (ref 20–29)
Calcium: 9 mg/dL (ref 8.7–10.3)
Chloride: 101 mmol/L (ref 96–106)
Creatinine, Ser: 0.68 mg/dL (ref 0.57–1.00)
Glucose: 94 mg/dL (ref 70–99)
Potassium: 3.7 mmol/L (ref 3.5–5.2)
Sodium: 141 mmol/L (ref 134–144)
eGFR: 86 mL/min/1.73 (ref >59)

## 2024-04-16 ENCOUNTER — Ambulatory Visit: Payer: Self-pay | Admitting: Pharmacist

## 2024-04-17 ENCOUNTER — Ambulatory Visit (AMBULATORY_SURGERY_CENTER): Admitting: Internal Medicine

## 2024-04-17 ENCOUNTER — Encounter: Payer: Self-pay | Admitting: Internal Medicine

## 2024-04-17 VITALS — BP 144/53 | HR 56 | Temp 98.0°F | Resp 9 | Ht 64.0 in | Wt 158.0 lb

## 2024-04-17 DIAGNOSIS — N816 Rectocele: Secondary | ICD-10-CM

## 2024-04-17 DIAGNOSIS — Z800 Family history of malignant neoplasm of digestive organs: Secondary | ICD-10-CM | POA: Diagnosis not present

## 2024-04-17 DIAGNOSIS — K644 Residual hemorrhoidal skin tags: Secondary | ICD-10-CM | POA: Diagnosis not present

## 2024-04-17 DIAGNOSIS — R194 Change in bowel habit: Secondary | ICD-10-CM | POA: Diagnosis not present

## 2024-04-17 DIAGNOSIS — D125 Benign neoplasm of sigmoid colon: Secondary | ICD-10-CM | POA: Diagnosis not present

## 2024-04-17 DIAGNOSIS — K648 Other hemorrhoids: Secondary | ICD-10-CM

## 2024-04-17 DIAGNOSIS — D124 Benign neoplasm of descending colon: Secondary | ICD-10-CM | POA: Diagnosis not present

## 2024-04-17 DIAGNOSIS — D123 Benign neoplasm of transverse colon: Secondary | ICD-10-CM

## 2024-04-17 DIAGNOSIS — K5730 Diverticulosis of large intestine without perforation or abscess without bleeding: Secondary | ICD-10-CM

## 2024-04-17 DIAGNOSIS — K635 Polyp of colon: Secondary | ICD-10-CM

## 2024-04-17 MED ORDER — SODIUM CHLORIDE 0.9 % IV SOLN: 500.0000 mL | INTRAVENOUS | Status: DC

## 2024-04-17 NOTE — Patient Instructions (Addendum)
 You do not have colon cancer.  I found and removed 5 small polyps and saw your hemorrhoids.  I will let you know pathology results of the polyps but they all look benign.  I do not recommend a routine repeat colonoscopy.  I think your bowel habit changes are related to pelvic floor dysfunction.  You also have something called a rectocele.  I think you would benefit from specific pelvic floor physical therapy.  Will discuss referring you for that.  I appreciate the opportunity to care for you. Lupita CHARLENA Commander, MD, [facility]  Resume previous diet.  Continue present medications.  Awaiting pathology results. Handouts provided on polyps, diverticulosis, and hemorrhoids.   YOU HAD AN ENDOSCOPIC PROCEDURE TODAY AT THE [facility]:   Refer to the procedure report that was given to you for any specific questions about what was found during the examination.  If the procedure report does not answer your questions, please call your gastroenterologist to clarify.  If you requested that your care partner not be given the details of your procedure findings, then the procedure report has been included in a sealed envelope for you to review at your convenience later.  YOU SHOULD EXPECT: Some feelings of bloating in the abdomen. Passage of more gas than usual.  Walking can help get rid of the air that was put into your GI tract during the procedure and reduce the bloating. If you had a lower endoscopy (such as a colonoscopy or flexible sigmoidoscopy) you may notice spotting of blood in your stool or on the toilet paper. If you underwent a bowel prep for your procedure, you may not have a normal bowel movement for a few days.  Please Note:  You might notice some irritation and congestion in your nose or some drainage.  This is from the oxygen used during your procedure.  There is no need for concern and it should clear up in a day or so.  SYMPTOMS TO REPORT IMMEDIATELY:  Following lower endoscopy  (colonoscopy or flexible sigmoidoscopy):  Excessive amounts of blood in the stool  Significant tenderness or worsening of abdominal pains  Swelling of the abdomen that is new, acute  Fever of 100F or higher  For urgent or emergent issues, a gastroenterologist can be reached at any hour by calling [phone]. Do not use MyChart messaging for urgent concerns.    DIET:  We do recommend a small meal at first, but then you may proceed to your regular diet.  Drink plenty of fluids but you should avoid alcoholic beverages for 24 hours.  ACTIVITY:  You should plan to take it easy for the rest of today and you should NOT DRIVE or use heavy machinery until tomorrow (because of the sedation medicines used during the test).    FOLLOW UP: Our staff will call the number listed on your records the next business day following your procedure.  We will call around 7:15- 8:00 am to check on you and address any questions or concerns that you may have regarding the information given to you following your procedure. If we do not reach you, we will leave a message.     If any biopsies were taken you will be contacted by phone or by letter within the next 1-3 weeks.  Please call us  at [phone] if you have not heard about the biopsies in 3 weeks.    SIGNATURES/CONFIDENTIALITY: You and/or your care partner have signed paperwork which will be entered into  your electronic medical record.  These signatures attest to the fact that that the information above on your After Visit Summary has been reviewed and is understood.  Full responsibility of the confidentiality of this discharge information lies with you and/or your care-partner.

## 2024-04-17 NOTE — Progress Notes (Signed)
 Pt's states no medical or surgical changes since previsit or office visit.

## 2024-04-17 NOTE — Addendum Note (Signed)
 Addended by: Doni Widmer M on: 04/17/2024 03:42 PM   Modules accepted: Orders

## 2024-04-17 NOTE — Progress Notes (Signed)
 Vss nad trans to pacu

## 2024-04-17 NOTE — Op Note (Addendum)
 [facility] Patient Name: Teresa Rangel Procedure Date: 04/17/2024 1:34 PM MRN: [id number] Endoscopist: Lupita FORBES Commander , MD, [id number] Age: 83 Referring MD:  Date of Birth: 04/04/1941 Gender: Female Account #: [id number] Procedure:                Colonoscopy Indications:              Change in bowel habits - her 68 yo sister had colon                            cancer also Medicines:                Monitored Anesthesia Care Procedure:                Pre-Anesthesia Assessment:                           - Prior to the procedure, a History and Physical                            was performed, and patient medications and                            allergies were reviewed. The patient's tolerance of                            previous anesthesia was also reviewed. The risks                            and benefits of the procedure and the sedation                            options and risks were discussed with the patient.                            All questions were answered, and informed consent                            was obtained. Prior Anticoagulants: The patient has                            taken no anticoagulant or antiplatelet agents. ASA                            Grade Assessment: III - A patient with severe                            systemic disease. After reviewing the risks and                            benefits, the patient was deemed in satisfactory                            condition to undergo the procedure.  After obtaining informed consent, the colonoscope                            was passed under direct vision. Throughout the                            procedure, the patient's blood pressure, pulse, and                            oxygen saturations were monitored continuously. The                            [id number]-HQ190L Colonoscope [id number] was introduced                            through the anus and advanced to the the  cecum,                            identified by appendiceal orifice and ileocecal                            valve. The patient tolerated the procedure well.                            The colonoscopy was somewhat difficult due to                            significant looping. Successful completion of the                            procedure was aided by applying abdominal pressure.                            The bowel preparation used was SUPREP via split                            dose instruction. The ileocecal valve, appendiceal                            orifice, and rectum were photographed. The quality                            of the bowel preparation was good. Scope In: 1:56:14 PM Scope Out: 2:10:35 PM Scope Withdrawal Time: 0 hours 10 minutes 42 seconds  Total Procedure Duration: 0 hours 14 minutes 21 seconds  Findings:                 Hemorrhoids were found on perianal exam.                           The digital rectal exam findings include decreased                            voluntary squeeze, rectocele and exagerated descent.  Five sessile polyps were found in the sigmoid                            colon, descending colon and transverse colon. The                            polyps were 2 to 7 mm in size. These polyps were                            removed with a cold snare. Resection and retrieval                            were complete. Verification of patient                            identification for the specimen was done.                           Internal hemorrhoids were found.                           Multiple diverticula were found in the sigmoid                            colon.                           The exam was otherwise without abnormality on                            direct and retroflexion views. Complications:            No immediate complications. Estimated Blood Loss:     Estimated blood loss was minimal. Impression:                - Hemorrhoids found on perianal exam. (Large                            external)                           - Decreased voluntary squeeze, rectocele and                            exagerated descent found on digital rectal exam.                           - Five 2 to 7 mm polyps in the sigmoid colon, in                            the descending colon and in the transverse colon,                            removed with a cold snare. Resected and retrieved.                           -  Internal hemorrhoids. Also see banding scars.                           - Diverticulosis in the sigmoid colon.                           - The examination was otherwise normal on direct                            and retroflexion views. Recommendation:           - Patient has a contact number available for                            emergencies. The signs and symptoms of potential                            delayed complications were discussed with the                            patient. Return to normal activities tomorrow.                            Written discharge instructions were provided to the                            patient.                           - Resume previous diet.                           - Continue present medications.                           - Await pathology results.                           - No repeat colonoscopy due to age.                           - rectal exam and history suggest pelvic floor                            dysfunction - will discuss referral to pelvic PT                           Addendum: she also has cystocele found an urology -                            so: 1) refer to urogynecology re: cystocele and                            rectocele 2) also refer to pelvic PT re: altered  bowel habits and pelvic floor dysfunction Lupita FORBES Commander, MD 04/17/2024 2:22:53 PM This report has been signed electronically.

## 2024-04-17 NOTE — Progress Notes (Signed)
 History and Physical Interval Note:  04/17/2024 1:48 PM  Teresa Rangel  has presented today for endoscopic procedure(s), with the diagnosis of  Encounter Diagnoses  Name Primary?   Altered bowel habits Yes   Family history of colon cancer   .  The various methods of evaluation and treatment have been discussed with the patient and/or family. After consideration of risks, benefits and other options for treatment, the patient has consented to  the endoscopic procedure(s).   The patient's history has been reviewed, patient examined, no change in status, stable for endoscopic procedure(s).  I have reviewed the patient's chart and labs.  Questions were answered to the patient's satisfaction.     Teresa Rangel CHARLENA Commander, MD, [facility]

## 2024-04-18 ENCOUNTER — Telehealth: Payer: Self-pay | Admitting: *Deleted

## 2024-04-18 ENCOUNTER — Telehealth: Payer: Self-pay

## 2024-04-18 DIAGNOSIS — R194 Change in bowel habit: Secondary | ICD-10-CM

## 2024-04-18 DIAGNOSIS — N8110 Cystocele, unspecified: Secondary | ICD-10-CM

## 2024-04-18 NOTE — Telephone Encounter (Signed)
 No answer during follow up call. Left a message.

## 2024-04-18 NOTE — Telephone Encounter (Signed)
 Per Dr Avram s/p colonoscopy done 04/17/2024 several referrals have been place: urogynecology for cystocele and rectocele. Also pelvic PT referral for altered bowel habits and pelvic floor dysfunction.   I left her a detailed voice mail message to be expecting calls to set up these appointments.

## 2024-04-19 ENCOUNTER — Other Ambulatory Visit: Payer: Self-pay | Admitting: Cardiology

## 2024-04-19 LAB — SURGICAL PATHOLOGY

## 2024-04-22 ENCOUNTER — Ambulatory Visit: Payer: Self-pay | Admitting: Internal Medicine

## 2024-04-25 ENCOUNTER — Other Ambulatory Visit: Payer: Self-pay

## 2024-04-25 ENCOUNTER — Encounter: Payer: Self-pay | Admitting: Physical Therapy

## 2024-04-25 ENCOUNTER — Ambulatory Visit: Attending: Internal Medicine | Admitting: Physical Therapy

## 2024-04-25 DIAGNOSIS — M6281 Muscle weakness (generalized): Secondary | ICD-10-CM | POA: Insufficient documentation

## 2024-04-25 DIAGNOSIS — R279 Unspecified lack of coordination: Secondary | ICD-10-CM | POA: Insufficient documentation

## 2024-04-25 DIAGNOSIS — R194 Change in bowel habit: Secondary | ICD-10-CM | POA: Diagnosis not present

## 2024-04-25 DIAGNOSIS — R293 Abnormal posture: Secondary | ICD-10-CM | POA: Insufficient documentation

## 2024-04-25 NOTE — Therapy (Signed)
 OUTPATIENT PHYSICAL THERAPY FEMALE PELVIC EVALUATION   Patient Name: Teresa Rangel MRN: [id number] DOB:01-31-1941, 83 y.o., female Today's Date: 04/25/2024  END OF SESSION:  PT End of Session - 04/25/24 0836     Visit Number 1    Number of Visits 10    Date for PT Re-Evaluation 07/04/24    Authorization Type HTA    PT Start Time 0800    PT Stop Time 0845    PT Time Calculation (min) 45 min    Activity Tolerance Patient tolerated treatment well    Behavior During Therapy WFL for tasks assessed/performed          Past Medical History:  Diagnosis Date   Allergy    History of stomach ulcers 1980   Hyperlipidemia    Hypertension    Hypothyroidism    Macular degeneration of left eye    OSA (obstructive sleep apnea)    mild obstructive sleep apnea with an AHI of 10/hr.  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/2019   Benign    Past Surgical History:  Procedure Laterality Date   APPENDECTOMY  1954   COLONOSCOPY     LAPAROSCOPY  1982   THYROIDECTOMY  2007   TONSILLECTOMY  1949   Patient Active Problem List   Diagnosis Date Noted   Flatulence 04/06/2024   Altered bowel habits 04/06/2024   Hemorrhoids 04/06/2024   Family history of colon cancer 04/06/2024   Cough variant asthma 01/10/2024   Upper airway cough syndrome 01/10/2024   Allergic rhinitis 01/10/2024   Degenerative disorder of musculoskeletal system 01/03/2024   Low back pain 01/03/2024   Lumbar radiculopathy 01/03/2024   Spondylolisthesis of lumbar region 01/03/2024   Heberden's nodes (with arthropathy) 11/30/2023   Pain in right hand 11/30/2023   Pain of left hand 11/30/2023   Anxiety and depression 04/12/2023   OSA (obstructive sleep apnea) 10/13/2022   Renal artery stenosis (HCC) 09/22/2022   Primary insomnia 02/02/2022   Bronchitis 08/18/2021   Macular  degeneration disease 06/04/2020   Chronic cystitis 09/20/2019   Gout 05/10/2019   Osteoporosis 05/07/2019   Varicose veins of both lower extremities with pain 12/06/2018   Dyslipidemia 06/07/2013   Essential hypertension 08/24/2012   Hypothyroidism 08/24/2012   Vitamin D  deficiency 08/24/2012    PCP: Allwardt, Mardy HERO, PA-C  REFERRING PROVIDER: Avram Lupita BRAVO, MD   REFERRING DIAG: R19.4 (ICD-10-CM) - Altered bowel habits  THERAPY DIAG:  Muscle weakness (generalized)  Unspecified lack of coordination  Abnormal posture  Rationale for Evaluation and Treatment: Rehabilitation  ONSET DATE: after childbirth   SUBJECTIVE:  SUBJECTIVE STATEMENT: Patient reports to PFPT with bad hemorrhoids that she has had since the birth of her children, and they have gotten worse. She has difficulty having bowel movements and she has to return to the bathroom multiple times before she feels empty. She had a colonoscopy 10 days ago and it came back all clear other than some polyps that were benign that were removed. Dr. Avram thinks that she is experiencing prolapse. She is now taking 2 teaspoons of benefiber and it doesn't cause any bloating/gas for her. She is currently following the FODMAP diet for her digestion and this makes a difference with her bowel habits/gas.  Fluid intake: drinks 3-4 bottles of water daily (16.9 fl oz), she adds a liquid IV to her water; coffee in the morning (1-1.5 cups), collagen in coffee, occasionally a dr pepper   PAIN:  Are you having pain? No NPRS scale: 0/10  PRECAUTIONS: None  RED FLAGS: None   WEIGHT BEARING RESTRICTIONS: No  FALLS:  Has patient fallen in last 6 months? Yes. 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   ACTIVITY LEVEL : low level of physical activity - tries to walk daily   PLOF: Independent with basic ADLs  PATIENT GOALS: better health, more control of gas/feces,    BOWEL MOVEMENT: Pain with bowel movement: No Type of bowel movement:Type (Bristol Stool Scale) 4, Frequency daily, Strain no, and Splinting no Fully empty rectum: No - could return 2 or 3 times after initial bowel movement  Leakage: Yes: if she doesn't get there in time  Pads: Yes: wears one just in case she has fecal incontinence  Fiber supplement/laxative Yes - 2 tablespoons of benefiber that is working well for her   URINATION: Pain with urination: No Fully empty bladder: Yes:   Stream: Weak Urgency: Yes  Frequency: within normal limits  Leakage: none Pads: Yes: fear of fecal incontinence  INTERCOURSE: not currently sexually active   Ability to have vaginal penetration Yes  Pain with intercourse: none DrynessYes   PREGNANCY: Vaginal deliveries 2  PROLAPSE: None - but her urologist says she has some bladder prolapse   OBJECTIVE:  Note: Objective measures were completed at Evaluation unless otherwise noted.  PATIENT SURVEYS:  PFIQ-7: 45  COGNITION: Overall cognitive status: Within functional limits for tasks assessed     SENSATION: Light touch: Appears intact  LUMBAR SPECIAL TESTS:  Single leg stance test: Positive  FUNCTIONAL TESTS:  Squat: bilateral dynamic knee valgus with loading, general lumbopelvic stiffness with transfers   GAIT: Assistive device utilized: None Comments: moderate trendelenburg gait pattern with ambulation   POSTURE: rounded shoulders, forward head, and flexed trunk   LUMBARAROM/PROM: within normal limits for all motions bilaterally with no pain   LOWER EXTREMITY ROM: within normal limits for all motions bilaterally with no pain   LOWER EXTREMITY MMT: 4/5  bilateral knees and hips grossly    PALPATION:   General: no tenderness to palpation of bilateral adductors or hip flexors   Pelvic Alignment: within normal limits   Abdominal: upper chest breathing, abdominal bracing at rest, decreased lower rib excursion with inhalation                 External Perineal Exam: patient has significant external hemorrhoids present at external anal sphincter, dryness present  Internal Pelvic Floor: Patient fully consents to today's internal rectal examination. She demonstrates low muscle tone at external and internal anal sphincter. She has a weak pelvic floor contraction and tends to bear down when contracting rather than lifting the muscles. She had no pain or palpable trigger points with today's exam, no stool on glove following exam.   Patient confirms identification and approves PT to assess internal pelvic floor and treatment Yes No emotional/communication barriers or cognitive limitation. Patient is motivated to learn. Patient understands and agrees with treatment goals and plan. PT explains patient will be examined in standing, sitting, and lying down to see how their muscles and joints work. When they are ready, they will be asked to remove their underwear so PT can examine their perineum. The patient is also given the option of providing their own chaperone as one is not provided in our facility. The patient also has the right and is explained the right to defer or refuse any part of the evaluation or treatment including the internal exam. With the patient's consent, PT will use one gloved finger to gently assess the muscles of the pelvic floor, seeing how well it contracts and relaxes and if there is muscle symmetry. After, the patient will get dressed and PT and patient will discuss exam findings and plan of care. PT and patient discuss plan of care, schedule, attendance policy and HEP activities.   PELVIC MMT:   MMT eval  Vaginal   Internal Anal Sphincter  2/5  External Anal Sphincter 2/5  Puborectalis   Diastasis Recti   (Blank rows = not tested)  TONE: Low at internal and external anal sphincter  PROLAPSE: Not assessed in sidelying position today   TODAY'S TREATMENT:                                                                                                                              DATE:   EVAL 04/25/24: Examination completed, findings reviewed, pt educated on POC, HEP, and self care. Pt motivated to participate in PT and agreeable to attempt recommendations.   Neuro re-ed: Hooklying diaphragmatic breathing + pelvic floor lengthening with inhalation + shortening with exhalation 2x10  Hooklying diaphragmatic breathing + quick flick contractions 2x10  Self care:  relative anatomy and the connection between the diaphragm and pelvic floor, how water intake and fiber intake affect stool consistency and emptying  Bowel emptying and control technique education   PATIENT EDUCATION:  Education details: relative anatomy and the connection between the diaphragm and pelvic floor, how water intake and fiber intake affect stool consistency and emptying  Person educated: Patient Education method: Explanation, Demonstration, Tactile cues, Verbal cues, and Handouts Education comprehension: verbalized understanding, returned demonstration, verbal cues required, tactile cues required, and needs further education  HOME EXERCISE PROGRAM: Access Code: [email or web address] URL: [email or web address] Date: 04/25/2024 Prepared by: Celena Domino  Exercises - Supine Pelvic Floor Contraction  - 1 x daily - 7 x weekly -  2 sets - 10 reps - Quick Flick Pelvic Floor Contractions in Hooklying  - 1 x daily - 7 x weekly - 2 sets - 10 reps  Patient Education - Bowel Emptying Techniques - Bowel Emptying Techniques  ASSESSMENT:  CLINICAL IMPRESSION: Patient is a 83 y.o. female  who was seen today for physical therapy evaluation and treatment for bowel  dysfunction including incontinence and difficulty emptying bowels. Patient reports bad hemorrhoids that she has had since the birth of her children, and they have gotten worse. She has difficulty having bowel movements and she has to return to the bathroom multiple times before she feels empty. She had a colonoscopy 10 days ago and it came back all clear other than some polyps that were benign that were removed. Dr. Avram thinks that she is experiencing prolapse. She is now taking 2 teaspoons of benefiber and it doesn't cause any bloating/gas for her. She is currently following the FODMAP diet for her digestion and this makes a difference with her bowel habits/gas. Patient fully consents to today's internal rectal examination. She demonstrates low muscle tone at external and internal anal sphincter. She has a weak pelvic floor contraction and tends to bear down when contracting rather than lifting the muscles. She had no pain or palpable trigger points with today's exam, no stool on glove following exam. Overall, patient tolerated session well and Pt would benefit from additional PT to further address deficits.    OBJECTIVE IMPAIRMENTS: decreased coordination, decreased endurance, decreased mobility, decreased ROM, and decreased strength.   ACTIVITY LIMITATIONS: continence  PARTICIPATION LIMITATIONS: community activity  PERSONAL FACTORS: Age, Past/current experiences, and Time since onset of injury/illness/exacerbation are also affecting patient's functional outcome.   REHAB POTENTIAL: Good  CLINICAL DECISION MAKING: Stable/uncomplicated  EVALUATION COMPLEXITY: Low   GOALS: Goals reviewed with patient? Yes  SHORT TERM GOALS: Target date: 05/23/2024  Pt will be independent with HEP.  Baseline: Goal status: INITIAL  2.  Pt will be independent with use of squatty potty, relaxed toileting mechanics, and improved bowel movement techniques in order to increase ease of bowel movements and  complete evacuation.  Baseline:  Goal status: INITIAL  3.  Pt will be independent with the knack, urge suppression technique, and double voiding in order to improve bladder habits and decrease urinary incontinence.  Baseline:  Goal status: INITIAL  4.  Pt will report her BMs are complete due to improved bowel habits and evacuation techniques.  Baseline:  Goal status: INITIAL  LONG TERM GOALS: Target date: 10/26/2024  Pt will be independent with advanced HEP.  Baseline:  Goal status: INITIAL  2.  Pt to demonstrate improved coordination of pelvic floor and breathing mechanics with 10# squat with appropriate synergistic patterns to decrease pain and leakage at least 75% of the time for improved ability to complete a 30 minute workout with strain at pelvic floor and symptoms.   Baseline:  Goal status: INITIAL  3.  Pt will report 75% less abdominal bloating and discomfort due to improved muscle tone throughout the core and pelvic floor to improve quality of life and flatulence.  Baseline:  Goal status: INITIAL  4.  Pt will demonstrate 3/5 pelvic floor muscle strength with appropriate coordination in order to decrease urinary incontinence, urgency and frequency.   Baseline:  Goal status: INITIAL  PLAN:  PT FREQUENCY: 1-2x/week  PT DURATION: 12 weeks  PLANNED INTERVENTIONS: 97110-Therapeutic exercises, 97530- Therapeutic activity, [id number]- Neuromuscular re-education, 97535- Self Care, [id number]- Manual therapy, Patient/Family education, Taping, Joint  mobilization, Spinal mobilization, Scar mobilization, Cryotherapy, and Moist heat  PLAN FOR NEXT SESSION: continued pelvic floor AROM training in seated, introduce gluteal strengthening, discuss toileting and emptying techniques, knack technique   Celena JAYSON Domino, PT 04/25/2024, 8:48 AM

## 2024-05-04 ENCOUNTER — Encounter: Admitting: Internal Medicine

## 2024-05-04 ENCOUNTER — Other Ambulatory Visit: Payer: Self-pay | Admitting: Physician Assistant

## 2024-05-04 ENCOUNTER — Ambulatory Visit
Admission: RE | Admit: 2024-05-04 | Discharge: 2024-05-04 | Disposition: A | Discharge status: Home / Self Care | Source: Ambulatory Visit | Attending: Physician Assistant | Admitting: Physician Assistant

## 2024-05-04 DIAGNOSIS — Z1231 Encounter for screening mammogram for malignant neoplasm of breast: Secondary | ICD-10-CM

## 2024-05-04 DIAGNOSIS — F419 Anxiety disorder, unspecified: Secondary | ICD-10-CM

## 2024-05-08 ENCOUNTER — Encounter: Payer: Self-pay | Admitting: Pharmacist

## 2024-05-21 DIAGNOSIS — H35033 Hypertensive retinopathy, bilateral: Secondary | ICD-10-CM | POA: Diagnosis not present

## 2024-05-21 DIAGNOSIS — H353221 Exudative age-related macular degeneration, left eye, with active choroidal neovascularization: Secondary | ICD-10-CM | POA: Diagnosis not present

## 2024-05-21 DIAGNOSIS — H353112 Nonexudative age-related macular degeneration, right eye, intermediate dry stage: Secondary | ICD-10-CM | POA: Diagnosis not present

## 2024-05-21 DIAGNOSIS — H353123 Nonexudative age-related macular degeneration, left eye, advanced atrophic without subfoveal involvement: Secondary | ICD-10-CM | POA: Diagnosis not present

## 2024-05-21 DIAGNOSIS — H43813 Vitreous degeneration, bilateral: Secondary | ICD-10-CM | POA: Diagnosis not present

## 2024-05-21 DIAGNOSIS — H35372 Puckering of macula, left eye: Secondary | ICD-10-CM | POA: Diagnosis not present

## 2024-05-24 ENCOUNTER — Ambulatory Visit: Payer: Self-pay | Attending: Internal Medicine | Admitting: Physical Therapy

## 2024-05-24 DIAGNOSIS — R293 Abnormal posture: Secondary | ICD-10-CM | POA: Insufficient documentation

## 2024-05-24 DIAGNOSIS — M6281 Muscle weakness (generalized): Secondary | ICD-10-CM | POA: Diagnosis not present

## 2024-05-24 DIAGNOSIS — R279 Unspecified lack of coordination: Secondary | ICD-10-CM | POA: Insufficient documentation

## 2024-05-24 NOTE — Therapy (Signed)
 OUTPATIENT PHYSICAL THERAPY FEMALE PELVIC TREATMENT   Patient Name: Teresa Rangel MRN: [id number] DOB:07/29/1941, 83 y.o., female Today's Date: 05/24/2024  END OF SESSION:  PT End of Session - 05/24/24 1614     Visit Number 2    Number of Visits 10    Date for PT Re-Evaluation 07/04/24    Authorization Type HTA    PT Start Time 0330    PT Stop Time 0415    PT Time Calculation (min) 45 min    Activity Tolerance Patient tolerated treatment well    Behavior During Therapy WFL for tasks assessed/performed           Past Medical History:  Diagnosis Date   Allergy    History of stomach ulcers 1980   Hyperlipidemia    Hypertension    Hypothyroidism    Macular degeneration of left eye    OSA (obstructive sleep apnea)    mild obstructive sleep apnea with an AHI of 10/hr.  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/2019   Benign    Past Surgical History:  Procedure Laterality Date   APPENDECTOMY  1954   COLONOSCOPY     LAPAROSCOPY  1982   THYROIDECTOMY  2007   TONSILLECTOMY  1949   Patient Active Problem List   Diagnosis Date Noted   Flatulence 04/06/2024   Altered bowel habits 04/06/2024   Hemorrhoids 04/06/2024   Family history of colon cancer 04/06/2024   Cough variant asthma 01/10/2024   Upper airway cough syndrome 01/10/2024   Allergic rhinitis 01/10/2024   Degenerative disorder of musculoskeletal system 01/03/2024   Low back pain 01/03/2024   Lumbar radiculopathy 01/03/2024   Spondylolisthesis of lumbar region 01/03/2024   Heberden's nodes (with arthropathy) 11/30/2023   Pain in right hand 11/30/2023   Pain of left hand 11/30/2023   Anxiety and depression 04/12/2023   OSA (obstructive sleep apnea) 10/13/2022   Renal artery stenosis (HCC) 09/22/2022   Primary insomnia 02/02/2022   Bronchitis 08/18/2021   Macular  degeneration disease 06/04/2020   Chronic cystitis 09/20/2019   Gout 05/10/2019   Osteoporosis 05/07/2019   Varicose veins of both lower extremities with pain 12/06/2018   Dyslipidemia 06/07/2013   Essential hypertension 08/24/2012   Hypothyroidism 08/24/2012   Vitamin D  deficiency 08/24/2012    PCP: Allwardt, Mardy HERO, PA-C  REFERRING PROVIDER: Avram Lupita BRAVO, MD   REFERRING DIAG: R19.4 (ICD-10-CM) - Altered bowel habits  THERAPY DIAG:  Muscle weakness (generalized)  Unspecified lack of coordination  Abnormal posture  Rationale for Evaluation and Treatment: Rehabilitation  ONSET DATE: after childbirth   SUBJECTIVE:  SUBJECTIVE STATEMENT: She got a report back from Dr. Avram from her colonoscopy - there is a polyp near her anus that is present. She has been feeling stronger in the lower abdominal/pelvic floor region. She has been consistent with HEP.  Eval: Patient reports to PFPT with bad hemorrhoids that she has had since the birth of her children, and they have gotten worse. She has difficulty having bowel movements and she has to return to the bathroom multiple times before she feels empty. She had a colonoscopy 10 days ago and it came back all clear other than some polyps that were benign that were removed. Dr. Avram thinks that she is experiencing prolapse. She is now taking 2 teaspoons of benefiber and it doesn't cause any bloating/gas for her. She is currently following the FODMAP diet for her digestion and this makes a difference with her bowel habits/gas.  Fluid intake: drinks 3-4 bottles of water daily (16.9 fl oz), she adds a liquid IV to her water; coffee in the morning (1-1.5 cups), collagen in coffee, occasionally a dr pepper   PAIN:  Are you having pain? No NPRS scale:  0/10  PRECAUTIONS: None  RED FLAGS: None   WEIGHT BEARING RESTRICTIONS: No  FALLS:  Has patient fallen in last 6 months? Yes. 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   ACTIVITY LEVEL : low level of physical activity - tries to walk daily   PLOF: Independent with basic ADLs  PATIENT GOALS: better health, more control of gas/feces,    BOWEL MOVEMENT: Pain with bowel movement: No Type of bowel movement:Type (Bristol Stool Scale) 4, Frequency daily, Strain no, and Splinting no Fully empty rectum: No - could return 2 or 3 times after initial bowel movement  Leakage: Yes: if she doesn't get there in time  Pads: Yes: wears one just in case she has fecal incontinence  Fiber supplement/laxative Yes - 2 tablespoons of benefiber that is working well for her   URINATION: Pain with urination: No Fully empty bladder: Yes:   Stream: Weak Urgency: Yes  Frequency: within normal limits  Leakage: none Pads: Yes: fear of fecal incontinence  INTERCOURSE: not currently sexually active   Ability to have vaginal penetration Yes  Pain with intercourse: none DrynessYes   PREGNANCY: Vaginal deliveries 2  PROLAPSE: None - but her urologist says she has some bladder prolapse   OBJECTIVE:  Note: Objective measures were completed at Evaluation unless otherwise noted.  PATIENT SURVEYS:  PFIQ-7: 45  COGNITION: Overall cognitive status: Within functional limits for tasks assessed     SENSATION: Light touch: Appears intact  LUMBAR SPECIAL TESTS:  Single leg stance test: Positive  FUNCTIONAL TESTS:  Squat: bilateral dynamic knee valgus with loading, general lumbopelvic stiffness with transfers   GAIT: Assistive device utilized: None Comments: moderate trendelenburg gait pattern with ambulation   POSTURE: rounded shoulders, forward head, and flexed trunk    LUMBARAROM/PROM: within normal limits for all motions bilaterally with no pain   LOWER EXTREMITY ROM: within normal limits for all motions bilaterally with no pain   LOWER EXTREMITY MMT: 4/5  bilateral knees and hips grossly   PALPATION:   General: no tenderness to palpation of bilateral adductors or hip flexors   Pelvic Alignment: within normal limits   Abdominal: upper chest breathing, abdominal bracing at rest, decreased lower rib excursion with inhalation  External Perineal Exam: patient has significant external hemorrhoids present at external anal sphincter, dryness present                              Internal Pelvic Floor: Patient fully consents to today's internal rectal examination. She demonstrates low muscle tone at external and internal anal sphincter. She has a weak pelvic floor contraction and tends to bear down when contracting rather than lifting the muscles. She had no pain or palpable trigger points with today's exam, no stool on glove following exam.   Patient confirms identification and approves PT to assess internal pelvic floor and treatment Yes No emotional/communication barriers or cognitive limitation. Patient is motivated to learn. Patient understands and agrees with treatment goals and plan. PT explains patient will be examined in standing, sitting, and lying down to see how their muscles and joints work. When they are ready, they will be asked to remove their underwear so PT can examine their perineum. The patient is also given the option of providing their own chaperone as one is not provided in our facility. The patient also has the right and is explained the right to defer or refuse any part of the evaluation or treatment including the internal exam. With the patient's consent, PT will use one gloved finger to gently assess the muscles of the pelvic floor, seeing how well it contracts and relaxes and if there is muscle symmetry. After, the patient  will get dressed and PT and patient will discuss exam findings and plan of care. PT and patient discuss plan of care, schedule, attendance policy and HEP activities.   PELVIC MMT:   MMT eval  Vaginal 3/5 with weakness and lack of coordination   Internal Anal Sphincter 2/5  External Anal Sphincter 2/5  Puborectalis   Diastasis Recti   (Blank rows = not tested)  TONE: Low at internal and external anal sphincter  PROLAPSE: Not assessed in sidelying position today   TODAY'S TREATMENT:                                                                                                                              DATE:   EVAL 04/25/24: Examination completed, findings reviewed, pt educated on POC, HEP, and self care. Pt motivated to participate in PT and agreeable to attempt recommendations.   Neuro re-ed: Hooklying diaphragmatic breathing + pelvic floor lengthening with inhalation + shortening with exhalation 2x10  Hooklying diaphragmatic breathing + quick flick contractions 2x10  Self care:  relative anatomy and the connection between the diaphragm and pelvic floor, how water intake and fiber intake affect stool consistency and emptying  Bowel emptying and control technique education   05/24/24: Internal vaginal examination and prolapse assessment: patient demonstrates grade 2 uterine prolapse in hooklying prolapse testing position  - Seated Pelvic Floor Contraction  - 1 x daily - 7 x weekly - 2 sets -  10 reps - Seated Quick Flick Pelvic Floor Contractions  - 1 x daily - 7 x weekly - 2 sets - 10 reps - Pelvic Floor Muscle Contraction and Pelvic Tilt With Hips Elevated (for Pelvic Organ Prolapse)  - 1 x daily - 7 x weekly - 2 sets - 10 reps  PATIENT EDUCATION:  Education details: relative anatomy and the connection between the diaphragm and pelvic floor, how water intake and fiber intake affect stool consistency and emptying  Person educated: Patient Education method: Explanation,  Demonstration, Tactile cues, Verbal cues, and Handouts Education comprehension: verbalized understanding, returned demonstration, verbal cues required, tactile cues required, and needs further education  HOME EXERCISE PROGRAM: Access Code: [email or web address] URL: [email or web address] Date: 05/24/2024 Prepared by: Celena Domino  Exercises - Seated Pelvic Floor Contraction  - 1 x daily - 7 x weekly - 2 sets - 10 reps - Seated Quick Flick Pelvic Floor Contractions  - 1 x daily - 7 x weekly - 2 sets - 10 reps - Pelvic Floor Muscle Contraction and Pelvic Tilt With Hips Elevated (for Pelvic Organ Prolapse)  - 1 x daily - 7 x weekly - 2 sets - 10 reps  Patient Education - Bowel Emptying Techniques - Bowel Emptying Techniques  ASSESSMENT:  CLINICAL IMPRESSION: Patient is a 83 y.o. female  who was seen today for physical therapy treatment for bowel dysfunction including incontinence and difficulty emptying bowels. Patient reports improved pelvic floor control since eval. She tolerated pelvic floor training progressions today very well with no increase in pain or heaviness in the pelvic floor. Vaginal examination revealed grade 1/2 uterine prolapse that is present with cough test. Prolapse management exercises introduced to start addressing this. Overall, patient tolerated session well and Pt would benefit from additional PT to further address deficits.    OBJECTIVE IMPAIRMENTS: decreased coordination, decreased endurance, decreased mobility, decreased ROM, and decreased strength.   ACTIVITY LIMITATIONS: continence  PARTICIPATION LIMITATIONS: community activity  PERSONAL FACTORS: Age, Past/current experiences, and Time since onset of injury/illness/exacerbation are also affecting patient's functional outcome.   REHAB POTENTIAL: Good  CLINICAL DECISION MAKING: Stable/uncomplicated  EVALUATION COMPLEXITY: Low   GOALS: Goals reviewed with patient? Yes  SHORT TERM GOALS: Target date:  05/23/2024  Pt will be independent with HEP.  Baseline: Goal status: INITIAL  2.  Pt will be independent with use of squatty potty, relaxed toileting mechanics, and improved bowel movement techniques in order to increase ease of bowel movements and complete evacuation.  Baseline:  Goal status: INITIAL  3.  Pt will be independent with the knack, urge suppression technique, and double voiding in order to improve bladder habits and decrease urinary incontinence.  Baseline:  Goal status: INITIAL  4.  Pt will report her BMs are complete due to improved bowel habits and evacuation techniques.  Baseline:  Goal status: INITIAL  LONG TERM GOALS: Target date: 10/26/2024  Pt will be independent with advanced HEP.  Baseline:  Goal status: INITIAL  2.  Pt to demonstrate improved coordination of pelvic floor and breathing mechanics with 10# squat with appropriate synergistic patterns to decrease pain and leakage at least 75% of the time for improved ability to complete a 30 minute workout with strain at pelvic floor and symptoms.   Baseline:  Goal status: INITIAL  3.  Pt will report 75% less abdominal bloating and discomfort due to improved muscle tone throughout the core and pelvic floor to improve quality of life and flatulence.  Baseline:  Goal status: INITIAL  4.  Pt will demonstrate 3/5 pelvic floor muscle strength with appropriate coordination in order to decrease urinary incontinence, urgency and frequency.   Baseline:  Goal status: INITIAL  PLAN:  PT FREQUENCY: 1-2x/week  PT DURATION: 12 weeks  PLANNED INTERVENTIONS: 97110-Therapeutic exercises, 97530- Therapeutic activity, 97112- Neuromuscular re-education, 97535- Self Care, [id number]- Manual therapy, Patient/Family education, Taping, Joint mobilization, Spinal mobilization, Scar mobilization, Cryotherapy, and Moist heat  PLAN FOR NEXT SESSION: continued pelvic floor AROM training in seated, introduce gluteal strengthening, discuss  toileting and emptying techniques, knack technique   Celena JAYSON Domino, PT 05/24/2024, 4:14 PM

## 2024-06-01 ENCOUNTER — Encounter: Payer: Self-pay | Admitting: Physician Assistant

## 2024-06-01 ENCOUNTER — Other Ambulatory Visit: Payer: Self-pay | Admitting: Physician Assistant

## 2024-06-07 ENCOUNTER — Other Ambulatory Visit: Payer: Self-pay | Admitting: Pharmacist Clinician (PhC)/ Clinical Pharmacy Specialist

## 2024-06-07 ENCOUNTER — Other Ambulatory Visit: Payer: Self-pay | Admitting: Cardiology

## 2024-06-07 DIAGNOSIS — E876 Hypokalemia: Secondary | ICD-10-CM

## 2024-06-12 MED ORDER — HYDROCORTISONE (PERIANAL) 2.5 % EX CREA: 1.0000 | TOPICAL_CREAM | Freq: Three times a day (TID) | CUTANEOUS | 3 refills | Status: AC

## 2024-06-12 NOTE — Addendum Note (Signed)
 Addended by: ANITRA ODETTA CROME on: 06/12/2024 08:41 AM   Modules accepted: Orders

## 2024-06-18 ENCOUNTER — Other Ambulatory Visit: Payer: Self-pay | Admitting: Cardiology

## 2024-06-20 ENCOUNTER — Ambulatory Visit: Admitting: Physical Therapy

## 2024-06-27 ENCOUNTER — Ambulatory Visit: Attending: Internal Medicine | Admitting: Physical Therapy

## 2024-06-27 DIAGNOSIS — M6281 Muscle weakness (generalized): Secondary | ICD-10-CM | POA: Diagnosis not present

## 2024-06-27 DIAGNOSIS — R293 Abnormal posture: Secondary | ICD-10-CM | POA: Diagnosis not present

## 2024-06-27 DIAGNOSIS — R279 Unspecified lack of coordination: Secondary | ICD-10-CM | POA: Insufficient documentation

## 2024-06-27 NOTE — Patient Instructions (Signed)

## 2024-06-27 NOTE — Therapy (Signed)
 OUTPATIENT PHYSICAL THERAPY FEMALE PELVIC TREATMENT   Patient Name: Teresa Rangel MRN: [id number] DOB:October 09, 1940, 83 y.o., female Today's Date: 06/27/2024  END OF SESSION:  PT End of Session - 06/27/24 0921     Visit Number 3    Number of Visits 10    Date for Recertification  07/04/24    Authorization Type HTA    PT Start Time 0845    PT Stop Time 0925    PT Time Calculation (min) 40 min    Activity Tolerance Patient tolerated treatment well    Behavior During Therapy [facility] for tasks assessed/performed            Past Medical History:  Diagnosis Date   Allergy    History of stomach ulcers 1980   Hyperlipidemia    Hypertension    Hypothyroidism    Macular degeneration of left eye    OSA (obstructive sleep apnea)    mild obstructive sleep apnea with an AHI of 10/hr.  Auto CPAP from 4 to 15 cm H2O   Osteoporosis    Renal artery stenosis    right >60% and left on upper end of 1-59% by dopplers 1/24   Sleep apnea    Tumor of thyroid  08/23/2019   Benign    Past Surgical History:  Procedure Laterality Date   APPENDECTOMY  1954   COLONOSCOPY     LAPAROSCOPY  1982   THYROIDECTOMY  2007   TONSILLECTOMY  1949   Patient Active Problem List   Diagnosis Date Noted   Flatulence 04/06/2024   Altered bowel habits 04/06/2024   Hemorrhoids 04/06/2024   Family history of colon cancer 04/06/2024   Cough variant asthma 01/10/2024   Upper airway cough syndrome 01/10/2024   Allergic rhinitis 01/10/2024   Degenerative disorder of musculoskeletal system 01/03/2024   Low back pain 01/03/2024   Lumbar radiculopathy 01/03/2024   Spondylolisthesis of lumbar region 01/03/2024   Heberden's nodes (with arthropathy) 11/30/2023   Pain in right hand 11/30/2023   Pain of left hand 11/30/2023   Anxiety and depression 04/12/2023   OSA (obstructive sleep apnea) 10/13/2022   Renal artery stenosis 09/22/2022   Primary insomnia 02/02/2022   Bronchitis 08/18/2021   Macular  degeneration disease 06/04/2020   Chronic cystitis 09/20/2019   Gout 05/10/2019   Osteoporosis 05/07/2019   Varicose veins of both lower extremities with pain 12/06/2018   Dyslipidemia 06/07/2013   Essential hypertension 08/24/2012   Hypothyroidism 08/24/2012   Vitamin D  deficiency 08/24/2012    PCP: Allwardt, Mardy HERO, PA-C  REFERRING PROVIDER: Avram Lupita BRAVO, MD   REFERRING DIAG: R19.4 (ICD-10-CM) - Altered bowel habits  THERAPY DIAG:  Muscle weakness (generalized)  Unspecified lack of coordination  Rationale for Evaluation and Treatment: Rehabilitation  ONSET DATE: after childbirth   SUBJECTIVE:  SUBJECTIVE STATEMENT: Patient reports that she is doing well today. She has been having regular bowel movements but she has pain from her hemorrhoids when she passes Bms. She does not have blood in the stool, but she will have blood on the toilet paper. Her pain with bowel movements is 5/10, some mornings it is an 8/10. Almost every morning, she has a hard time getting to the bathroom in time for her bowel movement - she will experience fecal incontinence (small amount).   Eval: Patient reports to PFPT with bad hemorrhoids that she has had since the birth of her children, and they have gotten worse. She has difficulty having bowel movements and she has to return to the bathroom multiple times before she feels empty. She had a colonoscopy 10 days ago and it came back all clear other than some polyps that were benign that were removed. Dr. Avram thinks that she is experiencing prolapse. She is now taking 2 teaspoons of benefiber and it doesn't cause any bloating/gas for her. She is currently following the FODMAP diet for her digestion and this makes a difference with her bowel habits/gas.  Fluid intake:  drinks 3-4 bottles of water daily (16.9 fl oz), she adds a liquid IV to her water; coffee in the morning (1-1.5 cups), collagen in coffee, occasionally a dr pepper   PAIN:  Are you having pain? No NPRS scale: 0/10  PRECAUTIONS: None  RED FLAGS: None   WEIGHT BEARING RESTRICTIONS: No  FALLS:  Has patient fallen in last 6 months? Yes. 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   ACTIVITY LEVEL : low level of physical activity - tries to walk daily   PLOF: Independent with basic ADLs  PATIENT GOALS: better health, more control of gas/feces,    BOWEL MOVEMENT: Pain with bowel movement: No Type of bowel movement:Type (Bristol Stool Scale) 4, Frequency daily, Strain no, and Splinting no Fully empty rectum: No - could return 2 or 3 times after initial bowel movement  Leakage: Yes: if she doesn't get there in time  Pads: Yes: wears one just in case she has fecal incontinence  Fiber supplement/laxative Yes - 2 tablespoons of benefiber that is working well for her   URINATION: Pain with urination: No Fully empty bladder: Yes:   Stream: Weak Urgency: Yes  Frequency: within normal limits  Leakage: none Pads: Yes: fear of fecal incontinence  INTERCOURSE: not currently sexually active   Ability to have vaginal penetration Yes  Pain with intercourse: none DrynessYes   PREGNANCY: Vaginal deliveries 2  PROLAPSE: None - but her urologist says she has some bladder prolapse   OBJECTIVE:  Note: Objective measures were completed at Evaluation unless otherwise noted.  PATIENT SURVEYS:  PFIQ-7: 45  COGNITION: Overall cognitive status: Within functional limits for tasks assessed     SENSATION: Light touch: Appears intact  LUMBAR SPECIAL TESTS:  Single leg stance test: Positive  FUNCTIONAL TESTS:  Squat: bilateral dynamic knee valgus with loading, general  lumbopelvic stiffness with transfers   GAIT: Assistive device utilized: None Comments: moderate trendelenburg gait pattern with ambulation   POSTURE: rounded shoulders, forward head, and flexed trunk   LUMBARAROM/PROM: within normal limits for all motions bilaterally with no pain   LOWER EXTREMITY ROM: within normal limits for all motions bilaterally with no pain   LOWER EXTREMITY MMT: 4/5  bilateral knees and hips  grossly   PALPATION:   General: no tenderness to palpation of bilateral adductors or hip flexors   Pelvic Alignment: within normal limits   Abdominal: upper chest breathing, abdominal bracing at rest, decreased lower rib excursion with inhalation                 External Perineal Exam: patient has significant external hemorrhoids present at external anal sphincter, dryness present                              Internal Pelvic Floor: Patient fully consents to today's internal rectal examination. She demonstrates low muscle tone at external and internal anal sphincter. She has a weak pelvic floor contraction and tends to bear down when contracting rather than lifting the muscles. She had no pain or palpable trigger points with today's exam, no stool on glove following exam.   Patient confirms identification and approves PT to assess internal pelvic floor and treatment Yes No emotional/communication barriers or cognitive limitation. Patient is motivated to learn. Patient understands and agrees with treatment goals and plan. PT explains patient will be examined in standing, sitting, and lying down to see how their muscles and joints work. When they are ready, they will be asked to remove their underwear so PT can examine their perineum. The patient is also given the option of providing their own chaperone as one is not provided in our facility. The patient also has the right and is explained the right to defer or refuse any part of the evaluation or treatment including the internal  exam. With the patient's consent, PT will use one gloved finger to gently assess the muscles of the pelvic floor, seeing how well it contracts and relaxes and if there is muscle symmetry. After, the patient will get dressed and PT and patient will discuss exam findings and plan of care. PT and patient discuss plan of care, schedule, attendance policy and HEP activities.   PELVIC MMT:   MMT eval  Vaginal 3/5 with weakness and lack of coordination   Internal Anal Sphincter 2/5  External Anal Sphincter 2/5  Puborectalis   Diastasis Recti   (Blank rows = not tested)  TONE: Low at internal and external anal sphincter  PROLAPSE: Not assessed in sidelying position today   TODAY'S TREATMENT:                                                                                                                              DATE:   EVAL 04/25/24: Examination completed, findings reviewed, pt educated on POC, HEP, and self care. Pt motivated to participate in PT and agreeable to attempt recommendations.   Neuro re-ed: Hooklying diaphragmatic breathing + pelvic floor lengthening with inhalation + shortening with exhalation 2x10  Hooklying diaphragmatic breathing + quick flick contractions 2x10  Self care:  relative anatomy and the connection between the diaphragm and pelvic floor, how  water intake and fiber intake affect stool consistency and emptying  Bowel emptying and control technique education   05/24/24: Internal vaginal examination and prolapse assessment: patient demonstrates grade 2 uterine prolapse in hooklying prolapse testing position  - Seated Pelvic Floor Contraction  - 1 x daily - 7 x weekly - 2 sets - 10 reps - Seated Quick Flick Pelvic Floor Contractions  - 1 x daily - 7 x weekly - 2 sets - 10 reps - Pelvic Floor Muscle Contraction and Pelvic Tilt With Hips Elevated (for Pelvic Organ Prolapse)  - 1 x daily - 7 x weekly - 2 sets - 10 reps  06/27/24: Standing pelvic floor contraction +  diaphragmatic breathing 2x10  Seated pelvic floor contraction + diaphragmatic breathing 2x10  Seated quick flick contractions + diaphragmatic breathing 2x10  Sit to stand + adductor ball squeeze + diaphragmatic breathing (kegel at top of motion) 2x10  Bridge + adductor ball squeeze + diaphragmatic breathing (kegel at top of motion) 2x10  Sidelying clamshell + reverse clamshell + diaphragmatic breathing 2x12 each  Urge drill for managing fecal incontinence in the mornings when trying to get to the bathroom on time   PATIENT EDUCATION:  Education details: relative anatomy and the connection between the diaphragm and pelvic floor, how water intake and fiber intake affect stool consistency and emptying  Person educated: Patient Education method: Explanation, Demonstration, Tactile cues, Verbal cues, and Handouts Education comprehension: verbalized understanding, returned demonstration, verbal cues required, tactile cues required, and needs further education  HOME EXERCISE PROGRAM: Access Code: [email or web address] URL: [email or web address] Date: 06/27/2024 Prepared by: Celena Domino  Exercises - Standing Pelvic Floor Contraction  - 1 x daily - 7 x weekly - 1 sets - 10 reps - Seated Pelvic Floor Contraction  - 1 x daily - 7 x weekly - 1 sets - 10 reps - Seated Quick Flick Pelvic Floor Contractions  - 1 x daily - 7 x weekly - 2 sets - 10 reps - Pelvic Floor Muscle Contraction and Pelvic Tilt With Hips Elevated (for Pelvic Organ Prolapse)  - 1 x daily - 7 x weekly - 2 sets - 10 reps - Sit to Stand with Mercer Between Knees  - 1 x daily - 7 x weekly - 2 sets - 10 reps - Supine Bridge with Mini Swiss Ball Between Knees  - 1 x daily - 7 x weekly - 2 sets - 10 reps - Clamshell  - 1 x daily - 7 x weekly - 2 sets - 10 reps - Sidelying Reverse Clamshell  - 1 x daily - 7 x weekly - 2 sets - 10 reps  Patient Education - Bowel Emptying Techniques - Bowel Emptying Techniques  ASSESSMENT:  CLINICAL  IMPRESSION: Patient is a 83 y.o. female  who was seen today for physical therapy treatment for bowel dysfunction including incontinence and difficulty emptying bowels. Patient is experiencing fecal incontinence when trying to make it to the bathroom in time in the mornings, and she has pain with bowel movements. She tolerated all pelvic floor and gluteal strengthening exercises well today, with no increase in pain or pelvic organ prolapse pressure. She could feel her pelvic floor contract more effectively when she reclines in a chair. Urge drill provided to help pt get to restroom in time in the mornings. Overall, patient tolerated session well and Pt would benefit from additional PT to further address deficits.    OBJECTIVE IMPAIRMENTS: decreased coordination, decreased endurance, decreased mobility, decreased ROM, and decreased strength.  ACTIVITY LIMITATIONS: continence  PARTICIPATION LIMITATIONS: community activity  PERSONAL FACTORS: Age, Past/current experiences, and Time since onset of injury/illness/exacerbation are also affecting patient's functional outcome.   REHAB POTENTIAL: Good  CLINICAL DECISION MAKING: Stable/uncomplicated  EVALUATION COMPLEXITY: Low   GOALS: Goals reviewed with patient? Yes  SHORT TERM GOALS: Target date: 05/23/2024  Pt will be independent with HEP.  Baseline: Goal status: INITIAL  2.  Pt will be independent with use of squatty potty, relaxed toileting mechanics, and improved bowel movement techniques in order to increase ease of bowel movements and complete evacuation.  Baseline:  Goal status: INITIAL  3.  Pt will be independent with the knack, urge suppression technique, and double voiding in order to improve bladder habits and decrease urinary incontinence.  Baseline:  Goal status: INITIAL  4.  Pt will report her BMs are complete due to improved bowel habits and evacuation techniques.  Baseline:  Goal status: INITIAL  LONG TERM GOALS: Target  date: 10/26/2024  Pt will be independent with advanced HEP.  Baseline:  Goal status: INITIAL  2.  Pt to demonstrate improved coordination of pelvic floor and breathing mechanics with 10# squat with appropriate synergistic patterns to decrease pain and leakage at least 75% of the time for improved ability to complete a 30 minute workout with strain at pelvic floor and symptoms.   Baseline:  Goal status: INITIAL  3.  Pt will report 75% less abdominal bloating and discomfort due to improved muscle tone throughout the core and pelvic floor to improve quality of life and flatulence.  Baseline:  Goal status: INITIAL  4.  Pt will demonstrate 3/5 pelvic floor muscle strength with appropriate coordination in order to decrease urinary incontinence, urgency and frequency.   Baseline:  Goal status: INITIAL  PLAN:  PT FREQUENCY: 1-2x/week  PT DURATION: 12 weeks  PLANNED INTERVENTIONS: 97110-Therapeutic exercises, 97530- Therapeutic activity, 97112- Neuromuscular re-education, 97535- Self Care, [id number]- Manual therapy, Patient/Family education, Taping, Joint mobilization, Spinal mobilization, Scar mobilization, Cryotherapy, and Moist heat  PLAN FOR NEXT SESSION: continued pelvic floor AROM training in seated, introduce gluteal strengthening, discuss toileting and emptying techniques, knack technique   Celena JAYSON Domino, PT 06/27/2024, 9:21 AM

## 2024-06-28 ENCOUNTER — Ambulatory Visit: Payer: Medicare HMO

## 2024-06-28 VITALS — Ht 64.0 in | Wt 158.0 lb

## 2024-06-28 DIAGNOSIS — Z0000 Encounter for general adult medical examination without abnormal findings: Secondary | ICD-10-CM | POA: Diagnosis not present

## 2024-06-28 NOTE — Patient Instructions (Signed)
 Teresa Rangel,  Thank you for taking the time for your Medicare Wellness Visit. I appreciate your continued commitment to your health goals. Please review the care plan we discussed, and feel free to reach out if I can assist you further.  Medicare recommends these wellness visits once per year to help you and your care team stay ahead of potential health issues. These visits are designed to focus on prevention, allowing your provider to concentrate on managing your acute and chronic conditions during your regular appointments.  Please note that Annual Wellness Visits do not include a physical exam. Some assessments may be limited, especially if the visit was conducted virtually. If needed, we may recommend a separate in-person follow-up with your provider.  Ongoing Care Seeing your primary care provider every 3 to 6 months helps us  monitor your health and provide consistent, personalized care.   Referrals If a referral was made during today's visit and you haven't received any updates within two weeks, please contact the referred provider directly to check on the status.  Recommended Screenings:  Health Maintenance  Topic Date Due   COVID-19 Vaccine (3 - Pfizer risk series) 12/14/2019   Flu Shot  04/13/2024   Breast Cancer Screening  05/04/2025   Medicare Annual Wellness Visit  06/28/2025   DTaP/Tdap/Td vaccine (2 - Td or Tdap) 05/22/2033   Pneumococcal Vaccine for age over 60  Completed   DEXA scan (bone density measurement)  Completed   Zoster (Shingles) Vaccine  Completed   Meningitis B Vaccine  Aged Out       06/28/2024    1:50 PM  Advanced Directives  Does Patient Have a Medical Advance Directive? Yes  Type of Estate agent of [place];Living will  Copy of Healthcare Power of Attorney in Chart? No - copy requested   Advance Care Planning is important because it: Ensures you receive medical care that aligns with your values, goals, and  preferences. Provides guidance to your family and loved ones, reducing the emotional burden of decision-making during critical moments.  Vision: Annual vision screenings are recommended for early detection of glaucoma, cataracts, and diabetic retinopathy. These exams can also reveal signs of chronic conditions such as diabetes and high blood pressure.  Dental: Annual dental screenings help detect early signs of oral cancer, gum disease, and other conditions linked to overall health, including heart disease and diabetes.  Please see the attached documents for additional preventive care recommendations.

## 2024-06-28 NOTE — Progress Notes (Signed)
 Subjective:   Teresa Rangel is a 83 y.o. who presents for a Medicare Wellness preventive visit.  As a reminder, Annual Wellness Visits don't include a physical exam, and some assessments may be limited, especially if this visit is performed virtually. We may recommend an in-person follow-up visit with your provider if needed.  Visit Complete: Virtual I connected with  Teresa Rangel on 06/28/24 by a audio enabled telemedicine application and verified that I am speaking with the correct person using two identifiers.  Patient Location: Home  Provider Location: Office/Clinic  I discussed the limitations of evaluation and management by telemedicine. The patient expressed understanding and agreed to proceed.  Vital Signs: Because this visit was a virtual/telehealth visit, some criteria may be missing or patient reported. Any vitals not documented were not able to be obtained and vitals that have been documented are patient reported.    Persons Participating in Visit: Patient.  AWV Questionnaire: No: Patient Medicare AWV questionnaire was not completed prior to this visit.  Cardiac Risk Factors include: advanced age (>36men, >88 women);dyslipidemia;hypertension     Objective:    Today's Vitals   06/28/24 1343  Weight: 158 lb (71.7 kg)  Height: 5' 4 (1.626 m)   Body mass index is 27.12 kg/m.     06/28/2024    1:50 PM 04/25/2024    8:04 AM 06/23/2023    1:04 PM 10/08/2022    8:03 AM 08/28/2022   10:36 PM 05/30/2021   12:04 PM 05/02/2020   10:29 AM  Advanced Directives  Does Patient Have a Medical Advance Directive? Yes Yes Yes Yes No Yes Yes  Type of Estate agent of [place];Living will  Healthcare Power of [place];Living will Living will  Healthcare Power of [place];Living will Healthcare Power of [place];Living will  Does patient want to make changes to medical advance directive?      No - Patient declined   Copy of Healthcare Power of  Attorney in Chart? No - copy requested  No - copy requested   No - copy requested No - copy requested  Would patient like information on creating a medical advance directive?     No - Patient declined      Current Medications (verified) Outpatient Encounter Medications as of 06/28/2024  Medication Sig   albuterol  (VENTOLIN  HFA) 108 (90 Base) MCG/ACT inhaler Inhale 1-2 puffs into the lungs every 6 (six) hours as needed for wheezing or shortness of breath.   amLODipine  (NORVASC ) 10 MG tablet Take 1 tablet (10 mg total) by mouth daily.   carvedilol  (COREG ) 6.25 MG tablet TAKE 1 TABLET BY MOUTH 2 TIMES A DAY   FLUoxetine  (PROZAC ) 20 MG capsule TAKE 1 CAPSULE BY MOUTH EVERY MORNING   fluticasone  (FLONASE ) 50 MCG/ACT nasal spray Place 2 sprays into both nostrils daily.   fluticasone  furoate-vilanterol (BREO ELLIPTA ) 200-25 MCG/ACT AEPB Inhale 1 puff into the lungs daily.   hydrocortisone  (ANUSOL -HC) 2.5 % rectal cream Place 1 Application rectally 3 (three) times daily.   irbesartan  (AVAPRO ) 75 MG tablet Take 1 tablet (75 mg total) by mouth daily.   levothyroxine  (SYNTHROID ) 88 MCG tablet TAKE 1 TABLET BY MOUTH DAILY BEFORE BREAKFAST   Multiple Vitamins-Minerals (PRESERVISION AREDS 2 PO) Take 1 tablet by mouth daily.   pravastatin  (PRAVACHOL ) 80 MG tablet TAKE 1 TABLET BY MOUTH EVERY EVENING   TURMERIC PO Take 1 tablet by mouth daily.   No facility-administered encounter medications on file as of 06/28/2024.    Allergies (  verified) Molds & smuts and Tetracyclines & related   History: Past Medical History:  Diagnosis Date   Allergy    History of stomach ulcers 1980   Hyperlipidemia    Hypertension    Hypothyroidism    Macular degeneration of left eye    OSA (obstructive sleep apnea)    mild obstructive sleep apnea with an AHI of 10/hr.  Auto CPAP from 4 to 15 cm H2O   Osteoporosis    Renal artery stenosis    right >60% and left on upper end of 1-59% by dopplers 1/24   Sleep apnea     Tumor of thyroid  08/23/2019   Benign    Past Surgical History:  Procedure Laterality Date   APPENDECTOMY  1954   COLONOSCOPY     LAPAROSCOPY  1982   THYROIDECTOMY  2007   TONSILLECTOMY  1949   Family History  Problem Relation Age of Onset   Colon cancer Sister    Breast cancer Maternal Aunt 50   Stomach cancer Neg Hx    Rectal cancer Neg Hx    Esophageal cancer Neg Hx    Social History   Socioeconomic History   Marital status: Married    Spouse name: Not on file   Number of children: 2   Years of education: Not on file   Highest education level: Not on file  Occupational History   Occupation: Retired  Tobacco Use   Smoking status: Never   Smokeless tobacco: Never  Vaping Use   Vaping status: Never Used  Substance and Sexual Activity   Alcohol use: Not Currently    Alcohol/week: 1.0 standard drink of alcohol    Types: 1 Glasses of wine per week    Comment: occassional   Drug use: No   Sexual activity: Yes    Partners: Male  Other Topics Concern   Not on file  Social History Narrative   Caffeine 1 cup a day   Are you right handed or left handed? Right   Are you currently employed ?    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 husband   Who lives with you?    What type of home do you live in: 1 story or 2 story? two       Social Drivers of Corporate investment banker Strain: Low Risk  (06/28/2024)   Overall Financial Resource Strain (CARDIA)    Difficulty of Paying Living Expenses: Not hard at all  Food Insecurity: No Food Insecurity (06/28/2024)   Hunger Vital Sign    Worried About Running Out of Food in the Last Year: Never true    Ran Out of Food in the Last Year: Never true  Transportation Needs: No Transportation Needs (06/28/2024)   PRAPARE - Administrator, Civil Service (Medical): No    Lack of Transportation (Non-Medical): No  Physical Activity: Insufficiently Active (06/28/2024)   Exercise Vital  Sign    Days of Exercise per Week: 2 days    Minutes of Exercise per Session: 20 min  Stress: No Stress Concern Present (06/28/2024)   [employer] of Occupational Health - Occupational Stress Questionnaire    Feeling of Stress: Not at all  Social Connections: Socially Integrated (06/28/2024)   Social Connection and Isolation Panel    Frequency of Communication with Friends and Family: More than three times a week    Frequency of Social Gatherings with Friends and Family: More  than three times a week    Attends Religious Services: More than 4 times per year    Active Member of Clubs or Organizations: Yes    Attends Banker Meetings: 1 to 4 times per year    Marital Status: Married    Tobacco Counseling Counseling given: Not Answered    Clinical Intake:  Pre-visit preparation completed: Yes  Pain : No/denies pain     BMI - recorded: 27.12 Nutritional Status: BMI 25 -29 Overweight Nutritional Risks: None Diabetes: No  No results found for: HGBA1C   How often do you need to have someone help you when you read instructions, pamphlets, or other written materials from your doctor or pharmacy?: 1 - Never  Interpreter Needed?: No  Information entered by :: Teresa Haws, LPN   Activities of Daily Living     06/28/2024    1:46 PM  In your present state of health, do you have any difficulty performing the following activities:  Hearing? 0  Vision? 1  Comment left eye macular degenration being treated  Difficulty concentrating or making decisions? 0  Walking or climbing stairs? 0  Dressing or bathing? 0  Doing errands, shopping? 0  Preparing Food and eating ? N  Using the Toilet? N  In the past six months, have you accidently leaked urine? Y  Comment wears a brief or pad  Do you have problems with loss of bowel control? Y  Managing your Medications? N  Managing your Finances? N  Housekeeping or managing your Housekeeping? N    Patient Care  Team: Allwardt, Mardy HERO, PA-C as PCP - General (Physician Assistant) Shlomo Wilbert SAUNDERS, MD as PCP - Cardiology (Cardiology) McKenzie, Belvie CROME, MD as Consulting Physician (Urology) Josh Server, MD as Referring Physician (Ophthalmology) Hunsucker, Donnice SAUNDERS, MD as Consulting Physician (Pulmonary Disease)  I have updated your Care Teams any recent Medical Services you may have received from other providers in the past year.     Assessment:   This is a routine wellness examination for Teresa Rangel.  Hearing/Vision screen Hearing Screening - Comments:: Pt denies any hearing issues  Vision Screening - Comments:: Wears rx glasses - up to date with routine eye exams with Dr Robinson and Dr Josh for macular degeneration left eye    Goals Addressed   None    Depression Screen     06/28/2024    1:47 PM 03/23/2024   10:06 AM 06/23/2023    1:06 PM 05/31/2022    1:54 PM 05/30/2021   11:59 AM 05/26/2021    8:34 AM 02/06/2021    2:40 PM  PHQ 2/9 Scores  PHQ - 2 Score 0 3 0 0 0 0 1  PHQ- 9 Score  15     5    Fall Risk     06/28/2024    1:50 PM 03/23/2024    9:23 AM 01/10/2024    9:48 AM 06/22/2023    1:55 PM 10/08/2022    8:02 AM  Fall Risk   Falls in the past year? 1 1 1  0 1  Number falls in past yr: 0 0 1 0 0  Injury with Fall? 1 1  0 0  Comment broke a tooth      Risk for fall due to : History of fall(s) Impaired balance/gait     Risk for fall due to: Comment  shoes slipped off after stepping over speed bump and caused fall     Follow up Falls prevention  discussed Falls evaluation completed   Falls evaluation completed    MEDICARE RISK AT HOME:  Medicare Risk at Home Any stairs in or around the home?: Yes If so, are there any without handrails?: No Home free of loose throw rugs in walkways, pet beds, electrical cords, etc?: Yes Adequate lighting in your home to reduce risk of falls?: Yes Life alert?: No Use of a cane, walker or w/c?: No Grab bars in the bathroom?: Yes Shower chair  or bench in shower?: Yes Elevated toilet seat or a handicapped toilet?: Yes  TIMED UP AND GO:  Was the test performed?  No  Cognitive Function: 6CIT completed        06/28/2024    1:52 PM 06/23/2023    1:07 PM 05/31/2022    1:57 PM 05/30/2021   12:09 PM 05/02/2020   10:41 AM  6CIT Screen  What Year? 0 points 0 points 0 points 0 points 0 points  What month? 0 points 0 points 0 points 0 points 0 points  What time? 0 points 0 points 0 points 0 points   Count back from 20 0 points 0 points 0 points 0 points 0 points  Months in reverse 0 points 0 points 0 points 0 points 0 points  Repeat phrase 0 points 0 points 0 points 0 points 0 points  Total Score 0 points 0 points 0 points 0 points     Immunizations Immunization History  Administered Date(s) Administered   Fluad Quad(high Dose 65+) 05/28/2021, 07/01/2022   INFLUENZA, HIGH DOSE SEASONAL PF 08/04/2019   Influenza,trivalent, recombinat, inj, PF 09/01/2023   Influenza-Unspecified 06/28/2024   PFIZER(Purple Top)SARS-COV-2 Vaccination 10/22/2019, 11/16/2019   Pneumococcal Conjugate-13 07/07/2018   Pneumococcal Polysaccharide-23 06/07/2013   Tdap 05/23/2023   Tetanus 06/07/2013   Zoster Recombinant(Shingrix) 04/12/2022, 07/22/2022    Screening Tests Health Maintenance  Topic Date Due   COVID-19 Vaccine (3 - Pfizer risk series) 12/14/2019   Mammogram  05/04/2025   Medicare Annual Wellness (AWV)  06/28/2025   DTaP/Tdap/Td (2 - Td or Tdap) 05/22/2033   Pneumococcal Vaccine: 50+ Years  Completed   Influenza Vaccine  Completed   DEXA SCAN  Completed   Zoster Vaccines- Shingrix  Completed   Meningococcal B Vaccine  Aged Out    Health Maintenance Items Addressed: See Nurse Notes at the end of this note  Additional Screening:  Vision Screening: Recommended annual ophthalmology exams for early detection of glaucoma and other disorders of the eye. Is the patient up to date with their annual eye exam?  Yes  Who is the  provider or what is the name of the office in which the patient attends annual eye exams? Dr Robinson   Dental Screening: Recommended annual dental exams for proper oral hygiene  Community Resource Referral / Chronic Care Management: CRR required this visit?  No   CCM required this visit?  No   Plan:    I have personally reviewed and noted the following in the patient's chart:   Medical and social history Use of alcohol, tobacco or illicit drugs  Current medications and supplements including opioid prescriptions. Patient is not currently taking opioid prescriptions. Functional ability and status Nutritional status Physical activity Advanced directives List of other physicians Hospitalizations, surgeries, and ER visits in previous 12 months Vitals Screenings to include cognitive, depression, and falls Referrals and appointments  In addition, I have reviewed and discussed with patient certain preventive protocols, quality metrics, and best practice recommendations. A written personalized care plan  for preventive services as well as general preventive health recommendations were provided to patient.   Teresa VEAR Haws, LPN   [id number]   After Visit Summary: (MyChart) Due to this being a telephonic visit, the after visit summary with patients personalized plan was offered to patient via MyChart   Notes: Nothing significant to report at this time.

## 2024-07-01 ENCOUNTER — Encounter: Payer: Self-pay | Admitting: Physician Assistant

## 2024-07-02 ENCOUNTER — Other Ambulatory Visit: Payer: Self-pay

## 2024-07-02 DIAGNOSIS — H353221 Exudative age-related macular degeneration, left eye, with active choroidal neovascularization: Secondary | ICD-10-CM | POA: Diagnosis not present

## 2024-07-02 MED ORDER — FLUOXETINE HCL 40 MG PO CAPS: 40.0000 mg | ORAL_CAPSULE | Freq: Every day | ORAL | 0 refills | Status: DC

## 2024-07-02 NOTE — Telephone Encounter (Signed)
 Ok to advise pt needs an appt to discuss?

## 2024-07-04 ENCOUNTER — Encounter: Admitting: Physical Therapy

## 2024-07-10 DIAGNOSIS — R3915 Urgency of urination: Secondary | ICD-10-CM | POA: Diagnosis not present

## 2024-07-10 DIAGNOSIS — N3020 Other chronic cystitis without hematuria: Secondary | ICD-10-CM | POA: Diagnosis not present

## 2024-07-10 DIAGNOSIS — N3281 Overactive bladder: Secondary | ICD-10-CM | POA: Diagnosis not present

## 2024-07-10 DIAGNOSIS — R399 Unspecified symptoms and signs involving the genitourinary system: Secondary | ICD-10-CM | POA: Diagnosis not present

## 2024-07-10 DIAGNOSIS — R350 Frequency of micturition: Secondary | ICD-10-CM | POA: Diagnosis not present

## 2024-07-11 ENCOUNTER — Ambulatory Visit: Admitting: Physical Therapy

## 2024-07-11 DIAGNOSIS — R279 Unspecified lack of coordination: Secondary | ICD-10-CM

## 2024-07-11 DIAGNOSIS — M6281 Muscle weakness (generalized): Secondary | ICD-10-CM

## 2024-07-11 DIAGNOSIS — R293 Abnormal posture: Secondary | ICD-10-CM

## 2024-07-11 NOTE — Therapy (Signed)
 OUTPATIENT PHYSICAL THERAPY FEMALE PELVIC TREATMENT   Patient Name: Teresa Rangel MRN: [id number] DOB:1940-12-25, 83 y.o., female Today's Date: 07/11/2024  END OF SESSION:  PT End of Session - 07/11/24 0926     Visit Number 4    Number of Visits 10    Date for Recertification  07/04/24    Authorization Type HTA    PT Start Time 0845    PT Stop Time 0930    PT Time Calculation (min) 45 min    Activity Tolerance Patient tolerated treatment well    Behavior During Therapy [facility] for tasks assessed/performed             Past Medical History:  Diagnosis Date   Allergy    History of stomach ulcers 1980   Hyperlipidemia    Hypertension    Hypothyroidism    Macular degeneration of left eye    OSA (obstructive sleep apnea)    mild obstructive sleep apnea with an AHI of 10/hr.  Auto CPAP from 4 to 15 cm H2O   Osteoporosis    Renal artery stenosis    right >60% and left on upper end of 1-59% by dopplers 1/24   Sleep apnea    Tumor of thyroid  08/23/2019   Benign    Past Surgical History:  Procedure Laterality Date   APPENDECTOMY  1954   COLONOSCOPY     LAPAROSCOPY  1982   THYROIDECTOMY  2007   TONSILLECTOMY  1949   Patient Active Problem List   Diagnosis Date Noted   Flatulence 04/06/2024   Altered bowel habits 04/06/2024   Hemorrhoids 04/06/2024   Family history of colon cancer 04/06/2024   Cough variant asthma 01/10/2024   Upper airway cough syndrome 01/10/2024   Allergic rhinitis 01/10/2024   Degenerative disorder of musculoskeletal system 01/03/2024   Low back pain 01/03/2024   Lumbar radiculopathy 01/03/2024   Spondylolisthesis of lumbar region 01/03/2024   Heberden's nodes (with arthropathy) 11/30/2023   Pain in right hand 11/30/2023   Pain of left hand 11/30/2023   Anxiety and depression 04/12/2023   OSA (obstructive sleep apnea) 10/13/2022   Renal artery stenosis 09/22/2022   Primary insomnia 02/02/2022   Bronchitis 08/18/2021   Macular  degeneration disease 06/04/2020   Chronic cystitis 09/20/2019   Gout 05/10/2019   Osteoporosis 05/07/2019   Varicose veins of both lower extremities with pain 12/06/2018   Dyslipidemia 06/07/2013   Essential hypertension 08/24/2012   Hypothyroidism 08/24/2012   Vitamin D  deficiency 08/24/2012    PCP: Allwardt, Mardy HERO, PA-C  REFERRING PROVIDER: Avram Lupita BRAVO, MD   REFERRING DIAG: R19.4 (ICD-10-CM) - Altered bowel habits  THERAPY DIAG:  Muscle weakness (generalized)  Unspecified lack of coordination  Abnormal posture  Rationale for Evaluation and Treatment: Rehabilitation  ONSET DATE: after childbirth   SUBJECTIVE:  SUBJECTIVE STATEMENT: Patient reports that she is doing well today. Her symptoms have been improving. She reports that her main problem is gas, and she knows there are certain foods that trigger this, so she has been avoiding certain foods that trigger her. Last week, she could not complete her Bms due to her hemorrhoids, so then she increased her fiber and did her exercises and the past three days, she has been feeling more regular. She also got a topical antibiotic for her external hemorrhoids and it seems to helping. Pain with pooping has been 5/10 when she passes bowel movements.   Eval: Patient reports to PFPT with bad hemorrhoids that she has had since the birth of her children, and they have gotten worse. She has difficulty having bowel movements and she has to return to the bathroom multiple times before she feels empty. She had a colonoscopy 10 days ago and it came back all clear other than some polyps that were benign that were removed. Dr. Avram thinks that she is experiencing prolapse. She is now taking 2 teaspoons of benefiber and it doesn't cause any bloating/gas for her.  She is currently following the FODMAP diet for her digestion and this makes a difference with her bowel habits/gas.  Fluid intake: drinks 3-4 bottles of water daily (16.9 fl oz), she adds a liquid IV to her water; coffee in the morning (1-1.5 cups), collagen in coffee, occasionally a dr pepper   PAIN:  Are you having pain? No NPRS scale: 0/10  PRECAUTIONS: None  RED FLAGS: None   WEIGHT BEARING RESTRICTIONS: No  FALLS:  Has patient fallen in last 6 months? Yes. 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   ACTIVITY LEVEL : low level of physical activity - tries to walk daily   PLOF: Independent with basic ADLs  PATIENT GOALS: better health, more control of gas/feces,    BOWEL MOVEMENT: Pain with bowel movement: No Type of bowel movement:Type (Bristol Stool Scale) 4, Frequency daily, Strain no, and Splinting no Fully empty rectum: No - could return 2 or 3 times after initial bowel movement  Leakage: Yes: if she doesn't get there in time  Pads: Yes: wears one just in case she has fecal incontinence  Fiber supplement/laxative Yes - 2 tablespoons of benefiber that is working well for her   URINATION: Pain with urination: No Fully empty bladder: Yes:   Stream: Weak Urgency: Yes  Frequency: within normal limits  Leakage: none Pads: Yes: fear of fecal incontinence  INTERCOURSE: not currently sexually active   Ability to have vaginal penetration Yes  Pain with intercourse: none DrynessYes   PREGNANCY: Vaginal deliveries 2  PROLAPSE: None - but her urologist says she has some bladder prolapse   OBJECTIVE:  Note: Objective measures were completed at Evaluation unless otherwise noted.  PATIENT SURVEYS:  PFIQ-7: 45  COGNITION: Overall cognitive status: Within functional limits for tasks assessed     SENSATION: Light touch: Appears intact  LUMBAR SPECIAL  TESTS:  Single leg stance test: Positive  FUNCTIONAL TESTS:  Squat: bilateral dynamic knee valgus with loading, general lumbopelvic stiffness with transfers   GAIT: Assistive device utilized: None Comments: moderate trendelenburg gait pattern with ambulation   POSTURE: rounded shoulders, forward head, and flexed trunk   LUMBARAROM/PROM: within normal limits for all motions bilaterally with no pain   LOWER EXTREMITY ROM: within normal limits  for all motions bilaterally with no pain   LOWER EXTREMITY MMT: 4/5  bilateral knees and hips grossly   PALPATION:   General: no tenderness to palpation of bilateral adductors or hip flexors   Pelvic Alignment: within normal limits   Abdominal: upper chest breathing, abdominal bracing at rest, decreased lower rib excursion with inhalation                 External Perineal Exam: patient has significant external hemorrhoids present at external anal sphincter, dryness present                              Internal Pelvic Floor: Patient fully consents to today's internal rectal examination. She demonstrates low muscle tone at external and internal anal sphincter. She has a weak pelvic floor contraction and tends to bear down when contracting rather than lifting the muscles. She had no pain or palpable trigger points with today's exam, no stool on glove following exam.   Patient confirms identification and approves PT to assess internal pelvic floor and treatment Yes No emotional/communication barriers or cognitive limitation. Patient is motivated to learn. Patient understands and agrees with treatment goals and plan. PT explains patient will be examined in standing, sitting, and lying down to see how their muscles and joints work. When they are ready, they will be asked to remove their underwear so PT can examine their perineum. The patient is also given the option of providing their own chaperone as one is not provided in our facility. The patient also  has the right and is explained the right to defer or refuse any part of the evaluation or treatment including the internal exam. With the patient's consent, PT will use one gloved finger to gently assess the muscles of the pelvic floor, seeing how well it contracts and relaxes and if there is muscle symmetry. After, the patient will get dressed and PT and patient will discuss exam findings and plan of care. PT and patient discuss plan of care, schedule, attendance policy and HEP activities.   PELVIC MMT:   MMT eval  Vaginal 3/5 with weakness and lack of coordination   Internal Anal Sphincter 2/5  External Anal Sphincter 2/5  Puborectalis   Diastasis Recti   (Blank rows = not tested)  TONE: Low at internal and external anal sphincter  PROLAPSE: Not assessed in sidelying position today   TODAY'S TREATMENT:                                                                                                                              DATE:   05/24/24: Internal vaginal examination and prolapse assessment: patient demonstrates grade 2 uterine prolapse in hooklying prolapse testing position  - Seated Pelvic Floor Contraction  - 1 x daily - 7 x weekly - 2 sets - 10 reps - Seated Quick Flick Pelvic Floor Contractions  - 1 x  daily - 7 x weekly - 2 sets - 10 reps - Pelvic Floor Muscle Contraction and Pelvic Tilt With Hips Elevated (for Pelvic Organ Prolapse)  - 1 x daily - 7 x weekly - 2 sets - 10 reps  06/27/24: Standing pelvic floor contraction + diaphragmatic breathing 2x10  Seated pelvic floor contraction + diaphragmatic breathing 2x10  Seated quick flick contractions + diaphragmatic breathing 2x10  Sit to stand + adductor ball squeeze + diaphragmatic breathing (kegel at top of motion) 2x10  Bridge + adductor ball squeeze + diaphragmatic breathing (kegel at top of motion) 2x10  Sidelying clamshell + reverse clamshell + diaphragmatic breathing 2x12 each  Urge drill for managing fecal  incontinence in the mornings when trying to get to the bathroom on time   07/11/24: Seated pelvic floor contraction + diaphragmatic breathing x10  Standing pelvic floor contraction + diaphragmatic breathing x10  Seated quick flick contractions + diaphragmatic breathing x20  Sit to stand + adductor ball squeeze + diaphragmatic breathing (kegel at top of motion) x10  Bridge + adductor ball squeeze + diaphragmatic breathing (kegel at top of motion) 2x10  Seated clamshells (BlueTB) + diaphragmatic breathing 2x15 Seated internal rotation (no additional resistance) + hip adduction against ball + diaphragmatic breathing 2x10   PATIENT EDUCATION:  Education details: relative anatomy and the connection between the diaphragm and pelvic floor, how water intake and fiber intake affect stool consistency and emptying  Person educated: Patient Education method: Explanation, Demonstration, Tactile cues, Verbal cues, and Handouts Education comprehension: verbalized understanding, returned demonstration, verbal cues required, tactile cues required, and needs further education  HOME EXERCISE PROGRAM: Access Code: [email or web address] URL: [email or web address] Date: 07/11/2024 Prepared by: Celena Domino  Exercises - Seated Pelvic Floor Contraction  - 1 x daily - 7 x weekly - 1 sets - 10 reps - Standing Pelvic Floor Contraction  - 1 x daily - 7 x weekly - 1 sets - 10 reps - Seated Quick Flick Pelvic Floor Contractions  - 1 x daily - 7 x weekly - 2 sets - 15 reps - Pelvic Floor Muscle Contraction and Pelvic Tilt With Hips Elevated (for Pelvic Organ Prolapse)  - 1 x daily - 7 x weekly - 2 sets - 10 reps - Sit to Stand with Mercer Between Knees  - 1 x daily - 7 x weekly - 2 sets - 10 reps - Supine Bridge with Mini Swiss Ball Between Knees  - 1 x daily - 7 x weekly - 2 sets - 10 reps - Seated Hip Internal Rotation with Ball and Resistance  - 1 x daily - 7 x weekly - 2 sets - 10 reps - Seated Hip Abduction with  Resistance  - 1 x daily - 7 x weekly - 2 sets - 10 reps  Patient Education - Bowel Emptying Techniques - Bowel Emptying Techniques  ASSESSMENT:  CLINICAL IMPRESSION: Patient is a 83 y.o. female  who was seen today for physical therapy treatment for bowel dysfunction including incontinence and difficulty emptying bowels. Pt has been noticing improvements in bowel discomfort and regularity of bowel movements. She tolerated all gluteal strengthening progressions well and could feel a much stronger connection to her glutes than she has in the past today. No pain at end of session, no fecal urgency or incontinence during today's session. Overall, patient tolerated session well and Pt would benefit from additional PT to further address deficits.    OBJECTIVE IMPAIRMENTS: decreased coordination, decreased endurance, decreased mobility, decreased ROM, and decreased strength.  ACTIVITY LIMITATIONS: continence  PARTICIPATION LIMITATIONS: community activity  PERSONAL FACTORS: Age, Past/current experiences, and Time since onset of injury/illness/exacerbation are also affecting patient's functional outcome.   REHAB POTENTIAL: Good  CLINICAL DECISION MAKING: Stable/uncomplicated  EVALUATION COMPLEXITY: Low   GOALS: Goals reviewed with patient? Yes  SHORT TERM GOALS: Target date: 05/23/2024  Pt will be independent with HEP.  Baseline: Goal status: INITIAL  2.  Pt will be independent with use of squatty potty, relaxed toileting mechanics, and improved bowel movement techniques in order to increase ease of bowel movements and complete evacuation.  Baseline:  Goal status: INITIAL  3.  Pt will be independent with the knack, urge suppression technique, and double voiding in order to improve bladder habits and decrease urinary incontinence.  Baseline:  Goal status: INITIAL  4.  Pt will report her BMs are complete due to improved bowel habits and evacuation techniques.  Baseline:  Goal status:  INITIAL  LONG TERM GOALS: Target date: 10/26/2024  Pt will be independent with advanced HEP.  Baseline:  Goal status: INITIAL  2.  Pt to demonstrate improved coordination of pelvic floor and breathing mechanics with 10# squat with appropriate synergistic patterns to decrease pain and leakage at least 75% of the time for improved ability to complete a 30 minute workout with strain at pelvic floor and symptoms.   Baseline:  Goal status: INITIAL  3.  Pt will report 75% less abdominal bloating and discomfort due to improved muscle tone throughout the core and pelvic floor to improve quality of life and flatulence.  Baseline:  Goal status: INITIAL  4.  Pt will demonstrate 3/5 pelvic floor muscle strength with appropriate coordination in order to decrease urinary incontinence, urgency and frequency.   Baseline:  Goal status: INITIAL  PLAN:  PT FREQUENCY: 1-2x/week  PT DURATION: 12 weeks  PLANNED INTERVENTIONS: 97110-Therapeutic exercises, 97530- Therapeutic activity, 97112- Neuromuscular re-education, 97535- Self Care, [id number]- Manual therapy, Patient/Family education, Taping, Joint mobilization, Spinal mobilization, Scar mobilization, Cryotherapy, and Moist heat  PLAN FOR NEXT SESSION: continued pelvic floor AROM training in seated, introduce gluteal strengthening, discuss toileting and emptying techniques, knack technique   Celena JAYSON Domino, PT 07/11/2024, 9:28 AM

## 2024-07-14 ENCOUNTER — Other Ambulatory Visit: Payer: Self-pay | Admitting: Nurse Practitioner

## 2024-07-14 DIAGNOSIS — J4530 Mild persistent asthma, uncomplicated: Secondary | ICD-10-CM

## 2024-07-18 ENCOUNTER — Ambulatory Visit: Attending: Internal Medicine | Admitting: Physical Therapy

## 2024-07-18 DIAGNOSIS — R293 Abnormal posture: Secondary | ICD-10-CM | POA: Diagnosis not present

## 2024-07-18 DIAGNOSIS — R279 Unspecified lack of coordination: Secondary | ICD-10-CM | POA: Insufficient documentation

## 2024-07-18 DIAGNOSIS — M6281 Muscle weakness (generalized): Secondary | ICD-10-CM | POA: Diagnosis not present

## 2024-07-18 NOTE — Therapy (Signed)
 OUTPATIENT PHYSICAL THERAPY FEMALE PELVIC TREATMENT   Patient Name: Teresa Rangel MRN: [id number] DOB:18-Jan-1941, 83 y.o., female Today's Date: 07/18/2024  END OF SESSION:       Past Medical History:  Diagnosis Date   Allergy    History of stomach ulcers 1980   Hyperlipidemia    Hypertension    Hypothyroidism    Macular degeneration of left eye    OSA (obstructive sleep apnea)    mild obstructive sleep apnea with an AHI of 10/hr.  Auto CPAP from 4 to 15 cm H2O   Osteoporosis    Renal artery stenosis    right >60% and left on upper end of 1-59% by dopplers 1/24   Sleep apnea    Tumor of thyroid  08/23/2019   Benign    Past Surgical History:  Procedure Laterality Date   APPENDECTOMY  1954   COLONOSCOPY     LAPAROSCOPY  1982   THYROIDECTOMY  2007   TONSILLECTOMY  1949   Patient Active Problem List   Diagnosis Date Noted   Flatulence 04/06/2024   Altered bowel habits 04/06/2024   Hemorrhoids 04/06/2024   Family history of colon cancer 04/06/2024   Cough variant asthma 01/10/2024   Upper airway cough syndrome 01/10/2024   Allergic rhinitis 01/10/2024   Degenerative disorder of musculoskeletal system 01/03/2024   Low back pain 01/03/2024   Lumbar radiculopathy 01/03/2024   Spondylolisthesis of lumbar region 01/03/2024   Heberden's nodes (with arthropathy) 11/30/2023   Pain in right hand 11/30/2023   Pain of left hand 11/30/2023   Anxiety and depression 04/12/2023   OSA (obstructive sleep apnea) 10/13/2022   Renal artery stenosis 09/22/2022   Primary insomnia 02/02/2022   Bronchitis 08/18/2021   Macular degeneration disease 06/04/2020   Chronic cystitis 09/20/2019   Gout 05/10/2019   Osteoporosis 05/07/2019   Varicose veins of both lower extremities with pain 12/06/2018   Dyslipidemia 06/07/2013   Essential hypertension 08/24/2012   Hypothyroidism 08/24/2012   Vitamin D  deficiency 08/24/2012    PCP: Allwardt, Mardy HERO, PA-C  REFERRING PROVIDER:  Avram Lupita BRAVO, MD   REFERRING DIAG: R19.4 (ICD-10-CM) - Altered bowel habits  THERAPY DIAG:  Muscle weakness (generalized)  Unspecified lack of coordination  Abnormal posture  Rationale for Evaluation and Treatment: Rehabilitation  ONSET DATE: after childbirth   SUBJECTIVE:                                                                                                                                                                                           SUBJECTIVE STATEMENT: Patient reports that she is doing well today. She thinks she needs  to do her HEP more often - she has done them but not enough. She doesn't have to spend the entire morning going back and forth to the bathroom which she is pleased with. Gas has been better which she is also pleased with. Her hemorrhoids have been managed with Aquaphor -  internally and externally. This provides good relief. Pain with pooping has been a 3/10. She is having more formed stools when she passes Bms. She has not been urinating much during the day, but gets up 4x/night. No urinary leakage to report.   Eval: Patient reports to PFPT with bad hemorrhoids that she has had since the birth of her children, and they have gotten worse. She has difficulty having bowel movements and she has to return to the bathroom multiple times before she feels empty. She had a colonoscopy 10 days ago and it came back all clear other than some polyps that were benign that were removed. Dr. Avram thinks that she is experiencing prolapse. She is now taking 2 teaspoons of benefiber and it doesn't cause any bloating/gas for her. She is currently following the FODMAP diet for her digestion and this makes a difference with her bowel habits/gas.  Fluid intake: drinks 3-4 bottles of water daily (16.9 fl oz), she adds a liquid IV to her water; coffee in the morning (1-1.5 cups), collagen in coffee, occasionally a dr pepper   PAIN:  Are you having pain? No NPRS scale:  0/10  PRECAUTIONS: None  RED FLAGS: None   WEIGHT BEARING RESTRICTIONS: No  FALLS:  Has patient fallen in last 6 months? Yes. 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   ACTIVITY LEVEL : low level of physical activity - tries to walk daily   PLOF: Independent with basic ADLs  PATIENT GOALS: better health, more control of gas/feces,    BOWEL MOVEMENT: Pain with bowel movement: No Type of bowel movement:Type (Bristol Stool Scale) 4, Frequency daily, Strain no, and Splinting no Fully empty rectum: No - could return 2 or 3 times after initial bowel movement  Leakage: Yes: if she doesn't get there in time  Pads: Yes: wears one just in case she has fecal incontinence  Fiber supplement/laxative Yes - 2 tablespoons of benefiber that is working well for her   URINATION: Pain with urination: No Fully empty bladder: Yes:   Stream: Weak Urgency: Yes  Frequency: within normal limits  Leakage: none Pads: Yes: fear of fecal incontinence  INTERCOURSE: not currently sexually active   Ability to have vaginal penetration Yes  Pain with intercourse: none DrynessYes   PREGNANCY: Vaginal deliveries 2  PROLAPSE: None - but her urologist says she has some bladder prolapse   OBJECTIVE:  Note: Objective measures were completed at Evaluation unless otherwise noted.  PATIENT SURVEYS:  PFIQ-7: 45  COGNITION: Overall cognitive status: Within functional limits for tasks assessed     SENSATION: Light touch: Appears intact  LUMBAR SPECIAL TESTS:  Single leg stance test: Positive  FUNCTIONAL TESTS:  Squat: bilateral dynamic knee valgus with loading, general lumbopelvic stiffness with transfers   GAIT: Assistive device utilized: None Comments: moderate trendelenburg gait pattern with ambulation   POSTURE: rounded shoulders, forward head, and flexed trunk    LUMBARAROM/PROM: within normal limits for all motions bilaterally with no pain   LOWER EXTREMITY ROM: within normal limits for all motions bilaterally with no pain  LOWER EXTREMITY MMT: 4/5  bilateral knees and hips grossly   PALPATION:   General: no tenderness to palpation of bilateral adductors or hip flexors   Pelvic Alignment: within normal limits   Abdominal: upper chest breathing, abdominal bracing at rest, decreased lower rib excursion with inhalation                 External Perineal Exam: patient has significant external hemorrhoids present at external anal sphincter, dryness present                              Internal Pelvic Floor: Patient fully consents to today's internal rectal examination. She demonstrates low muscle tone at external and internal anal sphincter. She has a weak pelvic floor contraction and tends to bear down when contracting rather than lifting the muscles. She had no pain or palpable trigger points with today's exam, no stool on glove following exam.   Patient confirms identification and approves PT to assess internal pelvic floor and treatment Yes No emotional/communication barriers or cognitive limitation. Patient is motivated to learn. Patient understands and agrees with treatment goals and plan. PT explains patient will be examined in standing, sitting, and lying down to see how their muscles and joints work. When they are ready, they will be asked to remove their underwear so PT can examine their perineum. The patient is also given the option of providing their own chaperone as one is not provided in our facility. The patient also has the right and is explained the right to defer or refuse any part of the evaluation or treatment including the internal exam. With the patient's consent, PT will use one gloved finger to gently assess the muscles of the pelvic floor, seeing how well it contracts and relaxes and if there is muscle symmetry. After, the patient  will get dressed and PT and patient will discuss exam findings and plan of care. PT and patient discuss plan of care, schedule, attendance policy and HEP activities.   PELVIC MMT:   MMT eval  Vaginal 3/5 with weakness and lack of coordination   Internal Anal Sphincter 2/5  External Anal Sphincter 2/5  Puborectalis   Diastasis Recti   (Blank rows = not tested)  TONE: Low at internal and external anal sphincter  PROLAPSE: Not assessed in sidelying position today   TODAY'S TREATMENT:                                                                                                                              DATE:   06/27/24: Standing pelvic floor contraction + diaphragmatic breathing 2x10  Seated pelvic floor contraction + diaphragmatic breathing 2x10  Seated quick flick contractions + diaphragmatic breathing 2x10  Sit to stand + adductor ball squeeze + diaphragmatic breathing (kegel at top of motion) 2x10  Bridge + adductor ball squeeze + diaphragmatic breathing (kegel at top of motion) 2x10  Sidelying clamshell + reverse clamshell + diaphragmatic breathing 2x12 each  Urge drill for managing fecal incontinence in the mornings when trying to get to the bathroom on time   07/11/24: Seated pelvic floor contraction + diaphragmatic breathing x10  Standing pelvic floor contraction + diaphragmatic breathing x10  Seated quick flick contractions + diaphragmatic breathing x20  Sit to stand + adductor ball squeeze + diaphragmatic breathing (kegel at top of motion) x10  Bridge + adductor ball squeeze + diaphragmatic breathing (kegel at top of motion) 2x10  Seated clamshells (BlueTB) + diaphragmatic breathing 2x15 Seated internal rotation (no additional resistance) + hip adduction against ball + diaphragmatic breathing 2x10   07/18/24: Seated pelvic floor contraction + diaphragmatic breathing x36min  Seated quick flick pelvic floor contractions + diaphragmatic breathing 2x20 -cueing to not  engage gluteals with these  Sit to stand + adductor ball squeeze + diaphragmatic breathing (kegel at top of motion) x5 Mini bodyweight squat + diaphragmatic breathing (glute squeeze at top) 2x5  Seated clamshells + GTB + diaphragmatic breathing 2x12  Seated internal rotation against ball + diaphragmatic breathing 2x10   Bridge + hip abduction (GTB) + diaphragmatic breathing 2x10  Lower trunk rotations + diaphragmatic breathing 2x10   PATIENT EDUCATION:  Education details: relative anatomy and the connection between the diaphragm and pelvic floor, how water intake and fiber intake affect stool consistency and emptying  Person educated: Patient Education method: Explanation, Demonstration, Tactile cues, Verbal cues, and Handouts Education comprehension: verbalized understanding, returned demonstration, verbal cues required, tactile cues required, and needs further education  HOME EXERCISE PROGRAM: Access Code: [email or web address] URL: [email or web address] Date: 07/18/2024 Prepared by: Celena Domino  Exercises - Seated Pelvic Floor Contraction  - 1 x daily - 7 x weekly - 1 sets - 10 reps - Standing Pelvic Floor Contraction  - 1 x daily - 7 x weekly - 1 sets - 10 reps - Seated Quick Flick Pelvic Floor Contractions  - 1 x daily - 7 x weekly - 2 sets - 15 reps - Pelvic Floor Muscle Contraction and Pelvic Tilt With Hips Elevated (for Pelvic Organ Prolapse)  - 1 x daily - 7 x weekly - 2 sets - 10 reps - Mini Squat  - 1 x daily - 7 x weekly - 2 sets - 10 reps - Bridge with Hip Abduction and Resistance  - 1 x daily - 7 x weekly - 2 sets - 10 reps - Seated Hip Internal Rotation with Ball and Resistance  - 1 x daily - 7 x weekly - 2 sets - 16 reps - Seated Hip Abduction with Resistance  - 1 x daily - 7 x weekly - 2 sets - 15 reps - Supine Lower Trunk Rotation  - 1 x daily - 7 x weekly - 2 sets - 10 reps  Patient Education - Bowel Emptying Techniques - Bowel Emptying  Techniques  ASSESSMENT:  CLINICAL IMPRESSION: Patient is a 83 y.o. female  who was seen today for physical therapy treatment for bowel dysfunction including incontinence and difficulty emptying bowels. Pt has been noticing improvements in bowel discomfort and regularity of bowel movements.Pt is getting better at distinguishing between gluteal and pelvic floor contractions in seated. She required cueing to not engage gluteals with quick flick pelvic floor contractions. No pain at end of session, no fecal urgency or incontinence during today's session. Overall, patient tolerated session well and Pt would benefit from additional PT to further address deficits.    OBJECTIVE IMPAIRMENTS: decreased coordination, decreased  endurance, decreased mobility, decreased ROM, and decreased strength.   ACTIVITY LIMITATIONS: continence  PARTICIPATION LIMITATIONS: community activity  PERSONAL FACTORS: Age, Past/current experiences, and Time since onset of injury/illness/exacerbation are also affecting patient's functional outcome.   REHAB POTENTIAL: Good  CLINICAL DECISION MAKING: Stable/uncomplicated  EVALUATION COMPLEXITY: Low   GOALS: Goals reviewed with patient? Yes  SHORT TERM GOALS: Target date: 05/23/2024  Pt will be independent with HEP.  Baseline: Goal status: INITIAL  2.  Pt will be independent with use of squatty potty, relaxed toileting mechanics, and improved bowel movement techniques in order to increase ease of bowel movements and complete evacuation.  Baseline:  Goal status: INITIAL  3.  Pt will be independent with the knack, urge suppression technique, and double voiding in order to improve bladder habits and decrease urinary incontinence.  Baseline:  Goal status: INITIAL  4.  Pt will report her BMs are complete due to improved bowel habits and evacuation techniques.  Baseline:  Goal status: INITIAL  LONG TERM GOALS: Target date: 10/26/2024  Pt will be independent with  advanced HEP.  Baseline:  Goal status: INITIAL  2.  Pt to demonstrate improved coordination of pelvic floor and breathing mechanics with 10# squat with appropriate synergistic patterns to decrease pain and leakage at least 75% of the time for improved ability to complete a 30 minute workout with strain at pelvic floor and symptoms.   Baseline:  Goal status: INITIAL  3.  Pt will report 75% less abdominal bloating and discomfort due to improved muscle tone throughout the core and pelvic floor to improve quality of life and flatulence.  Baseline:  Goal status: INITIAL  4.  Pt will demonstrate 3/5 pelvic floor muscle strength with appropriate coordination in order to decrease urinary incontinence, urgency and frequency.   Baseline:  Goal status: INITIAL  PLAN:  PT FREQUENCY: 1-2x/week  PT DURATION: 12 weeks  PLANNED INTERVENTIONS: 97110-Therapeutic exercises, 97530- Therapeutic activity, 97112- Neuromuscular re-education, 97535- Self Care, [id number]- Manual therapy, Patient/Family education, Taping, Joint mobilization, Spinal mobilization, Scar mobilization, Cryotherapy, and Moist heat  PLAN FOR NEXT SESSION: continued pelvic floor AROM training in seated, introduce gluteal strengthening, discuss toileting and emptying techniques, knack technique   Celena JAYSON Domino, PT 07/18/2024, 9:33 AM

## 2024-07-18 NOTE — Patient Instructions (Addendum)
 Toileting Mechanics and Voiding Schedule: How to ensure you are fully emptying your bladder: Sit all the way down, feet flat on the floor, trunk relaxed  When you start to urinate, do not push the urine out, rather, let it flow naturally  When you think you are done urinating, this is when you get permission to gently push to see if there is any remaining urine in your urethra  This is called the double voiding technique to ensure you are fully emptying before you stand up from the toilet  Voiding schedule: this is your schedule to reset your bladder and hopefully help you empty more during the day and less in the evenings  When you first wake up at 7:00 AM, go straight to the bathroom and empty your bladder  Every 2 hours after 7 am or your initial pee, you will need to go empty your bladder even if you do not feel the urge to go  Do this every 2 hours until you go to bed. Do not do this in the night. Follow this voiding schedule for 1 full week if you can. When you get to the second week, try to increase your voiding intervals to every 3 hours.

## 2024-07-25 ENCOUNTER — Encounter: Admitting: Physical Therapy

## 2024-07-26 ENCOUNTER — Ambulatory Visit (INDEPENDENT_AMBULATORY_CARE_PROVIDER_SITE_OTHER): Admitting: Physician Assistant

## 2024-07-26 ENCOUNTER — Encounter: Payer: Self-pay | Admitting: Physician Assistant

## 2024-07-26 VITALS — BP 120/62 | HR 64 | Temp 97.9°F | Ht 64.0 in | Wt 158.8 lb

## 2024-07-26 DIAGNOSIS — F419 Anxiety disorder, unspecified: Secondary | ICD-10-CM

## 2024-07-26 DIAGNOSIS — E039 Hypothyroidism, unspecified: Secondary | ICD-10-CM | POA: Diagnosis not present

## 2024-07-26 DIAGNOSIS — F32A Depression, unspecified: Secondary | ICD-10-CM | POA: Diagnosis not present

## 2024-07-26 NOTE — Progress Notes (Signed)
 Patient ID: Teresa Rangel, female    DOB: 10-20-1940, 83 y.o.   MRN: [id number]   Assessment & Plan:  Anxiety and depression  Hypothyroidism, unspecified type    Assessment & Plan Anxiety and depression Improved with increased fluoxetine  dosage from 20 mg to 40 mg. Reports feeling better and managing stressors, including family issues and upcoming holidays. Prefers to continue at 40 mg due to improved symptoms and upcoming stressors. - Continue fluoxetine  40 mg daily.  Hypothyroidism Well-managed with current levothyroxine  dosage of 88 mcg. Thyroid  function was last checked in July and was stable. No current symptoms suggestive of thyroid  dysfunction. - Continue levothyroxine  88 mcg daily. - Will schedule thyroid  function test in 6 months.      Return in about 6 months (around 01/23/2025) for recheck/follow-up.    Subjective:    Chief Complaint  Patient presents with   Medication Management    Pt in office to discuss recent increase of Fluoxetine  form 20 mG to 40 mG; pt states things are going better, she had some added stress with grand daughter's pregnancy and health and this has helped her during this time.     HPI Discussed the use of AI scribe software for clinical note transcription with the patient, who gave verbal consent to proceed.  History of Present Illness Teresa Rangel is an 83 year old female who presents for a regular follow-up visit to discuss anxiety and depression.  She has experienced an improvement in her anxiety and depression symptoms following an increase in her fluoxetine  dosage from 20 mg to 40 mg. She describes herself as a 'hyper person' who feels 'everybody's troubles' and has 'too much empathy.' Family stressors, particularly her granddaughter's health issues, have impacted her mental health. Especially with the upcoming holiday season, which has previously exacerbated her symptoms.  She has a history of a prolapsed bladder  and internal hemorrhoids, which cause difficulty at times. She is undergoing pelvic floor therapy to strengthen her muscles. She has not visited a gynecologist since she was 89 and has an upcoming appointment to check her uterus. She also mentions having no anal muscle, which contributes to her symptoms.  She has a history of arthritis, which causes her hip to hurt constantly. She has an appointment with a hip specialist next week to address this issue.  Her granddaughter recently experienced serious preeclampsia and subsequent complications, including blood clots in her lungs, requiring hospitalization and treatment with blood thinners. This situation has been a significant source of stress for her.  Her blood pressure is currently well-controlled, although she recalls a previous episode where it was significantly elevated due to stress.  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     Past Medical History:  Diagnosis Date   Allergy    History of stomach ulcers 1980   Hyperlipidemia    Hypertension    Hypothyroidism    Macular degeneration of left eye    OSA (obstructive sleep apnea)    mild obstructive sleep apnea with an AHI of 10/hr.  Auto CPAP from 4 to 15 cm H2O   Osteoporosis    Pain of right hand 11/30/2023   Renal artery stenosis    right >60% and left on upper end of 1-59% by dopplers 1/24   Sleep apnea    Tumor of thyroid  08/23/2019   Benign  Past Surgical History:  Procedure Laterality Date   APPENDECTOMY  1954   COLONOSCOPY     LAPAROSCOPY  1982   THYROIDECTOMY  2007   TONSILLECTOMY  1949    Family History  Problem Relation Age of Onset   Colon cancer Sister    Breast cancer Maternal Aunt 70   Stomach cancer Neg Hx    Rectal cancer Neg Hx    Esophageal cancer Neg Hx     Social History   Tobacco Use   Smoking status:  Never   Smokeless tobacco: Never  Vaping Use   Vaping status: Never Used  Substance Use Topics   Alcohol use: Not Currently    Alcohol/week: 1.0 standard drink of alcohol    Types: 1 Glasses of wine per week    Comment: occassional   Drug use: No     Allergies  Allergen Reactions   Molds & Smuts Cough    Other reaction(s): cough   Tetracyclines & Related Other (See Comments)    Stomach cramps    Review of Systems NEGATIVE UNLESS OTHERWISE INDICATED IN HPI      Objective:     BP 120/62 (BP Location: Left Arm, Patient Position: Sitting, Cuff Size: Normal)   Pulse 64   Temp 97.9 F (36.6 C) (Temporal)   Ht 5' 4 (1.626 m)   Wt 158 lb 12.8 oz (72 kg)   LMP  (LMP Unknown)   SpO2 98%   BMI 27.26 kg/m   Wt Readings from Last 3 Encounters:  07/26/24 158 lb 12.8 oz (72 kg)  06/28/24 158 lb (71.7 kg)  04/17/24 158 lb (71.7 kg)    BP Readings from Last 3 Encounters:  07/26/24 120/62  04/17/24 (!) 144/53  04/06/24 (!) 126/58     Physical Exam Vitals and nursing note reviewed.  Constitutional:      General: She is not in acute distress.    Appearance: Normal appearance. She is not ill-appearing.  HENT:     Head: Normocephalic.     Right Ear: External ear normal.     Left Ear: External ear normal.  Eyes:     Extraocular Movements: Extraocular movements intact.     Conjunctiva/sclera: Conjunctivae normal.     Pupils: Pupils are equal, round, and reactive to light.  Cardiovascular:     Rate and Rhythm: Normal rate and regular rhythm.     Pulses: Normal pulses.     Heart sounds: Normal heart sounds. No murmur heard. Pulmonary:     Effort: Pulmonary effort is normal. No respiratory distress.     Breath sounds: Normal breath sounds. No wheezing.  Musculoskeletal:     Cervical back: Normal range of motion.  Skin:    General: Skin is warm.  Neurological:     Mental Status: She is alert and oriented to person, place, and time.  Psychiatric:        Mood and  Affect: Mood normal.        Behavior: Behavior normal.             Daquan Crapps M Esra Frankowski, PA-C

## 2024-07-26 NOTE — Patient Instructions (Signed)
 Merry Christmas! Always good to see you

## 2024-07-27 DIAGNOSIS — R351 Nocturia: Secondary | ICD-10-CM | POA: Diagnosis not present

## 2024-07-27 DIAGNOSIS — R350 Frequency of micturition: Secondary | ICD-10-CM | POA: Diagnosis not present

## 2024-07-27 DIAGNOSIS — R1011 Right upper quadrant pain: Secondary | ICD-10-CM | POA: Diagnosis not present

## 2024-07-29 ENCOUNTER — Emergency Department (HOSPITAL_BASED_OUTPATIENT_CLINIC_OR_DEPARTMENT_OTHER)
Admission: EM | Admit: 2024-07-29 | Discharge: 2024-07-29 | Disposition: A | Discharge status: Home / Self Care | Attending: Emergency Medicine | Admitting: Emergency Medicine

## 2024-07-29 ENCOUNTER — Emergency Department (HOSPITAL_BASED_OUTPATIENT_CLINIC_OR_DEPARTMENT_OTHER)

## 2024-07-29 ENCOUNTER — Other Ambulatory Visit: Payer: Self-pay

## 2024-07-29 DIAGNOSIS — E039 Hypothyroidism, unspecified: Secondary | ICD-10-CM | POA: Diagnosis not present

## 2024-07-29 DIAGNOSIS — K838 Other specified diseases of biliary tract: Secondary | ICD-10-CM | POA: Diagnosis not present

## 2024-07-29 DIAGNOSIS — Z79899 Other long term (current) drug therapy: Secondary | ICD-10-CM | POA: Diagnosis not present

## 2024-07-29 DIAGNOSIS — K8050 Calculus of bile duct without cholangitis or cholecystitis without obstruction: Secondary | ICD-10-CM | POA: Insufficient documentation

## 2024-07-29 DIAGNOSIS — R109 Unspecified abdominal pain: Secondary | ICD-10-CM | POA: Diagnosis not present

## 2024-07-29 DIAGNOSIS — R1011 Right upper quadrant pain: Secondary | ICD-10-CM | POA: Diagnosis present

## 2024-07-29 DIAGNOSIS — I10 Essential (primary) hypertension: Secondary | ICD-10-CM | POA: Diagnosis not present

## 2024-07-29 DIAGNOSIS — R1031 Right lower quadrant pain: Secondary | ICD-10-CM | POA: Diagnosis not present

## 2024-07-29 LAB — CBC
HCT: 41.7 % (ref 36.0–46.0)
Hemoglobin: 14.2 g/dL (ref 12.0–15.0)
MCH: 29.5 pg (ref 26.0–34.0)
MCHC: 34.1 g/dL (ref 30.0–36.0)
MCV: 86.7 fL (ref 80.0–100.0)
Platelets: 226 K/uL (ref 150–400)
RBC: 4.81 MIL/uL (ref 3.87–5.11)
RDW: 12.4 % (ref 11.5–15.5)
WBC: 7 K/uL (ref 4.0–10.5)
nRBC: 0 % (ref 0.0–0.2)

## 2024-07-29 LAB — URINALYSIS, ROUTINE W REFLEX MICROSCOPIC
Bacteria, UA: NONE SEEN
Bilirubin Urine: NEGATIVE
Glucose, UA: NEGATIVE mg/dL
Hgb urine dipstick: NEGATIVE
Ketones, ur: NEGATIVE mg/dL
Nitrite: NEGATIVE
Specific Gravity, Urine: 1.023 (ref 1.005–1.030)
pH: 6.5 (ref 5.0–8.0)

## 2024-07-29 LAB — COMPREHENSIVE METABOLIC PANEL WITH GFR
ALT: 16 U/L (ref 0–44)
AST: 22 U/L (ref 15–41)
Albumin: 4.2 g/dL (ref 3.5–5.0)
Alkaline Phosphatase: 106 U/L (ref 38–126)
Anion gap: 10 (ref 5–15)
BUN: 17 mg/dL (ref 8–23)
CO2: 29 mmol/L (ref 22–32)
Calcium: 10.2 mg/dL (ref 8.9–10.3)
Chloride: 103 mmol/L (ref 98–111)
Creatinine, Ser: 0.76 mg/dL (ref 0.44–1.00)
GFR, Estimated: >60 mL/min (ref >60)
Glucose, Bld: 82 mg/dL (ref 70–99)
Potassium: 4.3 mmol/L (ref 3.5–5.1)
Sodium: 142 mmol/L (ref 135–145)
Total Bilirubin: 0.7 mg/dL (ref 0.0–1.2)
Total Protein: 7.3 g/dL (ref 6.5–8.1)

## 2024-07-29 LAB — LIPASE, BLOOD: Lipase: 38 U/L (ref 11–51)

## 2024-07-29 NOTE — ED Notes (Addendum)
 Brought pt. Apple juice and peanut butter crackers

## 2024-07-29 NOTE — ED Notes (Signed)
 DC paperwork given and verbally understood.

## 2024-07-29 NOTE — ED Provider Notes (Signed)
 [place] [facility]PARTMENT AT [facility] Provider Note   CSN: [id number] Arrival date & time: 07/29/24  1339     History Chief Complaint  Patient presents with   Abdominal Pain    HPI: Teresa Rangel is a 83 y.o. female with history pertinent for renal artery stenosis, hypothyroidism, HTN, HLD, prior appendectomy who presents complaining of right upper quadrant pain. Patient arrived via POV accompanied by husband.  History provided by patient.  No interpreter required during this encounter.  Patient reports that over the past week she has had intermittent severe right upper quadrant pain, reports that it is stabbing in nature, and is correlated with eating.  Reports that she had a severe episode this morning after having a muffin for breakfast.  Denies any fever, chills, chest pain, shortness of breath, endorsed previous nausea, resolved spontaneously, no diarrhea.  Reports that she has been pain-free for approximately 3 hours.  Patient's recorded medical, surgical, social, medication list and allergies were reviewed in the Snapshot window as part of the initial history.   Prior to Admission medications   Medication Sig Start Date End Date Taking? Authorizing Provider  albuterol  (VENTOLIN  HFA) 108 (90 Base) MCG/ACT inhaler Inhale 1-2 puffs into the lungs every 6 (six) hours as needed for wheezing or shortness of breath. 01/10/24   Cobb, Comer GAILS, NP  amLODipine  (NORVASC ) 10 MG tablet Take 1 tablet (10 mg total) by mouth daily. 02/08/24   Shlomo Wilbert SAUNDERS, MD  BREO ELLIPTA  200-25 MCG/ACT AEPB INHALE 1 PUFF BY MOUTH DAILY 07/16/24   Cobb, Comer GAILS, NP  carvedilol  (COREG ) 6.25 MG tablet TAKE 1 TABLET BY MOUTH 2 TIMES A DAY 04/19/24   Shlomo Wilbert SAUNDERS, MD  FLUoxetine  (PROZAC ) 40 MG capsule Take 1 capsule (40 mg total) by mouth daily. 07/02/24   Allwardt, Alyssa M, PA-C  fluticasone  (FLONASE ) 50 MCG/ACT nasal spray Place 2 sprays into both nostrils daily. 01/10/24   Cobb,  Comer GAILS, NP  hydrochlorothiazide  (HYDRODIURIL ) 25 MG tablet Take 25 mg by mouth daily.    [provider]  hydrocortisone  (ANUSOL -HC) 2.5 % rectal cream Place 1 Application rectally 3 (three) times daily. 06/12/24   Zehr, Jessica D, PA-C  irbesartan  (AVAPRO ) 75 MG tablet Take 1 tablet (75 mg total) by mouth daily. 03/26/24   Shlomo Wilbert SAUNDERS, MD  levothyroxine  (SYNTHROID ) 88 MCG tablet TAKE 1 TABLET BY MOUTH DAILY BEFORE BREAKFAST 06/01/24   Allwardt, Alyssa M, PA-C  Multiple Vitamins-Minerals (PRESERVISION AREDS 2 PO) Take 1 tablet by mouth daily.    [provider]  pravastatin  (PRAVACHOL ) 80 MG tablet TAKE 1 TABLET BY MOUTH EVERY EVENING 06/07/24   Turner, Traci R, MD  TURMERIC PO Take 1 tablet by mouth daily.    [provider]     Allergies: Molds & smuts and Tetracyclines & related   Review of Systems   ROS as per HPI  Physical Exam Updated Vital Signs BP (!) 156/75 (BP Location: Right Arm)   Pulse 67   Temp 98 F (36.7 C)   Resp 17   LMP  (LMP Unknown)   SpO2 98%  Physical Exam Vitals and nursing note reviewed.  Constitutional:      General: She is not in acute distress.    Appearance: She is well-developed.  HENT:     Head: Normocephalic and atraumatic.  Eyes:     Conjunctiva/sclera: Conjunctivae normal.  Cardiovascular:     Rate and Rhythm: Normal rate and regular rhythm.  Heart sounds: No murmur heard. Pulmonary:     Effort: Pulmonary effort is normal. No respiratory distress.     Breath sounds: Normal breath sounds.  Abdominal:     Palpations: Abdomen is soft.     Tenderness: There is no abdominal tenderness.  Musculoskeletal:        General: No swelling.     Cervical back: Neck supple.  Skin:    General: Skin is warm and dry.     Capillary Refill: Capillary refill takes less than 2 seconds.  Neurological:     Mental Status: She is alert.  Psychiatric:        Mood and Affect: Mood normal.     ED Course/ Medical Decision  Making/ A&P    Procedures Procedures   Medications Ordered in ED Medications - No data to display  Medical Decision Making:   Teresa Rangel is a 83 y.o. female who presents for abdominal pain as per above.  Physical exam is pertinent for no focal abnormalities.   The differential includes but is not limited to biliary colic, cholelithiasis, biliary sludge, cholecystitis, cholangitis, pancreatitis, choledocholithiasis, mesenteric ischemia, gastritis, reflux.  Independent historian: Spouse/partner  External data reviewed: {LSEXTERNALDATA:33407}  Initial Plan:  ***Screening labs including CBC and Metabolic panel to evaluate for infectious or metabolic etiology of disease.  *** ***Urinalysis with reflex culture ordered to evaluate for UTI or relevant urologic/nephrologic pathology.  *** to evaluate for structural/infectious intra-*** pathology.  {crccardiactesting:32591::EKG to evaluate for cardiac pathology} Objective evaluation as below reviewed   Labs: Ordered, Independent interpretation, and Details: CBC without leukocytosis, anemia, thrombocytopenia.  Lipase WNL.  CMP without AKI, emergent lactic gentian, emergent LFT abnormality.  UA without UTI.  Radiology: {LSRADS:33417} No results found.  EKG/Medicine tests: {LSEKG:33414} EKG Interpretation:    Decision rules:  {Document cardiac monitor, telemetry assessment procedure when appropriate:1} {   Click here for ABCD2, HEART and other calculatorsREFRESH Note before signing :1}   {Document critical care time when appropriate:1} {Document review of labs and clinical decision tools ie heart score, Chads2Vasc2 etc:1}  {Document your independent review of radiology images, and any outside records:1} {Document your discussion with family members, caretakers, and with consultants:1} {Document social determinants of health affecting pt's care:1} {Document your decision making why or why not admission, treatments were  needed:1}                Interventions:***   See the EMR for full details regarding lab and imaging results.  Patient presents for intermittent right upper quadrant pain, has no focal tenderness to palpation on exam, reports that her pain resolved spontaneously while waiting for evaluation.  Given patient has postprandial right upper quadrant pain, most concern for biliary pathology, though do consider other pathology such as mesenteric ischemia, reflux/gastritis given patient describes postprandial symptoms, however given localization of pain, feel that upper quadrant ultrasound is most appropriate initial evaluation.  This was obtained and ***  Patient was able to tolerate water as well as peanut butter crackers without abdominal pain, given pain does not occur with eating this food, pain is localized, low index of suspicion for  {LSCOPA:33420}  Discussion of management or test interpretations with external provider(s): ***  Risk Drugs:{LSDRUGS:33399} Treatment: {LSTREATMENT:33409} Surgery:{LSSURGERY:33410} Critical Care: ***  Disposition: {LSDISPO:33388}  MDM generated using voice dictation software and may contain dictation errors.  Please contact me for any clarification or with any questions.  Clinical Impression: No diagnosis found.   Data Unavailable   Final Clinical Impression(s) /  ED Diagnoses Final diagnoses:  None    Rx / DC Orders ED Discharge Orders     None

## 2024-07-29 NOTE — ED Triage Notes (Signed)
 Pt reports RUQ abdominal pain that worsens with eating  x 1 week. Denies N/V

## 2024-07-29 NOTE — Discharge Instructions (Addendum)
 [facility]  Thank you for allowing us  to take care of you today.  You came to the Emergency Department today because you have had intermittent pain in the right upper part of your abdomen, particularly after eating.  Pain in this area particularly after eating is most consistent with having pain in your gallbladder.  If the pain goes away, that means that it is not inflammation/infection of the gallbladder, rather sometimes if you have gallstones or sludge in your gallbladder, when you eat and your gallbladder squeezes, particularly after a fatty meal this can cause pain in your gallbladder.  This is called biliary colic.  We recommend eating a low-fat diet, we also will give you a referral to follow-up with general surgery to talk about whether or not you should have your gallbladder removed.  If you have pain in this area that does not go away on its own, and or if you have new or different abdominal pain, that be reason to come back to the emergency department for further evaluation.   To-Do: 1. Please follow-up with your primary doctor within 1 - 2 weeks / as soon as possible.    Please return to the Emergency Department or call 911 if you experience have worsening of your symptoms, or do not get better, chest pain, shortness of breath, severe or significantly worsening pain, high fever, severe confusion, pass out or have any reason to think that you need emergency medical care.   We hope you feel better soon.   Mitzie Later, MD Department of Emergency Medicine [facility]

## 2024-07-31 ENCOUNTER — Telehealth: Payer: Self-pay | Admitting: Internal Medicine

## 2024-07-31 NOTE — Telephone Encounter (Signed)
 12/18 at 910 am appt made to see Dr Avram per her request for reflux and bloating.  She has an appt Friday with [facility] and will keep that as well.

## 2024-07-31 NOTE — Telephone Encounter (Signed)
 Patient called stating she was at the ED over the weekend due to severe abdominal pain and gallbladder concerns. States she is scheduled with surgeon. Patient is requesting a call to discuss further. Please advise, thank you.

## 2024-08-01 ENCOUNTER — Telehealth: Payer: Self-pay

## 2024-08-01 ENCOUNTER — Encounter: Payer: Self-pay | Admitting: Physician Assistant

## 2024-08-01 ENCOUNTER — Ambulatory Visit: Admitting: Physical Therapy

## 2024-08-01 DIAGNOSIS — R293 Abnormal posture: Secondary | ICD-10-CM

## 2024-08-01 DIAGNOSIS — M6281 Muscle weakness (generalized): Secondary | ICD-10-CM | POA: Diagnosis not present

## 2024-08-01 DIAGNOSIS — R279 Unspecified lack of coordination: Secondary | ICD-10-CM

## 2024-08-01 NOTE — Therapy (Signed)
 OUTPATIENT PHYSICAL THERAPY FEMALE PELVIC TREATMENT   Patient Name: Teresa Rangel MRN: [id number] DOB:[birth year], 83 y.o., female Today's Date: 08/01/2024  END OF SESSION:  PT End of Session - 08/01/24 0911     Visit Number 6    Number of Visits 10    Date for Recertification  07/04/24    Authorization Type HTA    PT Start Time 0845    PT Stop Time 0910    PT Time Calculation (min) 25 min    Activity Tolerance Patient tolerated treatment well    Behavior During Therapy [facility] for tasks assessed/performed              Past Medical History:  Diagnosis Date   Allergy    History of stomach ulcers 1980   Hyperlipidemia    Hypertension    Hypothyroidism    Macular degeneration of left eye    OSA (obstructive sleep apnea)    mild obstructive sleep apnea with an AHI of 10/hr.  Auto CPAP from 4 to 15 cm H2O   Osteoporosis    Pain of right hand 11/30/2023   Renal artery stenosis    right >60% and left on upper end of 1-59% by dopplers 1/24   Sleep apnea    Tumor of thyroid  08/23/2019   Benign    Past Surgical History:  Procedure Laterality Date   APPENDECTOMY  1954   COLONOSCOPY     LAPAROSCOPY  1982   THYROIDECTOMY  2007   TONSILLECTOMY  1949   Patient Active Problem List   Diagnosis Date Noted   Flatulence 04/06/2024   Altered bowel habits 04/06/2024   Hemorrhoids 04/06/2024   Family history of colon cancer 04/06/2024   Cough variant asthma 01/10/2024   Allergic rhinitis 01/10/2024   Degenerative disorder of musculoskeletal system 01/03/2024   Low back pain 01/03/2024   Lumbar radiculopathy 01/03/2024   Spondylolisthesis of lumbar region 01/03/2024   Heberden's nodes (with arthropathy) 11/30/2023   Anxiety and depression 04/12/2023   OSA (obstructive sleep apnea) 10/13/2022   Renal artery stenosis 09/22/2022   Primary insomnia 02/02/2022   Bronchitis 08/18/2021   Macular degeneration disease 06/04/2020   Chronic cystitis 09/20/2019   Gout  05/10/2019   Osteoporosis 05/07/2019   Varicose veins of both lower extremities with pain 12/06/2018   Dyslipidemia 06/07/2013   Essential hypertension 08/24/2012   Hypothyroidism 08/24/2012   Vitamin D  deficiency 08/24/2012    PCP: Allwardt, Mardy HERO, PA-C  REFERRING PROVIDER: Avram Lupita BRAVO, MD   REFERRING DIAG: R19.4 (ICD-10-CM) - Altered bowel habits  THERAPY DIAG:  Muscle weakness (generalized)  Unspecified lack of coordination  Abnormal posture  Rationale for Evaluation and Treatment: Rehabilitation  ONSET DATE: after childbirth   SUBJECTIVE:  SUBJECTIVE STATEMENT: Patient reports that last Wednesday she started having stomach pains in the right upper quadrant that was grabbing in nature. This pain got worse until Sunday, she went to the ER and learned that it was her gallbladder. She has an appointment this Friday with a gallbladder specialist. Bowel movements have been better which she attributes to her exercises from PT. She has not eaten much other than grits, cornbread muffins (gluten free), and cabbage since Sunday morning. Gallbladder is feeling okay today. She hasn't had much pain with bowel movements recently. She saw her urologist Friday and she does not have any kidney stones. She will learn Friday if she has to have her gallbladder removed or not. Hemorrhoids have been managed. She sees a urogynecologist appointment scheduled next week.   Eval: Patient reports to PFPT with bad hemorrhoids that she has had since the birth of her children, and they have gotten worse. She has difficulty having bowel movements and she has to return to the bathroom multiple times before she feels empty. She had a colonoscopy 10 days ago and it came back all clear other than some polyps that were benign that  were removed. Dr. Avram thinks that she is experiencing prolapse. She is now taking 2 teaspoons of benefiber and it doesn't cause any bloating/gas for her. She is currently following the FODMAP diet for her digestion and this makes a difference with her bowel habits/gas.  Fluid intake: drinks 3-4 bottles of water daily (16.9 fl oz), she adds a liquid IV to her water; coffee in the morning (1-1.5 cups), collagen in coffee, occasionally a dr pepper   PAIN:  Are you having pain? No NPRS scale: 0/10  PRECAUTIONS: None  RED FLAGS: None   WEIGHT BEARING RESTRICTIONS: No  FALLS:  Has patient fallen in last 6 months? Yes. 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   ACTIVITY LEVEL : low level of physical activity - tries to walk daily   PLOF: Independent with basic ADLs  PATIENT GOALS: better health, more control of gas/feces,    BOWEL MOVEMENT: Pain with bowel movement: No Type of bowel movement:Type (Bristol Stool Scale) 4, Frequency daily, Strain no, and Splinting no Fully empty rectum: No - could return 2 or 3 times after initial bowel movement  Leakage: Yes: if she doesn't get there in time  Pads: Yes: wears one just in case she has fecal incontinence  Fiber supplement/laxative Yes - 2 tablespoons of benefiber that is working well for her   URINATION: Pain with urination: No Fully empty bladder: Yes:   Stream: Weak Urgency: Yes  Frequency: within normal limits  Leakage: none Pads: Yes: fear of fecal incontinence  INTERCOURSE: not currently sexually active   Ability to have vaginal penetration Yes  Pain with intercourse: none DrynessYes   PREGNANCY: Vaginal deliveries 2  PROLAPSE: None - but her urologist says she has some bladder prolapse   OBJECTIVE:  Note: Objective measures were completed at Evaluation unless otherwise noted.  PATIENT SURVEYS:   PFIQ-7: 45  COGNITION: Overall cognitive status: Within functional limits for tasks assessed     SENSATION: Light touch: Appears intact  LUMBAR SPECIAL TESTS:  Single leg stance test: Positive  FUNCTIONAL TESTS:  Squat: bilateral dynamic knee valgus with loading, general lumbopelvic stiffness with transfers   GAIT: Assistive device utilized: None Comments: moderate trendelenburg gait pattern with ambulation  POSTURE: rounded shoulders, forward head, and flexed trunk   LUMBARAROM/PROM: within normal limits for all motions bilaterally with no pain   LOWER EXTREMITY ROM: within normal limits for all motions bilaterally with no pain   LOWER EXTREMITY MMT: 4/5  bilateral knees and hips grossly   PALPATION:   General: no tenderness to palpation of bilateral adductors or hip flexors   Pelvic Alignment: within normal limits   Abdominal: upper chest breathing, abdominal bracing at rest, decreased lower rib excursion with inhalation                 External Perineal Exam: patient has significant external hemorrhoids present at external anal sphincter, dryness present                              Internal Pelvic Floor: Patient fully consents to today's internal rectal examination. She demonstrates low muscle tone at external and internal anal sphincter. She has a weak pelvic floor contraction and tends to bear down when contracting rather than lifting the muscles. She had no pain or palpable trigger points with today's exam, no stool on glove following exam.   Patient confirms identification and approves PT to assess internal pelvic floor and treatment Yes No emotional/communication barriers or cognitive limitation. Patient is motivated to learn. Patient understands and agrees with treatment goals and plan. PT explains patient will be examined in standing, sitting, and lying down to see how their muscles and joints work. When they are ready, they will be asked to remove their underwear  so PT can examine their perineum. The patient is also given the option of providing their own chaperone as one is not provided in our facility. The patient also has the right and is explained the right to defer or refuse any part of the evaluation or treatment including the internal exam. With the patient's consent, PT will use one gloved finger to gently assess the muscles of the pelvic floor, seeing how well it contracts and relaxes and if there is muscle symmetry. After, the patient will get dressed and PT and patient will discuss exam findings and plan of care. PT and patient discuss plan of care, schedule, attendance policy and HEP activities.   PELVIC MMT:   MMT eval  Vaginal 3/5 with weakness and lack of coordination   Internal Anal Sphincter 2/5  External Anal Sphincter 2/5  Puborectalis   Diastasis Recti   (Blank rows = not tested)  TONE: Low at internal and external anal sphincter  PROLAPSE: Not assessed in sidelying position today   TODAY'S TREATMENT:                                                                                                                              DATE:   07/11/24: Seated pelvic floor contraction + diaphragmatic breathing x10  Standing pelvic floor contraction + diaphragmatic breathing x10  Seated quick  flick contractions + diaphragmatic breathing x20  Sit to stand + adductor ball squeeze + diaphragmatic breathing (kegel at top of motion) x10  Bridge + adductor ball squeeze + diaphragmatic breathing (kegel at top of motion) 2x10  Seated clamshells (BlueTB) + diaphragmatic breathing 2x15 Seated internal rotation (no additional resistance) + hip adduction against ball + diaphragmatic breathing 2x10   07/18/24: Seated pelvic floor contraction + diaphragmatic breathing x49min  Seated quick flick pelvic floor contractions + diaphragmatic breathing 2x20 -cueing to not engage gluteals with these  Sit to stand + adductor ball squeeze + diaphragmatic  breathing (kegel at top of motion) x5 Mini bodyweight squat + diaphragmatic breathing (glute squeeze at top) 2x5  Seated clamshells + GTB + diaphragmatic breathing 2x12  Seated internal rotation against ball + diaphragmatic breathing 2x10   Bridge + hip abduction (GTB) + diaphragmatic breathing 2x10  Lower trunk rotations + diaphragmatic breathing 2x10   08/01/24: Review of current HEP and discussion of pain management with gallbladder right now   Pt requested to end session early due to pain in right upper quadrant (gallbladder) - we verbally reviewed her HEP and discussed the future of her visits as she might have to have a gallbladder removal procedure performed   PATIENT EDUCATION:  Education details: relative anatomy and the connection between the diaphragm and pelvic floor, how water intake and fiber intake affect stool consistency and emptying  Person educated: Patient Education method: Explanation, Demonstration, Tactile cues, Verbal cues, and Handouts Education comprehension: verbalized understanding, returned demonstration, verbal cues required, tactile cues required, and needs further education  HOME EXERCISE PROGRAM: Access Code: [email or web address] URL: [email or web address] Date: 08/01/2024 Prepared by: Celena Domino  Exercises - Seated Pelvic Floor Contraction  - 1 x daily - 7 x weekly - 1 sets - 10 reps - Standing Pelvic Floor Contraction  - 1 x daily - 7 x weekly - 1 sets - 10 reps - Seated Quick Flick Pelvic Floor Contractions  - 1 x daily - 7 x weekly - 2 sets - 15 reps - Pelvic Floor Muscle Contraction and Pelvic Tilt With Hips Elevated (for Pelvic Organ Prolapse)  - 1 x daily - 7 x weekly - 2 sets - 10 reps - Mini Squat  - 1 x daily - 7 x weekly - 2 sets - 10 reps - Bridge with Hip Abduction and Resistance  - 1 x daily - 7 x weekly - 2 sets - 10 reps - Seated Hip Internal Rotation with Ball and Resistance  - 1 x daily - 7 x weekly - 2 sets - 16 reps - Seated Hip  Abduction with Resistance  - 1 x daily - 7 x weekly - 2 sets - 15 reps - Supine Lower Trunk Rotation  - 1 x daily - 7 x weekly - 2 sets - 10 reps - Clamshell  - 1 x daily - 7 x weekly - 2 sets - 10 reps - Sidelying Reverse Clamshell  - 1 x daily - 7 x weekly - 2 sets - 10 reps - Supine Figure 4 Piriformis Stretch  - 1 x daily - 7 x weekly - 2 sets - hold  Patient Education - Bowel Emptying Techniques - Bowel Emptying Techniques  ASSESSMENT:  CLINICAL IMPRESSION: Patient is a 83 y.o. female  who was seen today for physical therapy treatment for bowel dysfunction including incontinence and difficulty emptying bowels. Her bowel urgency and control has been  much better overall and she attributes this to  her HEP. She was admitted to ED on Sunday due to gallbladder pain, is seeing a specialist on Friday to discuss potential removal of gallbladder. Pt started experiencing pain during today's session, so we ended session early as she did not wish to exercise today. We verbally reviewed current HEP and addition of sidelying and supine stretching to perform in bed. Overall, patient tolerated session well and Pt would benefit from additional PT to further address deficits.    OBJECTIVE IMPAIRMENTS: decreased coordination, decreased endurance, decreased mobility, decreased ROM, and decreased strength.   ACTIVITY LIMITATIONS: continence  PARTICIPATION LIMITATIONS: community activity  PERSONAL FACTORS: Age, Past/current experiences, and Time since onset of injury/illness/exacerbation are also affecting patient's functional outcome.   REHAB POTENTIAL: Good  CLINICAL DECISION MAKING: Stable/uncomplicated  EVALUATION COMPLEXITY: Low   GOALS: Goals reviewed with patient? Yes  SHORT TERM GOALS: Target date: 05/23/2024  Pt will be independent with HEP.  Baseline: Goal status: INITIAL  2.  Pt will be independent with use of squatty potty, relaxed toileting mechanics, and improved bowel  movement techniques in order to increase ease of bowel movements and complete evacuation.  Baseline:  Goal status: INITIAL  3.  Pt will be independent with the knack, urge suppression technique, and double voiding in order to improve bladder habits and decrease urinary incontinence.  Baseline:  Goal status: INITIAL  4.  Pt will report her BMs are complete due to improved bowel habits and evacuation techniques.  Baseline:  Goal status: INITIAL  LONG TERM GOALS: Target date: 10/26/2024  Pt will be independent with advanced HEP.  Baseline:  Goal status: INITIAL  2.  Pt to demonstrate improved coordination of pelvic floor and breathing mechanics with 10# squat with appropriate synergistic patterns to decrease pain and leakage at least 75% of the time for improved ability to complete a 30 minute workout with strain at pelvic floor and symptoms.   Baseline:  Goal status: INITIAL  3.  Pt will report 75% less abdominal bloating and discomfort due to improved muscle tone throughout the core and pelvic floor to improve quality of life and flatulence.  Baseline:  Goal status: INITIAL  4.  Pt will demonstrate 3/5 pelvic floor muscle strength with appropriate coordination in order to decrease urinary incontinence, urgency and frequency.   Baseline:  Goal status: INITIAL  PLAN:  PT FREQUENCY: 1-2x/week  PT DURATION: 12 weeks  PLANNED INTERVENTIONS: 97110-Therapeutic exercises, 97530- Therapeutic activity, 97112- Neuromuscular re-education, 97535- Self Care, [id number]- Manual therapy, Patient/Family education, Taping, Joint mobilization, Spinal mobilization, Scar mobilization, Cryotherapy, and Moist heat  PLAN FOR NEXT SESSION: continued pelvic floor AROM training in seated, introduce gluteal strengthening, discuss toileting and emptying techniques, knack technique   Celena JAYSON Domino, PT 08/01/2024, 9:12 AM

## 2024-08-01 NOTE — Telephone Encounter (Signed)
 Transition Care Management Follow-up Telephone Call Date of discharge and from where: 07/29/24 [facility] How have you been since you were released from the hospital? [facility] Any questions or concerns? No  Items Reviewed: Did the pt receive and understand the discharge instructions provided? Yes  Medications obtained and verified? Yes  Other? No  Any new allergies since your discharge? No  Dietary orders reviewed? No Do you have support at home? Yes   Home Care and Equipment/Supplies: Were home health services ordered? not applicable If so, what is the name of the agency?   Has the agency set up a time to come to the patient's home? not applicable Were any new equipment or medical supplies ordered?  No What is the name of the medical supply agency?  Were you able to get the supplies/equipment? not applicable Do you have any questions related to the use of the equipment or supplies? No  Functional Questionnaire: (I = Independent and D = Dependent) ADLs: I  Bathing/Dressing- I  Meal Prep- I  Eating- I  Maintaining continence- I  Transferring/Ambulation- I  Managing Meds- I  Follow up appointments reviewed:  PCP Hospital f/u appt confirmed? N/A PCP spoke to pt via [facility] f/u appt confirmed? Yes  Scheduled to see Dr Avram on 08/30/24. Are transportation arrangements needed? No  If their condition worsens, is the pt aware to call PCP or go to the Emergency Dept.? Yes Was the patient provided with contact information for the PCP's office or ED? Yes Was to pt encouraged to call back with questions or concerns? Yes

## 2024-08-02 ENCOUNTER — Ambulatory Visit: Payer: Self-pay | Admitting: General Surgery

## 2024-08-02 ENCOUNTER — Telehealth: Payer: Self-pay

## 2024-08-02 ENCOUNTER — Other Ambulatory Visit: Payer: Self-pay | Admitting: Cardiology

## 2024-08-02 DIAGNOSIS — K8020 Calculus of gallbladder without cholecystitis without obstruction: Secondary | ICD-10-CM | POA: Diagnosis not present

## 2024-08-02 NOTE — Telephone Encounter (Signed)
 Called patient, NA, left message to contact our office to schedule telehealth for cardiac clearance.

## 2024-08-02 NOTE — Telephone Encounter (Signed)
   Name: Teresa Rangel  DOB: Nov 13, 1940  MRN: [id number]  Primary Cardiologist: Wilbert Bihari, MD  Chart reviewed as part of pre-operative protocol coverage. Because of Teresa Rangel's past medical history and time since last visit, she will require a follow-up telephone visit in order to better assess preoperative cardiovascular risk.  Pre-op covering staff: - Please schedule appointment and call patient to inform them. If patient already had an upcoming appointment within acceptable timeframe, please add pre-op clearance to the appointment notes so provider is aware. - Please contact requesting surgeon's office via preferred method (i.e, phone, fax) to inform them of need for appointment prior to surgery.  No medications indicated as needing held.   [name], PA-C  08/02/2024, 3:44 PM

## 2024-08-02 NOTE — Telephone Encounter (Signed)
   Pre-operative Risk Assessment    Patient Name: Teresa Rangel  DOB: [birth year] MRN: [id number]   Date of last office visit: 01/20/2024, Dr. Wilbert Bihari, MD Date of next office visit: NONE   Request for Surgical Clearance    Procedure:  Laparoscopic Cholecystectomy   Date of Surgery:  Clearance TBD                                Surgeon: Dr. Cordella Idler, MD Surgeon's Group or Practice Name: [facility] Phone number: [phone]  Fax number: [phone]   Type of Clearance Requested:   - Medical    Type of Anesthesia:  General    Additional requests/questions:    SignedAsberry KANDICE Dunning   08/02/2024, 2:20 PM

## 2024-08-02 NOTE — Telephone Encounter (Signed)
 Left patient a detailed message to call for pre-op clearance. 1st attempt.

## 2024-08-03 NOTE — Telephone Encounter (Signed)
 Pt requesting a c/b to schedule her tele visit

## 2024-08-06 ENCOUNTER — Telehealth (HOSPITAL_BASED_OUTPATIENT_CLINIC_OR_DEPARTMENT_OTHER): Payer: Self-pay | Admitting: *Deleted

## 2024-08-06 NOTE — Telephone Encounter (Signed)
 Pt returning call

## 2024-08-06 NOTE — Telephone Encounter (Signed)
 Pt has been scheduled tele preop appt 08/15/24. Med rec and consent are done.      Patient Consent for Virtual Visit        Teresa Rangel has provided verbal consent on 08/06/2024 for a virtual visit (video or telephone).   CONSENT FOR VIRTUAL VISIT FOR:  Teresa Rangel  By participating in this virtual visit I agree to the following:  I hereby voluntarily request, consent and authorize [facility] and its employed or contracted physicians, physician assistants, nurse practitioners or other licensed health care professionals (the Practitioner), to provide me with telemedicine health care services (the "Services) as deemed necessary by the treating Practitioner. I acknowledge and consent to receive the Services by the Practitioner via telemedicine. I understand that the telemedicine visit will involve communicating with the Practitioner through live audiovisual communication technology and the disclosure of certain medical information by electronic transmission. I acknowledge that I have been given the opportunity to request an in-person assessment or other available alternative prior to the telemedicine visit and am voluntarily participating in the telemedicine visit.  I understand that I have the right to withhold or withdraw my consent to the use of telemedicine in the course of my care at any time, without affecting my right to future care or treatment, and that the Practitioner or I may terminate the telemedicine visit at any time. I understand that I have the right to inspect all information obtained and/or recorded in the course of the telemedicine visit and may receive copies of available information for a reasonable fee.  I understand that some of the potential risks of receiving the Services via telemedicine include:  Delay or interruption in medical evaluation due to technological equipment failure or disruption; Information transmitted may not be sufficient (e.g.  poor resolution of images) to allow for appropriate medical decision making by the Practitioner; and/or  In rare instances, security protocols could fail, causing a breach of personal health information.  Furthermore, I acknowledge that it is my responsibility to provide information about my medical history, conditions and care that is complete and accurate to the best of my ability. I acknowledge that Practitioner's advice, recommendations, and/or decision may be based on factors not within their control, such as incomplete or inaccurate data provided by me or distortions of diagnostic images or specimens that may result from electronic transmissions. I understand that the practice of medicine is not an exact science and that Practitioner makes no warranties or guarantees regarding treatment outcomes. I acknowledge that a copy of this consent can be made available to me via my patient portal [facility] MyChart), or I can request a printed copy by calling the office of [facility].    I understand that my insurance will be billed for this visit.   I have read or had this consent read to me. I understand the contents of this consent, which adequately explains the benefits and risks of the Services being provided via telemedicine.  I have been provided ample opportunity to ask questions regarding this consent and the Services and have had my questions answered to my satisfaction. I give my informed consent for the services to be provided through the use of telemedicine in my medical care

## 2024-08-06 NOTE — Telephone Encounter (Signed)
 Pt has been scheduled tele preop appt 10/06/23. Med rec and consent are done.

## 2024-08-06 NOTE — Telephone Encounter (Addendum)
 2nd attempt Left message to call back and schedule a tele preop appt.

## 2024-08-07 ENCOUNTER — Encounter: Payer: Self-pay | Admitting: Obstetrics and Gynecology

## 2024-08-07 ENCOUNTER — Ambulatory Visit: Admitting: Obstetrics and Gynecology

## 2024-08-07 VITALS — BP 126/79 | HR 65 | Ht 64.0 in | Wt 158.0 lb

## 2024-08-07 DIAGNOSIS — R351 Nocturia: Secondary | ICD-10-CM

## 2024-08-07 DIAGNOSIS — N393 Stress incontinence (female) (male): Secondary | ICD-10-CM

## 2024-08-07 DIAGNOSIS — N816 Rectocele: Secondary | ICD-10-CM

## 2024-08-07 DIAGNOSIS — N952 Postmenopausal atrophic vaginitis: Secondary | ICD-10-CM

## 2024-08-07 LAB — POC URINALSYSI DIPSTICK (AUTOMATED)
Bilirubin, UA: NEGATIVE
Blood, UA: NEGATIVE
Glucose, UA: NEGATIVE
Ketones, UA: NEGATIVE
Leukocytes, UA: NEGATIVE
Nitrite, UA: NEGATIVE
Protein, UA: POSITIVE — AB
Spec Grav, UA: 1.02 (ref 1.010–1.025)
Urobilinogen, UA: 1 U/dL
pH, UA: 7 (ref 5.0–8.0)

## 2024-08-07 MED ORDER — ESTRADIOL 0.01 % VA CREA: 0.5000 g | TOPICAL_CREAM | VAGINAL | 11 refills | Status: DC

## 2024-08-07 NOTE — Progress Notes (Signed)
 [facility] New Patient Evaluation and Consultation  Referring Provider: Avram Lupita BRAVO, MD PCP: Kathrene Mardy HERO, PA-C Date of Service: 08/07/2024  SUBJECTIVE Chief Complaint: Establish Care (Cystocele/Rectocele)  History of Present Illness: Teresa Rangel is a 83 y.o. [place] or Caucasian female seen in consultation at the request of Dr. Avram for evaluation of cystocele and rectocele.    Review of records significant for: Planning for Gallbladder surgery in January  Urinary Symptoms: Leaks urine with with a full bladder and with movement to the bathroom Leaks inconsistently Pad use: 2 pads per day.   Patient is not bothered by UI symptoms.  Day time voids 3.  Nocturia: 3 times per night to void. Voiding dysfunction:  empties bladder well.  Patient does not use a catheter to empty bladder.  When urinating, patient feels to push on her belly or vagina to empty bladder Drinks: 1 cup coffee AM, and 40oz water per day  UTIs: 1 UTI's in the last year.   Denies history of urologic concerns.  No results found for the last 90 days.   Pelvic Organ Prolapse Symptoms:                  Patient Denies a feeling of a bulge the vaginal area.   Bowel Symptom: Bowel movements: 1 time(s) per day Stool consistency: loose Straining: yes.  Splinting: no.  Incomplete evacuation: yes.  Patient Admits to accidental bowel leakage / fecal incontinence  Occurs: 1-2 times per week  Consistency with leakage: soft  Bowel regimen: diet and fiber Last colonoscopy: Date 2025, Results 3 adenomas, 2 hyperplastic polyps  HM Colonoscopy   This patient has no relevant Health Maintenance data.     Sexual Function Sexually active: no.  Sexual orientation: Straight Pain with sex: No  Pelvic Pain Denies pelvic pain   Past Medical History:  Past Medical History:  Diagnosis Date   Allergy    History of stomach ulcers 1980   Hyperlipidemia    Hypertension     Hypothyroidism    Macular degeneration of left eye    OSA (obstructive sleep apnea)    mild obstructive sleep apnea with an AHI of 10/hr.  Auto CPAP from 4 to 15 cm H2O   Osteoporosis    Pain of right hand 11/30/2023   Renal artery stenosis    right >60% and left on upper end of 1-59% by dopplers 1/24   Sleep apnea    Tumor of thyroid  08/23/2019   Benign      Past Surgical History:   Past Surgical History:  Procedure Laterality Date   APPENDECTOMY  1954   COLONOSCOPY     LAPAROSCOPY  1982   THYROIDECTOMY  2007   TONSILLECTOMY  1949     Past OB/GYN History: [id number] Vaginal deliveries: 2,  Forceps/ Vacuum deliveries: 0, Cesarean section: 0 Menopausal: Yes, at age 23 Contraception: none. Last pap smear was 2000.  Any history of abnormal pap smears: no. HM PAP   This patient has no relevant Health Maintenance data.     Medications: Patient has a current medication list which includes the following prescription(s): albuterol , amlodipine , breo ellipta , carvedilol , [START ON 08/09/2024] estradiol , fluoxetine , fluticasone , hydrochlorothiazide , hydrocortisone , irbesartan , levothyroxine , multiple vitamins-minerals, pravastatin , and turmeric.   Allergies: Patient is allergic to molds & smuts and tetracyclines & related.   Social History:  Social History   Tobacco Use   Smoking status: Never   Smokeless tobacco: Never  Vaping Use  Vaping status: Never Used  Substance Use Topics   Alcohol use: Not Currently    Alcohol/week: 1.0 standard drink of alcohol    Types: 1 Glasses of wine per week    Comment: occassional   Drug use: No    Relationship status: married Patient lives with her husband.   Patient is employed as a parttime tacher. Regular exercise: No History of abuse: No  Family History:   Family History  Problem Relation Age of Onset   Colon cancer Sister    Breast cancer Maternal Aunt 50   Stomach cancer Neg Hx    Rectal cancer Neg Hx    Esophageal  cancer Neg Hx      Review of Systems: Review of Systems  Constitutional:  Negative for chills and fever.       +Weight Gain  Respiratory:  Negative for cough and shortness of breath.   Cardiovascular:  Negative for chest pain and palpitations.  Gastrointestinal:  Negative for abdominal pain, blood in stool, constipation and diarrhea.  Skin:  Negative for rash.  Neurological:  Positive for dizziness. Negative for weakness.  Endo/Heme/Allergies:  Bruises/bleeds easily.  Psychiatric/Behavioral:  Negative for depression and suicidal ideas.      OBJECTIVE Physical Exam: Vitals:   08/07/24 0958  BP: 126/79  Pulse: 65  Weight: 158 lb (71.7 kg)  Height: 5' 4 (1.626 m)    Physical Exam Vitals reviewed. Exam conducted with a chaperone present.  Constitutional:      Appearance: Normal appearance.  Pulmonary:     Effort: Pulmonary effort is normal.  Abdominal:     Palpations: Abdomen is soft.  Neurological:     General: No focal deficit present.     Mental Status: She is alert and oriented to person, place, and time.  Psychiatric:        Mood and Affect: Mood normal.        Behavior: Behavior normal. Behavior is cooperative.        Thought Content: Thought content normal.      GU / Detailed Urogynecologic Evaluation:  Pelvic Exam: Normal external female genitalia; Bartholin's and Skene's glands normal in appearance; urethral meatus normal in appearance, no urethral masses or discharge.   CST: positive  Speculum exam reveals normal vaginal mucosa with atrophy. Cervix normal appearance. Uterus normal single, nontender. Adnexa normal adnexa.    With apex supported, anterior compartment defect was present  Pelvic floor strength I/V  Pelvic floor musculature: Right levator non-tender, Right obturator non-tender, Left levator non-tender, Left obturator non-tender  POP-Q:   POP-Q  -1.5                                            Aa   -1.5                                            Ba  -6                                              C   4  Gh  5                                            Pb  10                                            tvl   0                                            Ap  0                                            Bp  -6.5                                              D      Rectal Exam:  Normal sphincter tone, large distal rectocele, enterocoele not present, no rectal masses, noted dyssynergia when asking the patient to bear down.     Laboratory Results: Lab Results  Component Value Date   COLORU Amber 05/28/2019   CLARITYU Cloudy 05/28/2019   GLUCOSEUR Negative 05/28/2019   BILIRUBINUR NEGATIVE 07/29/2024   KETONESU Positive 05/28/2019   SPECGRAV >=1.030 (A) 05/28/2019   RBCUR Positive 05/28/2019   PHUR 6.0 05/28/2019   PROTEINUR TRACE (A) 07/29/2024   UROBILINOGEN 0.2 05/28/2019   LEUKOCYTESUR TRACE (A) 07/29/2024    Lab Results  Component Value Date   CREATININE 0.76 07/29/2024   CREATININE 0.68 04/12/2024   CREATININE 0.71 03/23/2024    No results found for: HGBA1C  Lab Results  Component Value Date   HGB 14.2 07/29/2024     ASSESSMENT AND PLAN Ms. Lasater is a 83 y.o. with:  1. Vaginal atrophy   2. Posterior vaginal wall prolapse   3. SUI (stress urinary incontinence, female)   4. Nocturia    Patient has vaginal atrophy on exam. She would benefit from estrogen cream. Patient to use a blueberry sized amount into the vagina. She may use this nightly for 2 weeks and then twice weekly after. We discussed using her finger instead of using the applicator.  Patient has stage II/IV posterior vaginal wall prolapse. We discussed options for management including expectant management, pessary fitting, continuation of pelvic floor PT, and surgery. Patient is not very symptomatic of prolapse, except the feeling that she is not always emptying her rectum. We  discussed using a squatty potty. She reports she will continue with pelvic floor PT and deal with getting her Gallbladder removed, and then may consider a pessary if needed.  Patient has positive CST on exam but is not bothered by intermittent leakage. If this becomes more bothersome could discuss pessary or bulking.  Patient has OSA and is taking her hydrochlorothiazide  at night. We discussed that taking this medication in the evening is most likely a contributing factor to her nighttime frequency. Patient plans to switch the medication to daytime to  decrease her nighttime urination and see if this is helpful.   Patient to follow up in 2-3 months after her gallbladder surgery so we can reassess her symptoms and see if she wishes to pursue any sort of prolapse support.     Monzerrath Mcburney G Kasumi Ditullio, NP

## 2024-08-07 NOTE — Patient Instructions (Addendum)
 Take your hydrochlorothiazide  at lunchtime. This has a diuretic in it and is likely causing you to go more frequently at nighttime.   Stop drinking about 2 hours before bedtime.   You have stage 2 out of 4 Vaginal prolapse on the back wall of the vagina. This is called a rectocele.   We can consider a pessary if you would like to. I have included information on this.

## 2024-08-13 DIAGNOSIS — H353221 Exudative age-related macular degeneration, left eye, with active choroidal neovascularization: Secondary | ICD-10-CM | POA: Diagnosis not present

## 2024-08-15 ENCOUNTER — Ambulatory Visit: Attending: Cardiology | Admitting: Nurse Practitioner

## 2024-08-15 DIAGNOSIS — Z01810 Encounter for preprocedural cardiovascular examination: Secondary | ICD-10-CM | POA: Diagnosis not present

## 2024-08-15 NOTE — Progress Notes (Signed)
 Virtual Visit via Telephone Note   Because of Teresa Rangel co-morbid illnesses, she is at least at moderate risk for complications without adequate follow up.  This format is felt to be most appropriate for this patient at this time.  Due to technical limitations with video connection (technology), today's appointment will be conducted as an audio only telehealth visit, and Teresa Rangel verbally agreed to proceed in this manner.   All issues noted in this document were discussed and addressed.  No physical exam could be performed with this format.  Evaluation Performed:  Preoperative cardiovascular risk assessment _____________   Date:  08/15/2024   Patient ID:  Teresa, Rangel July 20, 1941, MRN [id number] Patient Location:  Home Provider location:   Office  Primary Care Provider:  Allwardt, Mardy HERO, PA-C Primary Cardiologist:  Wilbert Bihari, MD  Chief Complaint / Patient Profile   83 y.o. y/o female with a h/o hypertension, hyperlipidemia, renal artery stenosis, hypothyroidism, and OSA who is pending Laparoscopic Cholecystectomy with Dr. Cordella Idler of [facility] and presents today for telephonic preoperative cardiovascular risk assessment.  History of Present Illness    Teresa Rangel is a 83 y.o. female who presents via audio/video conferencing for a telehealth visit today.  Pt was last seen in cardiology clinic on 01/20/2024 by Dr. Bihari.  At that time Teresa Rangel was doing well. The patient is now pending procedure as outlined above. Since her last visit, she has done well from a cardiac standpoint.   She denies chest pain, palpitations, dyspnea, pnd, orthopnea, n, v, dizziness, syncope, edema, weight gain, or early satiety. All other systems reviewed and are otherwise negative except as noted above.   Past Medical History    Past Medical History:  Diagnosis Date   Allergy    History of stomach ulcers 1980    Hyperlipidemia    Hypertension    Hypothyroidism    Macular degeneration of left eye    OSA (obstructive sleep apnea)    mild obstructive sleep apnea with an AHI of 10/hr.  Auto CPAP from 4 to 15 cm H2O   Osteoporosis    Pain of right hand 11/30/2023   Renal artery stenosis    right >60% and left on upper end of 1-59% by dopplers 1/24   Sleep apnea    Tumor of thyroid  08/23/2019   Benign    Past Surgical History:  Procedure Laterality Date   APPENDECTOMY  1954   COLONOSCOPY     LAPAROSCOPY  1982   THYROIDECTOMY  2007   TONSILLECTOMY  1949    Allergies  Allergies  Allergen Reactions   Molds & Smuts Cough    Other reaction(s): cough   Tetracyclines & Related Other (See Comments)    Stomach cramps    Home Medications    Prior to Admission medications   Medication Sig Start Date End Date Taking? Authorizing Provider  albuterol  (VENTOLIN  HFA) 108 (90 Base) MCG/ACT inhaler Inhale 1-2 puffs into the lungs every 6 (six) hours as needed for wheezing or shortness of breath. 01/10/24   Cobb, Comer GAILS, NP  amLODipine  (NORVASC ) 10 MG tablet TAKE 1 TABLET BY MOUTH DAILY 08/03/24   Bihari Wilbert SAUNDERS, MD  BREO ELLIPTA  200-25 MCG/ACT AEPB INHALE 1 PUFF BY MOUTH DAILY 07/16/24   Cobb, Katherine V, NP  carvedilol  (COREG ) 6.25 MG tablet TAKE 1 TABLET BY MOUTH 2 TIMES A DAY 04/19/24   Bihari Wilbert SAUNDERS, MD  estradiol  (ESTRACE )  0.01 % CREA vaginal cream Place 0.5 g vaginally 2 (two) times a week. Place 0.5g nightly for two weeks then twice a week after 08/09/24   Zuleta, Kaitlin G, NP  FLUoxetine  (PROZAC ) 40 MG capsule Take 1 capsule (40 mg total) by mouth daily. 07/02/24   Allwardt, Alyssa M, PA-C  fluticasone  (FLONASE ) 50 MCG/ACT nasal spray Place 2 sprays into both nostrils daily. 01/10/24   Cobb, Comer GAILS, NP  hydrochlorothiazide  (HYDRODIURIL ) 25 MG tablet Take 25 mg by mouth daily.    [provider]  hydrocortisone  (ANUSOL -HC) 2.5 % rectal cream Place 1 Application rectally 3  (three) times daily. 06/12/24   Zehr, Jessica D, PA-C  irbesartan  (AVAPRO ) 75 MG tablet Take 1 tablet (75 mg total) by mouth daily. 03/26/24   Shlomo Wilbert SAUNDERS, MD  levothyroxine  (SYNTHROID ) 88 MCG tablet TAKE 1 TABLET BY MOUTH DAILY BEFORE BREAKFAST 06/01/24   Allwardt, Alyssa M, PA-C  Multiple Vitamins-Minerals (PRESERVISION AREDS 2 PO) Take 1 tablet by mouth daily.    [provider]  pravastatin  (PRAVACHOL ) 80 MG tablet TAKE 1 TABLET BY MOUTH EVERY EVENING 06/07/24   Turner, Traci R, MD  TURMERIC PO Take 1 tablet by mouth daily.    [provider]    Physical Exam    Vital Signs:  Teresa Rangel does not have vital signs available for review today.  Given telephonic nature of communication, physical exam is limited. AAOx3. NAD. Normal affect.  Speech and respirations are unlabored.  Accessory Clinical Findings    None  Assessment & Plan    1.  Preoperative Cardiovascular Risk Assessment:  According to the Revised Cardiac Risk Index (RCRI), her Perioperative Risk of Major Cardiac Event is (%): 0.4. Her Functional Capacity in METs is: 5.04 according to the Duke Activity Status Index (DASI).Therefore, based on ACC/AHA guidelines, patient would be at acceptable risk for the planned procedure without further cardiovascular testing.   The patient was advised that if she develops new symptoms prior to surgery to contact our office to arrange for a follow-up visit, and she verbalized understanding.  A copy of this note will be routed to requesting surgeon.  Time:   Today, I have spent 5 minutes with the patient with telehealth technology discussing medical history, symptoms, and management plan.     Damien JAYSON Braver, NP  08/15/2024, 10:53 AM

## 2024-08-18 ENCOUNTER — Other Ambulatory Visit: Payer: Self-pay | Admitting: Physician Assistant

## 2024-08-27 ENCOUNTER — Encounter: Payer: Self-pay | Admitting: Cardiology

## 2024-08-27 ENCOUNTER — Encounter: Payer: Self-pay | Admitting: Physician Assistant

## 2024-08-27 NOTE — Telephone Encounter (Signed)
 Please see pt concerns and advise if you would like patient to schedule an OV for evaluation or recommendations

## 2024-08-29 ENCOUNTER — Encounter: Payer: Self-pay | Admitting: Cardiology

## 2024-08-30 ENCOUNTER — Encounter: Payer: Self-pay | Admitting: Physician Assistant

## 2024-08-30 ENCOUNTER — Ambulatory Visit: Admitting: Internal Medicine

## 2024-08-30 ENCOUNTER — Ambulatory Visit: Payer: Self-pay

## 2024-08-30 ENCOUNTER — Telehealth: Admitting: Physician Assistant

## 2024-08-30 VITALS — Temp 102.0°F | Ht 64.0 in | Wt 158.0 lb

## 2024-08-30 DIAGNOSIS — R42 Dizziness and giddiness: Secondary | ICD-10-CM

## 2024-08-30 DIAGNOSIS — R509 Fever, unspecified: Secondary | ICD-10-CM

## 2024-08-30 DIAGNOSIS — R0989 Other specified symptoms and signs involving the circulatory and respiratory systems: Secondary | ICD-10-CM | POA: Diagnosis not present

## 2024-08-30 DIAGNOSIS — I959 Hypotension, unspecified: Secondary | ICD-10-CM

## 2024-08-30 MED ORDER — CEFDINIR 300 MG PO CAPS: 300.0000 mg | ORAL_CAPSULE | Freq: Two times a day (BID) | ORAL | 0 refills | Status: AC

## 2024-08-30 MED ORDER — OSELTAMIVIR PHOSPHATE 75 MG PO CAPS: 75.0000 mg | ORAL_CAPSULE | Freq: Two times a day (BID) | ORAL | 0 refills | Status: DC

## 2024-08-30 NOTE — Progress Notes (Signed)
° °  Virtual Visit via Video Note  I connected with  Teresa Rangel  on 08/30/2024 at 12:30 PM EST by a video enabled telemedicine application and verified that I am speaking with the correct person using two identifiers.  Location: Patient: home Provider: [employer] at [employer] Persons present: Patient, her husband, and myself   I discussed the limitations of evaluation and management by telemedicine and the availability of in person appointments. The patient expressed understanding and agreed to proceed.   History of Present Illness:  Discussed the use of AI scribe software for clinical note transcription with the patient, who gave verbal consent to proceed.  History of Present Illness Teresa Rangel is an 83 year old female who presents with flu symptoms.  She began experiencing flu-like symptoms approximately four to five days ago, including fever reaching 102F, dizziness, and low blood pressure. Her oxygen levels have been between 90 and 92, and her heart rate has been in the 60s. 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  She attributes her dizziness to fatigue from multiple performances and teaching activities. She was in contact with a student who recently had a fever of 103F. Her husband experienced similar symptoms and was prescribed steroids after an x-ray ruled out pneumonia.  Her blood pressure has been low, with the lowest reading being 87/59, but it has stabilized recently. She has a history of dizziness prior to this illness.  She uses Breo Ellipta  daily and has albuterol  available but has not used it yet. She is unsure about mixing it with her current medication. Her chest feels congested with mucus that is not loose.  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     Observations/Objective:   Gen:  Awake, alert, no acute distress Resp: Breathing is even and non-labored, productive cough Psych: calm/pleasant demeanor Neuro: Alert and Oriented x 3, + facial symmetry, speech is clear.   Assessment and Plan:  Assessment and Plan Assessment & Plan Suspected influenza with respiratory manifestations Suspected influenza with respiratory symptoms including fever, cough, and chest congestion. Symptoms began 4-5 days ago. Oxygen saturation is borderline low at 90-92%. Differential includes bronchitis and potential secondary pneumonia. High risk for complications due to age and current flu season. - Prescribed Tamiflu . - Prescribed cefdinir  300 mg twice daily for at least 5 days. - Advised use of albuterol  inhaler, 2 puffs 3-4 times a day. - Instructed to monitor oxygen saturation and seek medical attention if it drops into the 80s. - Advised to send a MyChart message with updates on vitals and symptoms by Monday.  Hypotension Intermittent hypotension with a recent low of 87/59 mmHg. Blood pressure has been low-normal recently, possibly exacerbated by illness and reduced oral intake. - Hold blood pressure medication for a few days. - Monitor blood pressure twice daily and update by Monday. - Consider reintroducing medication if blood pressure rises significantly.    Follow Up Instructions:    I discussed the assessment and treatment plan with the patient. The patient was provided an opportunity to ask questions and all were answered. The patient agreed with the plan and demonstrated an understanding of the instructions.   The patient was advised to call back or seek an in-person evaluation if the symptoms worsen or if the condition fails to improve as anticipated.  Amire Leazer M Caressa Scearce, PA-C

## 2024-08-30 NOTE — Telephone Encounter (Signed)
 Patient visit scheduled and completed with PCP

## 2024-08-30 NOTE — Telephone Encounter (Signed)
 FYI Only or Action Required?: FYI only for provider: would like meds sent in.  Patient was last seen in primary care on 07/26/2024 by Allwardt, Mardy HERO, PA-C.  Called Nurse Triage reporting Influenza.  Symptoms began yesterday.  Interventions attempted: OTC medications: mucinex.  Symptoms are: gradually worsening.  Triage Disposition: Call PCP Within 24 Hours  Patient/caregiver understands and will follow disposition?: Yes, will follow disposition  Copied from CRM #[id number]. Topic: Clinical - Red Word Triage >> Aug 30, 2024  8:41 AM Antony RAMAN wrote: Red Word that prompted transfer to Nurse Triage: requesting a nurse, fever was 102, has flu Reason for Disposition  [1] Influenza EXPOSURE (Close Contact) within last 48 hours (2 days) AND [2] exposed person is HIGH RISK (e.g., 65 years and older, pregnant, HIV+, chronic medical condition)  Answer Assessment - Initial Assessment Questions 1. TYPE of EXPOSURE: How were you exposed? (e.g., close contact, not a close contact)     Close contact 2. DATE of EXPOSURE: When did the exposure occur? (e.g., hour, days, weeks)     2 nights ago 3. SYMPTOMS: Do you have any symptoms? (e.g., cough, fever, sore throat, difficulty breathing).     Cough, temp at highest 102,  4. HIGH RISK for COMPLICATIONS: Do you have any heart or lung problems? Do you have a weakened immune system? (e.g., CHF, COPD, asthma, HIV positive, chemotherapy, renal failure, diabetes mellitus, sickle cell anemia)     Denies  Pt requesting tamiflu  and anything else you think might help, pt states she is to weak to come in and doesn't want to spread germs.  Protocols used: Influenza (Flu) Exposure-A-AH

## 2024-08-30 NOTE — Telephone Encounter (Signed)
 See triage note and pt positive flu triage note and advise

## 2024-08-30 NOTE — Patient Instructions (Signed)
°  VISIT SUMMARY: You came in today with flu symptoms that started 4-5 days ago, including fever, dizziness, and low blood pressure. Your oxygen levels have been slightly low, and your symptoms have worsened, affecting your daily activities.  YOUR PLAN: SUSPECTED INFLUENZA WITH RESPIRATORY MANIFESTATIONS: You have flu symptoms including fever, cough, and chest congestion. Your oxygen levels are slightly low, and there is a risk of complications due to your age. -Start taking Tamiflu  as prescribed. -Take cefdinir  300 mg twice daily for at least 5 days. -Use your albuterol  inhaler, 2 puffs 3-4 times a day. -Monitor your oxygen levels and seek medical attention if they drop into the 80s. -Send a MyChart message with updates on your vitals and symptoms by Monday.  HYPOTENSION: Your blood pressure has been low, which may be worsened by your illness and reduced fluid intake. -Hold off on taking your blood pressure medication for a few days. -Monitor your blood pressure twice daily and update us  by Monday. -Consider restarting your medication if your blood pressure rises significantly.                      Contains text generated by Abridge.                                 Contains text generated by Abridge.

## 2024-08-31 ENCOUNTER — Encounter: Payer: Self-pay | Admitting: Physician Assistant

## 2024-08-31 NOTE — Telephone Encounter (Signed)
 Please see pt msg as FYI and update

## 2024-09-02 ENCOUNTER — Encounter: Payer: Self-pay | Admitting: Physician Assistant

## 2024-09-03 NOTE — Telephone Encounter (Signed)
 Please see update on patient and advise if anything further is recommended

## 2024-09-03 NOTE — Telephone Encounter (Signed)
 Please call patient and schedule UC follow up with PCP

## 2024-09-04 NOTE — Telephone Encounter (Signed)
 LVM to schedule UC Follow up. Please transfer to Direct[employer]

## 2024-09-07 ENCOUNTER — Ambulatory Visit: Payer: Self-pay

## 2024-09-07 ENCOUNTER — Other Ambulatory Visit: Payer: Self-pay

## 2024-09-07 ENCOUNTER — Emergency Department (HOSPITAL_COMMUNITY)

## 2024-09-07 ENCOUNTER — Emergency Department (HOSPITAL_COMMUNITY)
Admission: EM | Admit: 2024-09-07 | Discharge: 2024-09-07 | Disposition: A | Discharge status: Home / Self Care | Source: Ambulatory Visit

## 2024-09-07 DIAGNOSIS — R0602 Shortness of breath: Secondary | ICD-10-CM | POA: Diagnosis not present

## 2024-09-07 DIAGNOSIS — R0981 Nasal congestion: Secondary | ICD-10-CM | POA: Insufficient documentation

## 2024-09-07 DIAGNOSIS — J449 Chronic obstructive pulmonary disease, unspecified: Secondary | ICD-10-CM | POA: Diagnosis not present

## 2024-09-07 DIAGNOSIS — Z79899 Other long term (current) drug therapy: Secondary | ICD-10-CM | POA: Insufficient documentation

## 2024-09-07 DIAGNOSIS — Z7951 Long term (current) use of inhaled steroids: Secondary | ICD-10-CM | POA: Diagnosis not present

## 2024-09-07 DIAGNOSIS — R531 Weakness: Secondary | ICD-10-CM | POA: Insufficient documentation

## 2024-09-07 DIAGNOSIS — R059 Cough, unspecified: Secondary | ICD-10-CM | POA: Diagnosis present

## 2024-09-07 DIAGNOSIS — J3489 Other specified disorders of nose and nasal sinuses: Secondary | ICD-10-CM | POA: Diagnosis not present

## 2024-09-07 DIAGNOSIS — I10 Essential (primary) hypertension: Secondary | ICD-10-CM | POA: Insufficient documentation

## 2024-09-07 LAB — COMPREHENSIVE METABOLIC PANEL WITH GFR
ALT: 14 U/L (ref 0–44)
AST: 25 U/L (ref 15–41)
Albumin: 3.9 g/dL (ref 3.5–5.0)
Alkaline Phosphatase: 82 U/L (ref 38–126)
Anion gap: 12 (ref 5–15)
BUN: 9 mg/dL (ref 8–23)
CO2: 26 mmol/L (ref 22–32)
Calcium: 9.2 mg/dL (ref 8.9–10.3)
Chloride: 102 mmol/L (ref 98–111)
Creatinine, Ser: 0.86 mg/dL (ref 0.44–1.00)
GFR, Estimated: >60 mL/min
Glucose, Bld: 135 mg/dL — ABNORMAL HIGH (ref 70–99)
Potassium: 3.8 mmol/L (ref 3.5–5.1)
Sodium: 140 mmol/L (ref 135–145)
Total Bilirubin: 0.4 mg/dL (ref 0.0–1.2)
Total Protein: 6.7 g/dL (ref 6.5–8.1)

## 2024-09-07 LAB — CBC WITH DIFFERENTIAL/PLATELET
Abs Immature Granulocytes: 0.02 K/uL (ref 0.00–0.07)
Basophils Absolute: 0 K/uL (ref 0.0–0.1)
Basophils Relative: 1 %
Eosinophils Absolute: 0.2 K/uL (ref 0.0–0.5)
Eosinophils Relative: 3 %
HCT: 43 % (ref 36.0–46.0)
Hemoglobin: 14.4 g/dL (ref 12.0–15.0)
Immature Granulocytes: 0 %
Lymphocytes Relative: 15 %
Lymphs Abs: 1 K/uL (ref 0.7–4.0)
MCH: 29 pg (ref 26.0–34.0)
MCHC: 33.5 g/dL (ref 30.0–36.0)
MCV: 86.5 fL (ref 80.0–100.0)
Monocytes Absolute: 0.3 K/uL (ref 0.1–1.0)
Monocytes Relative: 5 %
Neutro Abs: 4.7 K/uL (ref 1.7–7.7)
Neutrophils Relative %: 76 %
Platelets: 273 K/uL (ref 150–400)
RBC: 4.97 MIL/uL (ref 3.87–5.11)
RDW: 12.5 % (ref 11.5–15.5)
WBC: 6.2 K/uL (ref 4.0–10.5)
nRBC: 0 % (ref 0.0–0.2)

## 2024-09-07 LAB — RESP PANEL BY RT-PCR (RSV, FLU A&B, COVID)  RVPGX2
Influenza A by PCR: NEGATIVE
Influenza B by PCR: NEGATIVE
Resp Syncytial Virus by PCR: NEGATIVE
SARS Coronavirus 2 by RT PCR: NEGATIVE

## 2024-09-07 LAB — MAGNESIUM: Magnesium: 1.9 mg/dL (ref 1.7–2.4)

## 2024-09-07 MED ORDER — IPRATROPIUM-ALBUTEROL 0.5-2.5 (3) MG/3ML IN SOLN
3.0000 mL | Freq: Once | RESPIRATORY_TRACT | Status: AC
  Administered 2024-09-07: 3 mL via RESPIRATORY_TRACT
  Filled 2024-09-07: qty 3

## 2024-09-07 MED ORDER — PREDNISONE 20 MG PO TABS: 40.0000 mg | ORAL_TABLET | Freq: Every day | ORAL | 0 refills | Status: AC

## 2024-09-07 MED ORDER — METHYLPREDNISOLONE SODIUM SUCC 125 MG IJ SOLR
125.0000 mg | Freq: Once | INTRAMUSCULAR | Status: AC
  Administered 2024-09-07: 125 mg via INTRAVENOUS
  Filled 2024-09-07: qty 2

## 2024-09-07 NOTE — Discharge Instructions (Addendum)
 Thank you for visiting the Emergency Department today. It was a pleasure to be part of your healthcare team.   Your were seen today for an ongoing cough, as discussed, your imaging and laboratory studies were reassuring.   You have been prescribed prednisone , and you should continue taking your antibiotic that was prescribed. Please take all medications as directed. If you have any questions about your medicines, please call your pharmacy or healthcare provider.  It is important to watch for warning signs such as worsening pain, fever, trouble breathing, or chest pain. If any of these happen, return to the Emergency Department or call 911.  Please follow up with your primary care provider within 1 week for reevaluation .   Thank you for trusting us  with your health.

## 2024-09-07 NOTE — ED Notes (Signed)
 Tech ambulated pt. Down hallway while attached to monitor. O2 sat @98  upon leaving and held at 97 during ambulation. Pt. Returned to bed and attached to monitor. O2 sat @98  while resting

## 2024-09-07 NOTE — ED Notes (Signed)
 Awaiting pt from lobby

## 2024-09-07 NOTE — Telephone Encounter (Signed)
 FYI Only or Action Required?: FYI only for provider: UC/ED now.  Patient was last seen in primary care on 08/30/2024 by Allwardt, Mardy HERO, PA-C.  Called Nurse Triage reporting Breathing Problem.  Symptoms began several days ago.  Interventions attempted: Prescription medications: Levofloxacin 500mg .  Symptoms are: unchanged.  Triage Disposition: See HCP Within 4 Hours (Or PCP Triage)  Patient/caregiver understands and will follow disposition?: Yes   Copied from CRM #[id number]. Topic: Clinical - Red Word Triage >> Sep 07, 2024 10:33 AM Jasmin G wrote: Red Word that prompted transfer to Nurse Triage: Pneumonia symptoms that are not getting any better despite treatment.   Reason for Disposition  [1] MILD difficulty breathing (e.g., minimal/no SOB at rest, SOB with walking, pulse < 100) AND [2] NEW-onset or WORSE than normal  Answer Assessment - Initial Assessment Questions No available appts today. Advised UC/ED now; ED advised levofloxacin not improving. Pt and pt's spouse reports will go to ED.  Advised 911 if symptoms worsen: severe diff breathing, chest pain >5 min, faint/pass out. Pt verbalized understanding.   1. RESPIRATORY STATUS: Describe your breathing? (e.g., wheezing, shortness of breath, unable to speak, severe coughing)      Sob only with coughing, non productive cough, weakness 2. ONSET: When did this breathing problem begin?      08/24/24 3. PATTERN Does the difficult breathing come and go, or has it been constant since it started?      Comes and goes with coughing 4. SEVERITY: How bad is your breathing? (e.g., mild, moderate, severe)      severe 6. CARDIAC HISTORY: Do you have any history of heart disease? (e.g., heart attack, angina, bypass surgery, angioplasty)      no 7. LUNG HISTORY: Do you have any history of lung disease?  (e.g., pulmonary embolus, asthma, emphysema)     chronic bronchitis 8. CAUSE: What do you think is causing the breathing  problem?      pneumonia 9. OTHER SYMPTOMS: Do you have any other symptoms? (e.g., chest pain, cough, dizziness, fever, runny nose)     Denies diff breathing, chest pain,  10. O2 SATURATION MONITOR:  Do you use an oxygen saturation monitor (pulse oximeter) at home? If Yes, ask: What is your reading (oxygen level) today? What is your usual oxygen saturation reading? (e.g., 95%) 94% RA, HR 60; currently    UC visit 08/24/24; dx pneumonia Taking levofloxacin; 7 day out 10 Using albuterol  neb tx; coughing spell Mucinex  Denies fever, chills,n/v/d, diff breathing, chest pain, faint  Protocols used: Breathing Difficulty-A-AH

## 2024-09-07 NOTE — ED Notes (Signed)
 CCMD called this RN for cardiac monitoring.

## 2024-09-07 NOTE — ED Triage Notes (Signed)
 Pt diagnosed with pneumonia on Sunday at a walk in clinic. Pt c.o worsening sob, cough and weakness.

## 2024-09-07 NOTE — Telephone Encounter (Signed)
 FYI Only or Action Required?: Action required by provider: request for appointment.  Patient was last seen in primary care on 08/30/2024 by Allwardt, Mardy HERO, PA-C.  Called Nurse Triage reporting Cough.  Symptoms began a week ago.  Interventions attempted: Prescription medications: antibiotic from UC.  Symptoms are: gradually worsening.Seen in UC last Sunday and treated for pneumonia. No better, cough and wheeze worse.  Triage Disposition: Go to ED Now (or PCP Triage)  Patient/caregiver understands and will follow disposition?:      Copied from CRM #[id number]. Topic: Clinical - Red Word Triage >> Sep 07, 2024 11:09 AM Rozanna MATSU wrote: Red Word that prompted transfer to Nurse Triage: pt stated went urgent care last Sunday and was told she has pneumonia and is not feeling any better. Reason for Disposition  Patient sounds very sick or weak to the triager  Answer Assessment - Initial Assessment Questions 1. ONSET: When did the cough begin?      Last week 2. SEVERITY: How bad is the cough today?      severe 3. SPUTUM: Describe the color of your sputum (e.g., none, dry cough; clear, [place], yellow, green)     no 4. HEMOPTYSIS: Are you coughing up any blood? If Yes, ask: How much? (e.g., flecks, streaks, tablespoons, etc.)     no 5. DIFFICULTY BREATHING: Are you having difficulty breathing? If Yes, ask: How bad is it? (e.g., mild, moderate, severe)      mild 6. FEVER: Do you have a fever? If Yes, ask: What is your temperature, how was it measured, and when did it start?     no 7. CARDIAC HISTORY: Do you have any history of heart disease? (e.g., heart attack, congestive heart failure)      no 8. LUNG HISTORY: Do you have any history of lung disease?  (e.g., pulmonary embolus, asthma, emphysema)     *No Answer* 9. PE RISK FACTORS: Do you have a history of blood clots? (or: recent major surgery, recent prolonged travel, bedridden)     bronchitis 10. OTHER  SYMPTOMS: Do you have any other symptoms? (e.g., runny nose, wheezing, chest pain)       wheezing 11. PREGNANCY: Is there any chance you are pregnant? When was your last menstrual period?       no 12. TRAVEL: Have you traveled out of the country in the last month? (e.g., travel history, exposures)       no  Protocols used: Cough - Acute Non-Productive-A-AH

## 2024-09-07 NOTE — ED Provider Notes (Signed)
 " [facility] Provider Note   CSN: [id number] Arrival date & time: 09/07/24  1240     Patient presents with: Cough and Weakness   Teresa Rangel is a 83 y.o. female with a history of hypertension, presents to the ED with worsening cough.  Patient states that last Sunday she was seen at urgent care and diagnosed with bilateral pneumonia and placed on an antibiotic regimen.  Patient states that since starting the antibiotic her symptoms have significantly improved - she has stopped experiencing fevers and myalgias, however continues to have a productive cough and wax/wane shortness of breath.  The patient states that she has been utilizing at home nebulizer treatments with minimal relief. The patient denies chest pain, dyspnea at rest, hemoptysis, syncope, confusion, neck stiffness, or focal neurological deficits. She denies any nausea, vomiting, or diarrhea since starting the antibiotic regimen.  The patient is in no acute distress.    Cough Weakness Associated symptoms: cough        Prior to Admission medications  Medication Sig Start Date End Date Taking? Authorizing Provider  albuterol  (VENTOLIN  HFA) 108 (90 Base) MCG/ACT inhaler Inhale 1-2 puffs into the lungs every 6 (six) hours as needed for wheezing or shortness of breath. 01/10/24   Cobb, Comer GAILS, NP  amLODipine  (NORVASC ) 10 MG tablet TAKE 1 TABLET BY MOUTH DAILY 08/03/24   Shlomo Wilbert SAUNDERS, MD  BREO ELLIPTA  200-25 MCG/ACT AEPB INHALE 1 PUFF BY MOUTH DAILY 07/16/24   Cobb, Katherine V, NP  carvedilol  (COREG ) 6.25 MG tablet TAKE 1 TABLET BY MOUTH 2 TIMES A DAY 04/19/24   Turner, Wilbert SAUNDERS, MD  estradiol  (ESTRACE ) 0.01 % CREA vaginal cream Place 0.5 g vaginally 2 (two) times a week. Place 0.5g nightly for two weeks then twice a week after 08/09/24   Zuleta, Kaitlin G, NP  FLUoxetine  (PROZAC ) 40 MG capsule Take 1 capsule (40 mg total) by mouth daily. 07/02/24   Allwardt, Alyssa M, PA-C   fluticasone  (FLONASE ) 50 MCG/ACT nasal spray Place 2 sprays into both nostrils daily. 01/10/24   Cobb, Comer GAILS, NP  hydrochlorothiazide  (HYDRODIURIL ) 25 MG tablet Take 25 mg by mouth daily.    [provider]  hydrocortisone  (ANUSOL -HC) 2.5 % rectal cream Place 1 Application rectally 3 (three) times daily. 06/12/24   Zehr, Jessica D, PA-C  irbesartan  (AVAPRO ) 75 MG tablet Take 1 tablet (75 mg total) by mouth daily. 03/26/24   Shlomo Wilbert SAUNDERS, MD  levothyroxine  (SYNTHROID ) 88 MCG tablet TAKE 1 TABLET BY MOUTH DAILY BEFORE BREAKFAST 08/20/24   Allwardt, Alyssa M, PA-C  Multiple Vitamins-Minerals (PRESERVISION AREDS 2 PO) Take 1 tablet by mouth daily.    [provider]  oseltamivir  (TAMIFLU ) 75 MG capsule Take 1 capsule (75 mg total) by mouth 2 (two) times daily. 08/30/24   Allwardt, Alyssa M, PA-C  pravastatin  (PRAVACHOL ) 80 MG tablet TAKE 1 TABLET BY MOUTH EVERY EVENING 06/07/24   Turner, Traci R, MD  TURMERIC PO Take 1 tablet by mouth daily.    [provider]    Allergies: Molds & smuts and Tetracyclines & related    Review of Systems  Respiratory:  Positive for cough.   Neurological:  Positive for weakness.    Updated Vital Signs BP 133/64   Pulse 68   Temp 98 F (36.7 C)   Resp 13   Ht 5' 4 (1.626 m)   Wt 71 kg   LMP  (LMP Unknown)  SpO2 97%   BMI 26.87 kg/m   Physical Exam Vitals and nursing note reviewed.  Constitutional:      General: She is awake. She is not in acute distress.    Appearance: Normal appearance. She is not toxic-appearing.  HENT:     Head: Normocephalic and atraumatic.     Right Ear: Hearing, tympanic membrane and ear canal normal.     Left Ear: Hearing, tympanic membrane and ear canal normal.     Nose: Congestion and rhinorrhea present. Rhinorrhea is clear.     Right Sinus: Maxillary sinus tenderness present.     Left Sinus: Maxillary sinus tenderness present.     Mouth/Throat:     Mouth: Mucous membranes are moist.      Pharynx: Uvula midline. No pharyngeal swelling, oropharyngeal exudate, posterior oropharyngeal erythema or uvula swelling.     Tonsils: No tonsillar exudate or tonsillar abscesses.  Eyes:     General: Lids are normal. Vision grossly intact.     Extraocular Movements: Extraocular movements intact.     Conjunctiva/sclera: Conjunctivae normal.     Pupils: Pupils are equal, round, and reactive to light.  Cardiovascular:     Rate and Rhythm: Normal rate and regular rhythm.     Pulses:          Radial pulses are 2+ on the right side.  Pulmonary:     Effort: Pulmonary effort is normal.     Breath sounds: Examination of the right-upper field reveals wheezing. Examination of the left-upper field reveals wheezing. Wheezing present.     Comments: Mild wheezes auscultated to bilateral upper lobes.  No decreased air movement or respiratory distress. Patient has no difficulty speaking in complete sentences.  Abdominal:     General: Abdomen is flat.     Palpations: Abdomen is soft.     Tenderness: There is no abdominal tenderness.  Musculoskeletal:     Cervical back: Full passive range of motion without pain and neck supple. No rigidity. No spinous process tenderness.  Lymphadenopathy:     Cervical: Cervical adenopathy present.  Skin:    General: Skin is warm and dry.     Capillary Refill: Capillary refill takes less than 2 seconds.     Findings: No rash.  Neurological:     General: No focal deficit present.     Mental Status: She is alert.  Psychiatric:        Attention and Perception: Attention normal.        Mood and Affect: Mood normal.        Speech: Speech normal.     (all labs ordered are listed, but only abnormal results are displayed) Labs Reviewed  COMPREHENSIVE METABOLIC PANEL WITH GFR - Abnormal; Notable for the following components:      Result Value   Glucose, Bld 135 (*)    All other components within normal limits  RESP PANEL BY RT-PCR (RSV, FLU A&B, COVID)  RVPGX2  CBC  WITH DIFFERENTIAL/PLATELET  MAGNESIUM    EKG: EKG Interpretation Date/Time:  Friday September 07 2024 13:00:20 EST Ventricular Rate:  78 PR Interval:  144 QRS Duration:  84 QT Interval:  408 QTC Calculation: 465 R Axis:   -17  Text Interpretation: Normal sinus rhythm Possible Anterior infarct , age undetermined Abnormal ECG When compared with ECG of 20-Jan-2024 08:11, PREVIOUS ECG IS PRESENT Confirmed by Neysa Clap [id number]) on 09/07/2024 1:32:08 PM  Radiology: No results found.   Procedures   Medications Ordered in the ED  methylPREDNISolone  sodium succinate (SOLU-MEDROL ) 125 mg/2 mL injection 125 mg (125 mg Intravenous Given 09/07/24 1444)  ipratropium-albuterol  (DUONEB) 0.5-2.5 (3) MG/3ML nebulizer solution 3 mL (3 mLs Nebulization Given 09/07/24 1418)  ipratropium-albuterol  (DUONEB) 0.5-2.5 (3) MG/3ML nebulizer solution 3 mL (3 mLs Nebulization Given 09/07/24 1418)                                 Medical Decision Making Amount and/or Complexity of Data Reviewed Labs:  Decision-making details documented in ED Course.   Patient presents to the ED for: cough, shortness of breath This involves an extensive number of treatment options  Differential diagnosis includes: Infectious etiology PE Co-morbid conditions: Hypertension, COPD  Additional history/records obtained and reviewed: Additional history obtained from  husband who presented as a good historian  Clinical Course as of 09/13/24 1447  Fri Sep 07, 2024  1353 Temp: 98 F (36.7 C) afebrile, vital stable, patient in no acute distress [ML]  1353 CBC with Differential/Platelet WNL [ML]  1418 Comprehensive metabolic panel with GFR(!) No acute findings [ML]  1418 Magnesium WNL [ML]  1418 ED EKG Normal sinus [ML]  1423 DG Chest 2 View No acute findings [ML]  1439 Resp panel by RT-PCR (RSV, Flu A&B, Covid) Anterior Nasal Swab Negative   [ML]  1445 Patient given duoneb x 2 and methylprednisolone  for  symptomatic relief - well tolerated  [ML]  1500 Walking O2 patient did not drop below 97% - clear for discharge  [ML]    Clinical Course User Index [ML] Willma Duwaine CROME, PA    Data Reviewed / Actions Taken: Labs ordered/reviewed with my independent interpretation in ED course above. Imaging ordered/reviewed with my independent interpretation in ED course above. I agree with the radiologists interpretation.  EKG ordered/reviewed with my independent interpretation in ED course above.   Management / Treatments: See ED course above for medications, treatments administered, and clinical rationale.   Reevaluation of the patient after these medicines showed that the patient improved.  I have reviewed the patients home medicines and have made adjustments as needed  Test Considered/Diagnostic tools:  D-dimer study was considered given patient's history of wax/wane shortness of breath, however patient did not complain of any chest pain, hemoptysis, unilateral leg swelling, recent immobilization, syncope, or other symptoms concerning for PE at this time.  Clinical presentation and exam are overall low risk, and no additional PE risk factors were identified.  Given the low pretest probability, D-dimer testing was not obtained at this time.   ED Course / Reassessments: Problem List: cough 83 year old female presented for evaluation of a cough. Initial assessment included history, physical exam, and review of prior medical records. Laboratory testing was obtained given clinical presentation without acute findings. Unable to see patient's prior chest x-ray from 12/21  demonstrating the bilateral pneumonia, however imaging here was reassuring and showed no active infectious etiology/infiltrates. There is low clinical suspicion at this time for worsening pneumonia based on reassuring imaging, vital signs, and overall clinical appearance. The patient was treated symptomatically with steroids, bronchodilators, and  supportive care during visit. The patient remained stable during the ED course and was deemed appropriate for outpatient management after successful ambulatory pulse oximetry readings.  Discharge planning include strict return precautions for worsening respiratory symptoms, new/persistent high fevers, chest pain, inability to tolerate oral intake, or new neurological symptoms.  The patient was advised on continued supportive care at home, including hydration, rest, and antipyretic  use, bronchodilators, and prescribed short steroid burst. Serial reassessments performed: Yes    Disposition: Disposition: Discharge with close follow-up with PCP for further evaluation and care Rationale for disposition: Stable for discharge The disposition plan and rationale were discussed with the patient at the bedside, all questions were addressed, and the patient demonstrated understanding.  This note was produced using Electronics Engineer. While I have reviewed and verified all clinical information, transcription errors may remain.      Final diagnoses:  Cough, unspecified type    ED Discharge Orders          Ordered    predniSONE  (DELTASONE ) 20 MG tablet  Daily        09/07/24 1559               Willma Duwaine CROME, [place] 09/13/24 1457    Franklyn Sid SAILOR, MD 09/17/24 1743  "

## 2024-09-07 NOTE — ED Triage Notes (Signed)
 PT complains of cough, SOB with exertion. DX with pneumonia and has been on antibiotics for 6 days but does not feel any better and is feeling weak. Pain only when coughing. Called PCP and was advised to come to ER.

## 2024-09-07 NOTE — Telephone Encounter (Signed)
 Alyssa out of office today and no available appts or work in visits; only 3 providers; CAL reports.

## 2024-09-07 NOTE — ED Notes (Signed)
 Patient transported to X-ray

## 2024-09-07 NOTE — ED Provider Triage Note (Signed)
 Emergency Medicine Provider Triage Evaluation Note  Teresa Rangel , a 83 y.o. female  was evaluated in triage.  Pt complains of persistent cough and shortness of breath.  Known history of pneumonia.  Not improved after 5 days of Levaquin 500 mg dose.  Called her pulmonary doctor as well as her PCP and they recommended she come to the emergency department.  No chest pain or other anginal symptoms.  Cough is dry.  Review of Systems  Positive: As above Negative: As above  Physical Exam  BP (!) 144/85   Pulse 79   Temp 98 F (36.7 C)   Resp 17   Ht 5' 4 (1.626 m)   Wt 71 kg   LMP  (LMP Unknown)   SpO2 95%   BMI 26.87 kg/m  Gen:   Awake, no distress   Resp:  Normal effort  MSK:   Moves extremities without difficulty Other:    Medical Decision Making  Medically screening exam initiated at 1:04 PM.  Appropriate orders placed.  Teresa Rangel was informed that the remainder of the evaluation will be completed by another provider, this initial triage assessment does not replace that evaluation, and the importance of remaining in the ED until their evaluation is complete.     Hildegard Loge, PA-C 09/07/24 [phone]

## 2024-09-07 NOTE — Telephone Encounter (Signed)
 FYI- Patient does have an appointment scheduled on 09/14/2024

## 2024-09-10 NOTE — Telephone Encounter (Signed)
 Noted and agreed, thank you.

## 2024-09-14 ENCOUNTER — Ambulatory Visit (INDEPENDENT_AMBULATORY_CARE_PROVIDER_SITE_OTHER): Admitting: Family

## 2024-09-14 ENCOUNTER — Encounter: Payer: Self-pay | Admitting: Family

## 2024-09-14 ENCOUNTER — Ambulatory Visit: Admitting: Physician Assistant

## 2024-09-14 VITALS — BP 102/60 | HR 74 | Temp 97.8°F | Ht 64.0 in | Wt 158.4 lb

## 2024-09-14 DIAGNOSIS — J209 Acute bronchitis, unspecified: Secondary | ICD-10-CM

## 2024-09-14 MED ORDER — PREDNISONE 20 MG PO TABS: ORAL_TABLET | ORAL | 0 refills | Status: DC

## 2024-09-14 MED ORDER — BENZONATATE 100 MG PO CAPS: 100.0000 mg | ORAL_CAPSULE | Freq: Three times a day (TID) | ORAL | 0 refills | Status: AC | PRN

## 2024-09-14 NOTE — Progress Notes (Signed)
 "  Patient ID: Teresa Rangel, female    DOB: 1941/06/21, 84 y.o.   MRN: [id number]  Chief Complaint  Patient presents with   Cough    Pt was seen in UC on 12/21 for Sx of cough and congestion worsening. Patient was dx with pneumonia in both lungs. Has tried levofloxacin 500 mg for 10 days.   Discussed the use of AI scribe software for clinical note transcription with the patient, who gave verbal consent to proceed.  History of Present Illness Teresa Rangel is an 84 year old female who presents with persistent symptoms following a lung infection.  She has had lung infection symptoms for about three weeks. She was started on an antibiotic and Tamiflu  on December 18, then diagnosed with bilateral pneumonia by chest x-ray at urgent care on December 21 and treated with 10 days of Levaquin. She reports a severe, persistent cough and difficulty expectorating mucus despite Mucinex. She has bronchitis and uses Breo daily, she states she was told 2 puffs bid by her pulmonologist, higher than the recommended dosing. IV steroids and a nebulizer treatment in the ER improved her breathing, along with 5 days more of oral steroids, but caused 3 to 4 days of insomnia. She has been using her HHI albuterol  2 to 3 times daily and has used her home nebulizer (grandbaby's device) several times with albuterol , but not lately. She completed Levaquin and a 5-day course of prednisone  40 mg each morning, which improved her cough but caused significant sleep disturbance. She now feels weak, dizzy, and has decreased appetite but is eating some protein. Her blood pressure has been variable, with prior high readings during stress such as around Christmas. She has had dehydration in the past and uses liquid IV at home. Before going to the ER for severe cough and dyspnea, she used a home nebulizer. She reports that her oxygen levels and heart rate have stayed normal.  Assessment & Plan Acute bronchitis and  pneumonia Pneumonia resolved on x-ray on 12/26 and CBC wnl. Mild rhonchi in upper lobes. Current symptoms include dizziness and weakness, likely from dehydration and hypotension. Oxygen and heart rate stable. - Continue prednisone  20 mg for 5-7 days, morning dosing after breakfast. - Consider Benadryl 25mg  at night for sleep. - Use nebulizer with albuterol  twice daily, especially before bedtime. - Prescribed benzonatate  100mg  tid for cough as needed. - Ensure hydration with at least four water bottles daily, consider Liquid IV 2 x/day. - Monitor blood pressure, ensure nutrition with small, frequent meals throughout the day. - Call back Monday if sx are still not improved.  Subjective:    Outpatient Medications Prior to Visit  Medication Sig Dispense Refill   albuterol  (VENTOLIN  HFA) 108 (90 Base) MCG/ACT inhaler Inhale 1-2 puffs into the lungs every 6 (six) hours as needed for wheezing or shortness of breath. 8 g 2   amLODipine  (NORVASC ) 10 MG tablet TAKE 1 TABLET BY MOUTH DAILY 90 tablet 1   BREO ELLIPTA  200-25 MCG/ACT AEPB INHALE 1 PUFF BY MOUTH DAILY 60 each 5   carvedilol  (COREG ) 6.25 MG tablet TAKE 1 TABLET BY MOUTH 2 TIMES A DAY 180 tablet 3   estradiol  (ESTRACE ) 0.01 % CREA vaginal cream Place 0.5 g vaginally 2 (two) times a week. Place 0.5g nightly for two weeks then twice a week after 42 g 11   FLUoxetine  (PROZAC ) 40 MG capsule Take 1 capsule (40 mg total) by mouth daily. 90 capsule 0   fluticasone  (FLONASE )  50 MCG/ACT nasal spray Place 2 sprays into both nostrils daily. 16 g 6   hydrochlorothiazide  (HYDRODIURIL ) 25 MG tablet Take 25 mg by mouth daily.     hydrocortisone  (ANUSOL -HC) 2.5 % rectal cream Place 1 Application rectally 3 (three) times daily. 45 g 3   irbesartan  (AVAPRO ) 75 MG tablet Take 1 tablet (75 mg total) by mouth daily. 90 tablet 3   levothyroxine  (SYNTHROID ) 88 MCG tablet TAKE 1 TABLET BY MOUTH DAILY BEFORE BREAKFAST 90 tablet 0   Multiple Vitamins-Minerals  (PRESERVISION AREDS 2 PO) Take 1 tablet by mouth daily.     oseltamivir  (TAMIFLU ) 75 MG capsule Take 1 capsule (75 mg total) by mouth 2 (two) times daily. 10 capsule 0   pravastatin  (PRAVACHOL ) 80 MG tablet TAKE 1 TABLET BY MOUTH EVERY EVENING 90 tablet 1   TURMERIC PO Take 1 tablet by mouth daily.     No facility-administered medications prior to visit.   Past Medical History:  Diagnosis Date   Allergy    History of stomach ulcers 1980   Hyperlipidemia    Hypertension    Hypothyroidism    Macular degeneration of left eye    OSA (obstructive sleep apnea)    mild obstructive sleep apnea with an AHI of 10/hr.  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  Constitutional:      Appearance: Normal appearance.  Cardiovascular:     Rate and Rhythm: Normal rate and regular rhythm.  Pulmonary:     Effort: Pulmonary effort is normal.     Breath sounds: Examination of the right-upper field reveals rhonchi. Examination of the left-upper field reveals rhonchi. Examination of the right-middle field reveals rhonchi. Rhonchi (mild) present.  Musculoskeletal:        General: Normal range of motion.  Skin:    General: Skin is warm and dry.  Neurological:     Mental Status: She is alert.  Psychiatric:        Mood and Affect: Mood normal.        Behavior: Behavior normal.    BP 102/60 (BP Location: Left Arm, Patient Position: Sitting, Cuff Size: Large)   Pulse 74   Temp 97.8 F (36.6 C) (Temporal)   Ht 5' 4 (1.626 m)   Wt 158 lb 6.4 oz (71.8 kg)   LMP  (LMP Unknown)   SpO2  95%   BMI 27.19 kg/m  Wt Readings from Last 3 Encounters:  09/14/24 158 lb 6.4 oz (71.8 kg)  09/07/24 156 lb 8.4 oz (71 kg)  08/30/24 158 lb (71.7 kg)      Clebert Wenger, NP     [1]  Allergies Allergen Reactions   Molds & Smuts Cough    Other reaction(s): cough   Tetracyclines & Related Other (See Comments)    Stomach cramps   "

## 2024-09-14 NOTE — Patient Instructions (Addendum)
 It was very nice to see you today!  I have sent over a low dose of Prednisone  to take after breakfast. You can take 1 tablet of Benadryl at bedtime to help with sleep. I also sent over the Tessalon  pearles to help with your cough. Continue to use the nebulizer with Albuterol  twice a day to help open up your lungs for the next 3-4 days. Use the Breo as directed by your Pulmonologist. Increase water intake to at least 2 liters all through the day. Drink 1-2 Liquid IV daily for next 3-4 days. You need to try to eat small bites of food all through the day to keep your energy up.  Call back if your symptoms are not better next week!       PLEASE NOTE:  If you had any lab tests please let us  know if you have not heard back within a few days. You may see your results on MyChart before we have a chance to review them but we will give you a call once they are reviewed by us . If we ordered any referrals today, please let us  know if you have not heard from their office within the next week.

## 2024-09-24 ENCOUNTER — Encounter: Payer: Self-pay | Admitting: *Deleted

## 2024-09-30 ENCOUNTER — Other Ambulatory Visit: Payer: Self-pay | Admitting: Physician Assistant

## 2024-10-11 ENCOUNTER — Encounter: Payer: Self-pay | Admitting: Physician Assistant

## 2024-10-12 ENCOUNTER — Encounter: Payer: Self-pay | Admitting: Physician Assistant

## 2024-10-12 ENCOUNTER — Ambulatory Visit (INDEPENDENT_AMBULATORY_CARE_PROVIDER_SITE_OTHER): Admitting: Physician Assistant

## 2024-10-12 VITALS — BP 132/68 | HR 60 | Temp 97.2°F | Ht 64.0 in | Wt 160.0 lb

## 2024-10-12 DIAGNOSIS — J069 Acute upper respiratory infection, unspecified: Secondary | ICD-10-CM

## 2024-10-12 MED ORDER — AMOXICILLIN 875 MG PO TABS: 875.0000 mg | ORAL_TABLET | Freq: Two times a day (BID) | ORAL | 0 refills | Status: AC

## 2024-10-12 NOTE — Progress Notes (Signed)
 "   Patient ID: Teresa Rangel, female    DOB: [birth year], 84 y.o.   MRN: [id number]   Assessment & Plan:  Acute URI  Other orders -     Amoxicillin ; Take 1 tablet (875 mg total) by mouth 2 (two) times daily for 7 days.  Dispense: 14 tablet; Refill: 0     Assessment & Plan Acute upper respiratory infection Persistent cough and congestion for 1-2 weeks, likely due to an upper respiratory infection. Previous bilateral pneumonia in December, but current symptoms are localized to the upper respiratory tract. Lungs are clear on examination, indicating no lower respiratory involvement. Discussed potential for sinusitis and the use of antibiotics as a precautionary measure. - Use saline nasal spray and Flonase  for nasal congestion. - Run a humidifier in the room. - Increase fluid intake to help loosen mucus. - Prescribed amoxicillin  875 mg twice daily for 5-7 days as a precautionary measure, to be used if symptoms worsen or if sinus pressure or tooth pain develops.     F/up prn     Subjective:    Chief Complaint  Patient presents with   Follow-up    Feels better but not great. Husband sent PCP the message. Her family thinks she is doing to much and not taking care of her self. No fevers. Does have a head cold. Friend thinks that because she has a head cold that the pneumonia is not gone. Used rescue inhaler twice this morning and daily inhaler.     HPI Discussed the use of AI scribe software for clinical note transcription with the patient, who gave verbal consent to proceed.  History of Present Illness Teresa Rangel is an 83 year old female who presents with continued cough, congestion, and head cold symptoms.  She has been experiencing symptoms of cough, congestion, and head cold for one to two weeks. She recalls a previous episode of bilateral pneumonia diagnosed via chest x-ray on September 02, 2024, which led to an emergency department visit on September 07, 2024.  During that visit, she received intravenous steroids and was discharged with prednisone  tablets. A chest x-ray at that time was normal. She had difficulty coughing up phlegm and experienced breathing difficulties, prompting the ER visit. Post-treatment, she felt better but subsequently developed a head cold after celebrating Christmas in January.  Her family is concerned about her self-care, noting she forgets to take medications like Sudafed, which her husband bought for her. She has been using a rescue inhaler and is currently on prednisone  20 mg. She also uses Benadryl for sleep and benzonatate  for cough. Hydration is part of her regimen.  She mentions her husband's health issues, including a kidney infection, which has been a source of stress for her. 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  Her phlegm is coming from her head and upper respiratory area, not from the lower lungs.     Past Medical History:  Diagnosis Date   Allergy    History of stomach ulcers 1980   Hyperlipidemia    Hypertension    Hypothyroidism    Macular degeneration of left eye    OSA (obstructive sleep apnea)    mild obstructive sleep apnea with an AHI of 10/hr.  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   Benign     Past Surgical History:  Procedure Laterality Date   APPENDECTOMY  1954   COLONOSCOPY     LAPAROSCOPY  1982   THYROIDECTOMY  2007   TONSILLECTOMY  1949    Family History  Problem Relation Age of Onset   Colon cancer Sister    Breast cancer Maternal Aunt 50   Stomach cancer Neg Hx    Rectal cancer Neg Hx    Esophageal cancer Neg Hx     Social History[1]   Allergies[2]  Review of Systems NEGATIVE UNLESS  OTHERWISE INDICATED IN HPI      Objective:     BP 132/68 (BP Location: Right Arm, Patient Position: Sitting, Cuff Size: Normal)   Pulse 60   Temp (!) 97.2 F (36.2 C) (Temporal)   Ht 5' 4 (1.626 m)   Wt 160 lb (72.6 kg)   LMP  (LMP Unknown)   SpO2 96%   BMI 27.46 kg/m   Wt Readings from Last 3 Encounters:  10/12/24 160 lb (72.6 kg)  09/14/24 158 lb 6.4 oz (71.8 kg)  09/07/24 156 lb 8.4 oz (71 kg)    BP Readings from Last 3 Encounters:  10/12/24 132/68  09/14/24 102/60  09/07/24 133/64     Physical Exam Vitals and nursing note reviewed.  Constitutional:      General: She is not in acute distress.    Appearance: Normal appearance. She is not ill-appearing.  HENT:     Head: Normocephalic.     Right Ear: Tympanic membrane, ear canal and external ear normal.     Left Ear: Tympanic membrane, ear canal and external ear normal.     Nose: Congestion present.     Mouth/Throat:     Mouth: Mucous membranes are moist.     Pharynx: No oropharyngeal exudate or posterior oropharyngeal erythema.  Eyes:     Extraocular Movements: Extraocular movements intact.     Conjunctiva/sclera: Conjunctivae normal.     Pupils: Pupils are equal, round, and reactive to light.  Cardiovascular:     Rate and Rhythm: Normal rate and regular rhythm.     Pulses: Normal pulses.     Heart sounds: Normal heart sounds. No murmur heard. Pulmonary:     Effort: Pulmonary effort is normal. No respiratory distress.     Breath sounds: Normal breath sounds. No wheezing.  Musculoskeletal:     Cervical back: Normal range of motion.  Skin:    General: Skin is warm.  Neurological:     Mental Status: She is alert and oriented to person, place, and time.  Psychiatric:        Mood and Affect: Mood normal.        Behavior: Behavior normal.             Junelle Hashemi M Reannah Totten, PA-C    [1]  Social History Tobacco Use   Smoking status: Never   Smokeless tobacco: Never  Vaping Use   Vaping status:  Never Used  Substance Use Topics   Alcohol use: Not Currently    Alcohol/week: 1.0 standard drink of alcohol    Types: 1 Glasses of wine per week    Comment: occassional   Drug use: No  [2]  Allergies Allergen Reactions   Molds & Smuts Cough    Other reaction(s): cough   Tetracyclines & Related  Other (See Comments)    Stomach cramps   "

## 2024-10-12 NOTE — Telephone Encounter (Signed)
 Please see pt msg and concerns and advise recommendations and next steps

## 2024-10-12 NOTE — Telephone Encounter (Signed)
 Pt called and scheduled for this morning per PCP request and approval

## 2024-10-17 ENCOUNTER — Ambulatory Visit: Admitting: Internal Medicine

## 2024-10-17 ENCOUNTER — Encounter: Payer: Self-pay | Admitting: Internal Medicine

## 2024-10-17 VITALS — BP 122/68 | HR 72 | Ht 64.0 in | Wt 161.0 lb

## 2024-10-17 DIAGNOSIS — R140 Abdominal distension (gaseous): Secondary | ICD-10-CM | POA: Diagnosis not present

## 2024-10-17 DIAGNOSIS — R143 Flatulence: Secondary | ICD-10-CM

## 2024-10-17 DIAGNOSIS — K6289 Other specified diseases of anus and rectum: Secondary | ICD-10-CM

## 2024-10-17 DIAGNOSIS — K644 Residual hemorrhoidal skin tags: Secondary | ICD-10-CM | POA: Diagnosis not present

## 2024-10-17 DIAGNOSIS — K8050 Calculus of bile duct without cholangitis or cholecystitis without obstruction: Secondary | ICD-10-CM

## 2024-10-17 NOTE — Patient Instructions (Addendum)
" °  VISIT SUMMARY: Today, we discussed your ongoing issues with external hemorrhoids, anal sphincter weakness, and intestinal gas and bloating. We reviewed your recent colonoscopy results and your upcoming cholecystectomy.  YOUR PLAN: EXTERNAL HEMORRHOIDS: You have chronic external hemorrhoids causing persistent burning pain that has not improved with over-the-counter treatments. -Try using RectiCare (5% lidocaine topical anesthetic) for relief. -Consider store brand lidocaine alternatives for cost savings.  ANAL SPHINCTER WEAKNESS: You have chronic decreased control of your anal sphincter. -No new interventions are needed at this time. -We reviewed your prior referral to pelvic floor therapy.  INTESTINAL GAS AND BLOATING: You have ongoing gas and bloating, especially after eating certain foods. -Try Beano with gas-producing foods like beans and peas. -I provided samples of FODZYME to help manage your symptoms, also. This can be purchased on [employer] if desired.  I appreciate the opportunity to care for you. Lupita Commander, MD, [facility]   "

## 2024-10-17 NOTE — Progress Notes (Signed)
 "       Teresa Rangel 84 y.o. Apr 23, 1941 [id number]  Assessment & Plan:   Encounter Diagnoses  Name Primary?   External hemorrhoids with complication Yes   Bloating    Flatulence    Biliary colic    Chronic, symptomatic external hemorrhoids with persistent burning pain refractory to multiple over-the-counter topical agents. - Trial of RectiCare (5% lidocaine topical anesthetic) for symptomatic relief. - Provided information on store brand lidocaine alternatives for cost savings.  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 - Reviewed prior referral to pelvic floor therapy.  Intestinal gas and bloating Persistent intestinal gas and bloating triggered by specific dietary factors despite dietary modification. - Recommended Beano with gas-producing foods such as beans and peas. - Provided samples of FODZYME for symptomatic management of gas and bloating.  She plans to cancel cholecystectomy and observe going forward and modifying diet.  She did not have stones on the ultrasound though it certainly sounded like she had biliary colic.  We reviewed the pros and cons.  She has been well for several months and wants to observe which I think is reasonable, as she does understand the potential problems without cholecystectomy.  --------------------------------------------------------------------------------------  Chief Complaint: Hemorrhoids, review colonoscopy findings, bloating and gas  HPI Discussed the use of AI scribe software for clinical note transcription with the patient, who gave verbal consent to proceed.   Teresa Rangel is an 84 year old female with colonic polyps and external hemorrhoids who presents for follow-up after colonoscopy and ongoing management of hemorrhoidal symptoms.  Colorectal Symptoms: - Persistent external hemorrhoidal symptoms characterized by severe burning and discomfort - Burning described  as intense and unrelenting - Minimal and short-lived relief with over-the-counter treatments including Vaseline, Neosporin, and hydrocortisone  cream - Hemorrhoidal symptoms have remained unchanged over time - Colonoscopy in August revealed five small polyps, which were removed, and large external hemorrhoids - Underwent colonoscopy due to advanced age and family history of colon cancer (sister deceased from colon cancer)  Anal Sphincter Dysfunction: - Significant anal sphincter laxity and weakness resulting in decreased control - Requires caution during bowel movements - Did not complete pelvic floor therapy due to husband's illness and her own pneumonia  Biliary Colic and Gallbladder Pathology: - Two episodes of severe pain following lasagna consumption in November - Emergency room evaluation identified gallbladder sludge without stones - No further episodes of gallbladder pain since November - Scheduled for cholecystectomy and remains in contact with surgical team regarding planned procedure-anticipates canceling this and observing without surgery  Intestinal Gas and Bloating: - Ongoing excessive intestinal gas and bloating, particularly after consuming peas or beans - Frequent flatulence described as just like a motorboat and pooter, pooter everywhere - Avoidance of certain foods has reduced symptoms - Previous reflux and bloating improved with dietary modifications      Colonoscopy 04/17/2024: - Hemorrhoids found on perianal exam. (Large external) - Decreased voluntary squeeze, rectocele and exagerated descent found on digital rectal exam. - Five 2 to 7 mm polyps in the sigmoid colon, in the descending colon and in the transverse colon, removed with a cold snare. Resected and retrieved. - Internal hemorrhoids. Also see banding scars. - Diverticulosis in the sigmoid colon. - The examination was otherwise normal on direct and retroflexion views. 3 adenomas 2 hpp  Allergies[1] Active  Medications[2] Past Medical History:  Diagnosis Date   Allergy    History of stomach ulcers 1980   Hyperlipidemia  Hypertension    Hypothyroidism    Macular degeneration of left eye    OSA (obstructive sleep apnea)    mild obstructive sleep apnea with an AHI of 10/hr.  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 Right   Are you currently employed ?    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 husband   Who lives with you?    What type of home do you live in: 1 story or 2 story? two       family history includes Breast cancer (age of onset: 39) in her maternal aunt; Colon cancer in her sister.   Review of Systems As per HPI  Objective:   Physical Exam BP 122/68   Pulse 72   Ht 5' 4 (1.626 m)   Wt 161 lb (73 kg)   LMP  (LMP Unknown)   BMI 27.64 kg/m      [1]  Allergies Allergen Reactions   Molds & Smuts Cough    Other reaction(s): cough   Tetracyclines & Related Other (See Comments)    Stomach cramps  [2]  Current Meds  Medication Sig   albuterol  (VENTOLIN  HFA) 108 (90 Base) MCG/ACT inhaler Inhale 1-2 puffs into the lungs every 6 (six) hours as needed for wheezing or shortness of breath.   amLODipine  (NORVASC ) 10 MG tablet TAKE 1 TABLET BY MOUTH DAILY   BREO ELLIPTA  200-25 MCG/ACT AEPB INHALE 1 PUFF BY MOUTH DAILY   carvedilol  (COREG ) 6.25 MG tablet TAKE 1 TABLET BY MOUTH 2 TIMES A DAY   FLUoxetine  (PROZAC ) 40 MG capsule TAKE 1 CAPSULE BY MOUTH DAILY   fluticasone  (FLONASE ) 50 MCG/ACT  nasal spray Place 2 sprays into both nostrils daily.   hydrochlorothiazide  (HYDRODIURIL ) 25 MG tablet Take 25 mg by mouth daily.   hydrocortisone  (ANUSOL -HC) 2.5 % rectal cream Place 1 Application rectally 3 (three) times daily.   irbesartan  (AVAPRO ) 75 MG tablet Take 1 tablet (75 mg total) by mouth daily.   levothyroxine  (SYNTHROID ) 88 MCG tablet TAKE 1 TABLET BY MOUTH DAILY BEFORE BREAKFAST   Multiple Vitamins-Minerals (PRESERVISION AREDS 2 PO) Take 1 tablet by mouth daily.   pravastatin  (PRAVACHOL ) 80 MG tablet TAKE 1 TABLET BY MOUTH EVERY EVENING   TURMERIC PO Take 1 tablet by mouth daily.   "

## 2024-11-02 ENCOUNTER — Ambulatory Visit (HOSPITAL_COMMUNITY): Admit: 2024-11-02 | Admitting: General Surgery

## 2025-01-24 ENCOUNTER — Ambulatory Visit: Admitting: Physician Assistant

## 2025-02-19 ENCOUNTER — Ambulatory Visit: Admitting: Cardiology
